# Patient Record
Sex: Female | Born: 1980 | Race: Black or African American | Hispanic: No | Marital: Single | Smoking: Never smoker
Health system: Southern US, Community
[De-identification: ages and names within clinical notes are randomized; demographics above are authoritative.]

## PROBLEM LIST (undated history)

## (undated) DIAGNOSIS — G629 Polyneuropathy, unspecified: Secondary | ICD-10-CM

## (undated) DIAGNOSIS — R000 Tachycardia, unspecified: Secondary | ICD-10-CM

## (undated) DIAGNOSIS — Q2730 Arteriovenous malformation, site unspecified: Secondary | ICD-10-CM

## (undated) DIAGNOSIS — E119 Type 2 diabetes mellitus without complications: Secondary | ICD-10-CM

## (undated) DIAGNOSIS — J189 Pneumonia, unspecified organism: Secondary | ICD-10-CM

## (undated) DIAGNOSIS — Z87442 Personal history of urinary calculi: Secondary | ICD-10-CM

## (undated) DIAGNOSIS — I499 Cardiac arrhythmia, unspecified: Secondary | ICD-10-CM

## (undated) DIAGNOSIS — D649 Anemia, unspecified: Secondary | ICD-10-CM

## (undated) HISTORY — PX: TUBAL LIGATION: SHX77

## (undated) HISTORY — PX: BREAST SURGERY: SHX581

## (undated) HISTORY — PX: ESOPHAGOGASTRODUODENOSCOPY: SHX1529

## (undated) HISTORY — PX: ABDOMINAL SURGERY: SHX537

## (undated) NOTE — *Deleted (*Deleted)
  81 y.o. female presents to skilled physical therapy due to RLE weakness. Patient's symptoms started June/July 2020 and started intermittently but are not constant. Symptoms include right leg weakness and paresthesias in both legs. She describes her sensory symptoms as burning, tingling, pin and needles, weakness, numbness, aching, stabbing, and pain. She feels that the numbness begins below her right knee and runs along the medial portion of her calf and ends at her toes. She reports that the numbness has been worsening over the last few weeks. She went to the ED on 11/05/2019 due to these symptoms and she was prescribed a steroid taper. Since that visit on May 7, she feels that she has been having to "drag" her right leg while walking, and also that she is afraid to walk due to having "no" sensation to her right foot. In May 2021 she had periods of urinary incontinence. She also reports that she did not feel toilet paper when she is wiping after urinating, prompting her to have to get into the shower on more than one occasion to ensure that she is clean. Denies any bowel dysfunction. Today in office patient reports that urinary incontinence and decreased urinary sensation. Patient has tried gabapentin for her symptoms. Patient denies being diagnosed wit hypothyroidism, renal failure, any known nutritional deficiencies, or any autoimmunes diseases. MRI conducted on 05/14/20 is positive for mild disc herniations at C3-4, C4-5, and C5-6 resulting in mild  spinal stenosis.

---

## 2004-04-11 ENCOUNTER — Emergency Department: Payer: Self-pay | Admitting: General Practice

## 2004-04-11 ENCOUNTER — Other Ambulatory Visit: Payer: Self-pay

## 2004-06-29 ENCOUNTER — Emergency Department: Payer: Self-pay | Admitting: Emergency Medicine

## 2004-10-23 ENCOUNTER — Emergency Department: Payer: Self-pay | Admitting: Emergency Medicine

## 2004-10-24 ENCOUNTER — Emergency Department: Payer: Self-pay | Admitting: Emergency Medicine

## 2005-01-12 ENCOUNTER — Emergency Department: Payer: Self-pay | Admitting: Unknown Physician Specialty

## 2005-02-06 ENCOUNTER — Emergency Department: Payer: Self-pay | Admitting: Emergency Medicine

## 2005-05-18 ENCOUNTER — Emergency Department: Payer: Self-pay | Admitting: Emergency Medicine

## 2005-08-03 ENCOUNTER — Emergency Department: Payer: Self-pay | Admitting: Unknown Physician Specialty

## 2007-04-03 ENCOUNTER — Emergency Department: Payer: Self-pay | Admitting: Emergency Medicine

## 2007-07-10 ENCOUNTER — Emergency Department: Payer: Self-pay | Admitting: Internal Medicine

## 2007-11-28 ENCOUNTER — Emergency Department: Payer: Self-pay | Admitting: Unknown Physician Specialty

## 2007-12-22 ENCOUNTER — Emergency Department: Payer: Self-pay | Admitting: Internal Medicine

## 2008-03-21 ENCOUNTER — Emergency Department: Payer: Self-pay | Admitting: Emergency Medicine

## 2008-05-05 ENCOUNTER — Emergency Department: Payer: Self-pay | Admitting: Internal Medicine

## 2008-08-01 ENCOUNTER — Encounter (INDEPENDENT_AMBULATORY_CARE_PROVIDER_SITE_OTHER): Payer: Self-pay | Admitting: Specialist

## 2008-08-01 ENCOUNTER — Ambulatory Visit (HOSPITAL_BASED_OUTPATIENT_CLINIC_OR_DEPARTMENT_OTHER): Admission: RE | Admit: 2008-08-01 | Discharge: 2008-08-02 | Payer: Self-pay | Admitting: Specialist

## 2009-02-04 ENCOUNTER — Emergency Department: Payer: Self-pay | Admitting: Emergency Medicine

## 2009-02-20 ENCOUNTER — Emergency Department: Payer: Self-pay | Admitting: Emergency Medicine

## 2009-03-22 ENCOUNTER — Emergency Department: Payer: Self-pay | Admitting: Emergency Medicine

## 2009-06-29 ENCOUNTER — Emergency Department: Payer: Self-pay | Admitting: Emergency Medicine

## 2009-07-31 ENCOUNTER — Emergency Department: Payer: Self-pay | Admitting: Emergency Medicine

## 2009-09-17 ENCOUNTER — Emergency Department: Payer: Self-pay | Admitting: Emergency Medicine

## 2009-10-23 ENCOUNTER — Emergency Department: Payer: Self-pay | Admitting: Internal Medicine

## 2009-12-22 ENCOUNTER — Emergency Department: Payer: Self-pay | Admitting: Emergency Medicine

## 2010-06-07 ENCOUNTER — Emergency Department: Payer: Self-pay

## 2010-07-30 ENCOUNTER — Emergency Department: Payer: Self-pay | Admitting: Emergency Medicine

## 2010-10-16 LAB — POCT HEMOGLOBIN-HEMACUE: Hemoglobin: 10.2 g/dL — ABNORMAL LOW (ref 12.0–15.0)

## 2010-11-13 NOTE — Op Note (Signed)
Mary Velasquez, Mary Velasquez                ACCOUNT NO.:  [id number]   MEDICAL RECORD NO.:  [id number]          PATIENT TYPE:  AMB   LOCATION:  [facility]                          FACILITY:  [facility]   PHYSICIAN:  Earvin Hansen L. Truesdale, M.D.DATE OF BIRTH:  05-May-1981   DATE OF PROCEDURE:  DATE OF DISCHARGE:                               OPERATIVE REPORT   A 30 year old lady with severe macromastia, back and shoulder pain,  secondary to large pendulous breasts with intertriginous changes,  resistant to conservative treatment, history of accessory breast tissue  bilaterally.   PROCEDURES:  Bilateral breast reductions using the inferior pedicle  reduction mammoplasty.   ANESTHESIA:  General.   DESCRIPTION OF PROCEDURE:  Preoperatively, the patient was set up and  drawn for the inferior pedicle reduction mammoplasty, re-marked the  nipple-areola complexes to 22 cm from 38 both sides.  She underwent  general anesthesia, intubated orally.  Prep was done to the chest,  breast areas in a routine fashion using Hibiclens soap and solution,  walled off with sterile towels and drapes so as to make a sterile field.  A 0.25% Xylocaine with epinephrine was injected, 150 mL per side  1:400,000 concentration.  The wounds were scored with #15 blade.  The  skin over the inferior pedicle was de-epithelialized with #20 blade.  Medial and lateral fatty dermal pedicles were excised underlying  pectoralis major fascia.  Laterally more breast tissue was removed and  accessory in nature.  After proper hemostasis, the new keyhole area was  debulked, flaps were transposed and stayed with 3-0 Prolene.  Subcutaneous closure was done with 3-0 Monocryl x2 layers, then run the  subcuticular stitch with 3-0 Monocryl and 5-0 Monocryl throughout the  inverted T.  The wounds were drained with #10 Foley fluted Blake drain,  which was placed in the side of the depths of the lateral portion  incision and secured with 3-0 Prolene.  The  wounds were cleansed.  The  nipple-areola complexes were examined, showing good suppleness and blood  supply.  The dressing were then placed Xeroform, 4 x 4s, ABDs, and  hyperfix tape.  She withstood the procedures very well.   ESTIMATED BLOOD LOSS:  200 mL.   COMPLICATIONS:  None.   We removed over 1400 g on the right side and over 1100 g on the left.  She was then taken to recovery in good condition.      Yaakov Guthrie. Shon Hough, M.D.  Electronically Signed     GLT/MEDQ  D:  08/01/2008  T:  08/02/2008  Job:  [id number]

## 2011-02-13 ENCOUNTER — Emergency Department: Payer: Self-pay | Admitting: *Deleted

## 2011-04-09 ENCOUNTER — Emergency Department: Payer: Self-pay | Admitting: Emergency Medicine

## 2011-08-11 ENCOUNTER — Emergency Department: Payer: Self-pay | Admitting: Internal Medicine

## 2013-11-11 ENCOUNTER — Emergency Department: Payer: Self-pay | Admitting: Emergency Medicine

## 2013-11-11 LAB — CBC WITH DIFFERENTIAL/PLATELET
Basophil #: 0 10*3/uL (ref 0.0–0.1)
Basophil %: 0.8 %
EOS ABS: 0.1 10*3/uL (ref 0.0–0.7)
Eosinophil %: 1.5 %
HCT: 37.3 % (ref 35.0–47.0)
HGB: 12.3 g/dL (ref 12.0–16.0)
LYMPHS PCT: 26.6 %
Lymphocyte #: 1.6 10*3/uL (ref 1.0–3.6)
MCH: 25.7 pg — AB (ref 26.0–34.0)
MCHC: 33 g/dL (ref 32.0–36.0)
MCV: 78 fL — AB (ref 80–100)
MONO ABS: 0.6 x10 3/mm (ref 0.2–0.9)
Monocyte %: 9.1 %
NEUTROS ABS: 3.8 10*3/uL (ref 1.4–6.5)
NEUTROS PCT: 62 %
PLATELETS: 211 10*3/uL (ref 150–440)
RBC: 4.8 10*6/uL (ref 3.80–5.20)
RDW: 16 % — ABNORMAL HIGH (ref 11.5–14.5)
WBC: 6.1 10*3/uL (ref 3.6–11.0)

## 2013-11-11 LAB — URINALYSIS, COMPLETE
BLOOD: NEGATIVE
Bacteria: NONE SEEN
Bilirubin,UR: NEGATIVE
Glucose,UR: >=500 mg/dL (ref 0–75)
Ketone: NEGATIVE
Leukocyte Esterase: NEGATIVE
Nitrite: NEGATIVE
PROTEIN: NEGATIVE
Ph: 5 (ref 4.5–8.0)
RBC, UR: NONE SEEN /HPF (ref 0–5)
Specific Gravity: 1.032 (ref 1.003–1.030)
WBC UR: 1 /HPF (ref 0–5)

## 2013-11-11 LAB — COMPREHENSIVE METABOLIC PANEL
ALK PHOS: 101 U/L
Albumin: 3.1 g/dL — ABNORMAL LOW (ref 3.4–5.0)
Anion Gap: 6 — ABNORMAL LOW (ref 7–16)
BILIRUBIN TOTAL: 0.2 mg/dL (ref 0.2–1.0)
BUN: 9 mg/dL (ref 7–18)
CALCIUM: 9 mg/dL (ref 8.5–10.1)
Chloride: 104 mmol/L (ref 98–107)
Co2: 27 mmol/L (ref 21–32)
Creatinine: 0.62 mg/dL (ref 0.60–1.30)
EGFR (African American): >60
EGFR (Non-African Amer.): >60
GLUCOSE: 284 mg/dL — AB (ref 65–99)
Osmolality: 283 (ref 275–301)
Potassium: 3.9 mmol/L (ref 3.5–5.1)
SGOT(AST): 8 U/L — ABNORMAL LOW (ref 15–37)
SGPT (ALT): 15 U/L (ref 12–78)
SODIUM: 137 mmol/L (ref 136–145)
TOTAL PROTEIN: 7.9 g/dL (ref 6.4–8.2)

## 2013-11-11 LAB — LIPASE, BLOOD: Lipase: 167 U/L (ref 73–393)

## 2014-03-22 ENCOUNTER — Emergency Department: Payer: Self-pay | Admitting: Emergency Medicine

## 2014-03-22 LAB — URINALYSIS, COMPLETE
Bacteria: NONE SEEN
Bilirubin,UR: NEGATIVE
Blood: NEGATIVE
LEUKOCYTE ESTERASE: NEGATIVE
Nitrite: NEGATIVE
Ph: 5 (ref 4.5–8.0)
Protein: 25
RBC,UR: 2 /HPF (ref 0–5)
Specific Gravity: 1.025 (ref 1.003–1.030)

## 2014-03-22 LAB — COMPREHENSIVE METABOLIC PANEL
ALK PHOS: 83 U/L
ALT: 13 U/L — AB
Albumin: 3 g/dL — ABNORMAL LOW (ref 3.4–5.0)
Anion Gap: 8 (ref 7–16)
BUN: 8 mg/dL (ref 7–18)
Bilirubin,Total: 0.3 mg/dL (ref 0.2–1.0)
CHLORIDE: 105 mmol/L (ref 98–107)
CO2: 24 mmol/L (ref 21–32)
CREATININE: 0.64 mg/dL (ref 0.60–1.30)
Calcium, Total: 8.7 mg/dL (ref 8.5–10.1)
EGFR (African American): >60
EGFR (Non-African Amer.): >60
Glucose: 250 mg/dL — ABNORMAL HIGH (ref 65–99)
OSMOLALITY: 281 (ref 275–301)
Potassium: 3.5 mmol/L (ref 3.5–5.1)
SGOT(AST): 10 U/L — ABNORMAL LOW (ref 15–37)
SODIUM: 137 mmol/L (ref 136–145)
TOTAL PROTEIN: 7.1 g/dL (ref 6.4–8.2)

## 2014-03-22 LAB — CBC WITH DIFFERENTIAL/PLATELET
BASOS ABS: 0 10*3/uL (ref 0.0–0.1)
Basophil %: 0.6 %
EOS ABS: 0.1 10*3/uL (ref 0.0–0.7)
Eosinophil %: 1.9 %
HCT: 35.9 % (ref 35.0–47.0)
HGB: 11.2 g/dL — ABNORMAL LOW (ref 12.0–16.0)
LYMPHS ABS: 2.3 10*3/uL (ref 1.0–3.6)
Lymphocyte %: 31.9 %
MCH: 24.3 pg — AB (ref 26.0–34.0)
MCHC: 31.2 g/dL — ABNORMAL LOW (ref 32.0–36.0)
MCV: 78 fL — ABNORMAL LOW (ref 80–100)
MONOS PCT: 10 %
Monocyte #: 0.7 x10 3/mm (ref 0.2–0.9)
NEUTROS ABS: 3.9 10*3/uL (ref 1.4–6.5)
Neutrophil %: 55.6 %
PLATELETS: 222 10*3/uL (ref 150–440)
RBC: 4.6 10*6/uL (ref 3.80–5.20)
RDW: 14.9 % — AB (ref 11.5–14.5)
WBC: 7.1 10*3/uL (ref 3.6–11.0)

## 2014-03-22 LAB — TROPONIN I

## 2014-03-22 LAB — GC/CHLAMYDIA PROBE AMP

## 2014-03-22 LAB — WET PREP, GENITAL

## 2014-03-22 LAB — LIPASE, BLOOD: LIPASE: 113 U/L (ref 73–393)

## 2015-06-22 ENCOUNTER — Emergency Department
Admission: EM | Admit: 2015-06-22 | Discharge: 2015-06-22 | Disposition: A | Discharge status: Home / Self Care | Payer: Self-pay | Attending: Emergency Medicine | Admitting: Emergency Medicine

## 2015-06-22 ENCOUNTER — Encounter: Payer: Self-pay | Admitting: Emergency Medicine

## 2015-06-22 DIAGNOSIS — M5432 Sciatica, left side: Secondary | ICD-10-CM

## 2015-06-22 DIAGNOSIS — E119 Type 2 diabetes mellitus without complications: Secondary | ICD-10-CM | POA: Insufficient documentation

## 2015-06-22 DIAGNOSIS — M5442 Lumbago with sciatica, left side: Secondary | ICD-10-CM | POA: Insufficient documentation

## 2015-06-22 HISTORY — DX: Type 2 diabetes mellitus without complications: E11.9

## 2015-06-22 MED ORDER — TRAMADOL HCL 50 MG PO TABS: 50.0000 mg | ORAL_TABLET | Freq: Four times a day (QID) | ORAL | Status: DC | PRN

## 2015-06-22 MED ORDER — METHYLPREDNISOLONE 4 MG PO TBPK: ORAL_TABLET | ORAL | Status: DC

## 2015-06-22 MED ORDER — METHOCARBAMOL 750 MG PO TABS: 1500.0000 mg | ORAL_TABLET | Freq: Four times a day (QID) | ORAL | Status: DC

## 2015-06-22 NOTE — ED Notes (Signed)
Pt with lef lower back and leg pain for two mths, thinks it may be her sciatica.

## 2015-06-22 NOTE — ED Provider Notes (Signed)
[facility] Emergency Department Provider Note  ____________________________________________  Time seen: Approximately 6:01 PM  I have reviewed the triage vital signs and the nursing notes.   HISTORY  Chief Complaint Leg Pain    HPI Mary Velasquez is a 34 y.o. female patient complain intermitting radicular low back pain to the left lower extremity for 2 months but worsened in the last 6 days.. Patient states denies any bladder or bowel dysfunction. 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 Patient stated there is no definitive provocative incident for this complaint. No palliative measures taken for this complaint.She does rate her pain as a 5/10. Patient described the pain as sharp progressing to intermitting numbness.   Past Medical History  Diagnosis Date  . Diabetes mellitus without complication (HCC)     There are no active problems to display for this patient.   Past Surgical History  Procedure Laterality Date  . Abdominal surgery    . Breast surgery      Current Outpatient Rx  Name  Route  Sig  Dispense  Refill  . methocarbamol (ROBAXIN-750) 750 MG tablet   Oral   Take 2 tablets (1,500 mg total) by mouth 4 (four) times daily.   40 tablet   0   . methylPREDNISolone (MEDROL DOSEPAK) 4 MG TBPK tablet      Take Tapered dose as directed   21 tablet   0   . traMADol (ULTRAM) 50 MG tablet   Oral   Take 1 tablet (50 mg total) by mouth every 6 (six) hours as needed for moderate pain.   12 tablet   0     Allergies Oxycodone  No family history on file.  Social History Social History  Substance Use Topics  . Smoking status: Never Smoker   . Smokeless tobacco: None  . Alcohol Use: No    Review of Systems Constitutional: No fever/chills Eyes: No visual changes. ENT: No sore throat. Cardiovascular: Denies chest pain. Respiratory: Denies shortness of breath. Gastrointestinal: No  abdominal pain.  No nausea, no vomiting.  No diarrhea.  No constipation. Genitourinary: Negative for dysuria. Musculoskeletal: Back pain  Skin: Negative for rash. Neurological: Negative for headaches, focal weakness or numbness. Endocrine:Diabetes but no medication secondary to weight loss that controlled his condition. Hematological/Lymphatic: Allergic/Immunilogical: Oxycodone  10-point ROS otherwise negative.  ____________________________________________   PHYSICAL EXAM:  VITAL SIGNS: ED Triage Vitals  Enc Vitals Group     BP 06/22/15 1750 129/82 mmHg     Pulse Rate 06/22/15 1750 100     Resp 06/22/15 1750 18     Temp 06/22/15 1750 98.2 F (36.8 C)     Temp Source 06/22/15 1750 Oral     SpO2 06/22/15 1750 98 %     Weight 06/22/15 1750 198 lb (89.812 kg)     Height 06/22/15 1750 5' (1.524 m)     Head Cir --      Peak Flow --      Pain Score 06/22/15 1753 5     Pain Loc --      Pain Edu? --      Excl. in GC? --     Constitutional: Alert and oriented. Well appearing and in no acute distress. Eyes: Conjunctivae are normal. PERRL. EOMI. Head: Atraumatic. Nose: No congestion/rhinnorhea. Mouth/Throat: Mucous membranes are moist.  Oropharynx non-erythematous. Neck: No stridor.  No cervical spine tenderness to palpation. Hematological/Lymphatic/Immunilogical: No cervical lymphadenopathy. Cardiovascular: Normal rate,  regular rhythm. Grossly normal heart sounds.  Good peripheral circulation. Respiratory: Normal respiratory effort.  No retractions. Lungs CTAB. Gastrointestinal: Soft and nontender. No distention. No abdominal bruits. No CVA tenderness. Musculoskeletal: No lower extremity tenderness nor edema.  No joint effusions. Neurologic:  Normal speech and language. No gross focal neurologic deficits are appreciated. No gait instability. Skin:  Skin is warm, dry and intact. No rash noted. Psychiatric: Mood and affect are normal. Speech and behavior are  normal.  ____________________________________________   LABS (all labs ordered are listed, but only abnormal results are displayed)  Labs Reviewed - No data to display ____________________________________________  EKG   ____________________________________________  RADIOLOGY   ____________________________________________   PROCEDURES  Procedure(s) performed: None  Critical Care performed: No  ____________________________________________   INITIAL IMPRESSION / ASSESSMENT AND PLAN / ED COURSE  Pertinent labs & imaging results that were available during my care of the patient were reviewed by me and considered in my medical decision making (see chart for details).  Radicular back pain. Patient discussion on home care. Patient get a prescription for prednisone, Robaxin, and tramadol. Patient advised follow-up with her family doctor one week ago and noticed no improvement. ____________________________________________   FINAL CLINICAL IMPRESSION(S) / ED DIAGNOSES  Final diagnoses:  Sciatica associated with disorder of lumbar spine, left      Joni Reining, PA-C 06/22/15 1821  Arnaldo Natal, MD 06/23/15 [phone]

## 2015-08-10 ENCOUNTER — Emergency Department
Admission: EM | Admit: 2015-08-10 | Discharge: 2015-08-10 | Disposition: A | Discharge status: Home / Self Care | Payer: Self-pay | Attending: Emergency Medicine | Admitting: Emergency Medicine

## 2015-08-10 ENCOUNTER — Encounter: Payer: Self-pay | Admitting: Emergency Medicine

## 2015-08-10 DIAGNOSIS — E1165 Type 2 diabetes mellitus with hyperglycemia: Secondary | ICD-10-CM | POA: Insufficient documentation

## 2015-08-10 DIAGNOSIS — A0811 Acute gastroenteropathy due to Norwalk agent: Secondary | ICD-10-CM | POA: Insufficient documentation

## 2015-08-10 DIAGNOSIS — Z3202 Encounter for pregnancy test, result negative: Secondary | ICD-10-CM | POA: Insufficient documentation

## 2015-08-10 LAB — LIPASE, BLOOD: LIPASE: 38 U/L (ref 11–51)

## 2015-08-10 LAB — COMPREHENSIVE METABOLIC PANEL
ALBUMIN: 3.7 g/dL (ref 3.5–5.0)
ALT: 13 U/L — ABNORMAL LOW (ref 14–54)
AST: 14 U/L — AB (ref 15–41)
Alkaline Phosphatase: 92 U/L (ref 38–126)
Anion gap: 7 (ref 5–15)
BUN: 11 mg/dL (ref 6–20)
CHLORIDE: 101 mmol/L (ref 101–111)
CO2: 27 mmol/L (ref 22–32)
Calcium: 9.1 mg/dL (ref 8.9–10.3)
Creatinine, Ser: 0.51 mg/dL (ref 0.44–1.00)
GFR calc Af Amer: >60 mL/min (ref >60)
GFR calc non Af Amer: >60 mL/min (ref >60)
GLUCOSE: 307 mg/dL — AB (ref 65–99)
POTASSIUM: 3.9 mmol/L (ref 3.5–5.1)
Sodium: 135 mmol/L (ref 135–145)
Total Bilirubin: 0.5 mg/dL (ref 0.3–1.2)
Total Protein: 7.8 g/dL (ref 6.5–8.1)

## 2015-08-10 LAB — URINALYSIS COMPLETE WITH MICROSCOPIC (ARMC ONLY)
BACTERIA UA: NONE SEEN
Bilirubin Urine: NEGATIVE
Glucose, UA: >500 mg/dL — AB
HGB URINE DIPSTICK: NEGATIVE
Ketones, ur: NEGATIVE mg/dL
NITRITE: NEGATIVE
PH: 6 (ref 5.0–8.0)
PROTEIN: NEGATIVE mg/dL
SPECIFIC GRAVITY, URINE: 1.027 (ref 1.005–1.030)

## 2015-08-10 LAB — CBC
HEMATOCRIT: 36.9 % (ref 35.0–47.0)
Hemoglobin: 11.9 g/dL — ABNORMAL LOW (ref 12.0–16.0)
MCH: 25.3 pg — AB (ref 26.0–34.0)
MCHC: 32.3 g/dL (ref 32.0–36.0)
MCV: 78.4 fL — AB (ref 80.0–100.0)
Platelets: 231 10*3/uL (ref 150–440)
RBC: 4.7 MIL/uL (ref 3.80–5.20)
RDW: 15.1 % — AB (ref 11.5–14.5)
WBC: 6.2 10*3/uL (ref 3.6–11.0)

## 2015-08-10 LAB — PREGNANCY, URINE: PREG TEST UR: NEGATIVE

## 2015-08-10 MED ORDER — METFORMIN HCL 500 MG PO TABS
500.0000 mg | ORAL_TABLET | Freq: Once | ORAL | Status: AC
  Administered 2015-08-10: 500 mg via ORAL

## 2015-08-10 MED ORDER — ONDANSETRON 4 MG PO TBDP
ORAL_TABLET | ORAL | Status: AC
  Administered 2015-08-10: 4 mg via ORAL
  Filled 2015-08-10: qty 1

## 2015-08-10 MED ORDER — METFORMIN HCL 500 MG PO TABS
ORAL_TABLET | ORAL | Status: AC
Start: 2015-08-10 — End: 2015-08-10
  Administered 2015-08-10: 500 mg via ORAL
  Filled 2015-08-10: qty 1

## 2015-08-10 MED ORDER — ONDANSETRON HCL 4 MG PO TABS: 4.0000 mg | ORAL_TABLET | Freq: Every day | ORAL | Status: DC | PRN

## 2015-08-10 MED ORDER — METFORMIN HCL 500 MG PO TABS: 500.0000 mg | ORAL_TABLET | Freq: Two times a day (BID) | ORAL | Status: DC

## 2015-08-10 MED ORDER — ONDANSETRON 4 MG PO TBDP
4.0000 mg | ORAL_TABLET | Freq: Once | ORAL | Status: AC
  Administered 2015-08-10: 4 mg via ORAL

## 2015-08-10 NOTE — ED Provider Notes (Signed)
[facility] Emergency Department Provider Note     Time seen: ----------------------------------------- 2:02 PM on 08/10/2015 -----------------------------------------    I have reviewed the triage vital signs and the nursing notes.   HISTORY  Chief Complaint Abdominal Pain    HPI Mary Velasquez is a 35 y.o. female who presents to ER with bilateral lower abdominal pain, nausea and vomiting since last night. Patient reports a fever 101.3 last night. She is also had diarrhea, she reports last taking Tylenol at 7:00 this morning. Patient does have diabetes but had stopped taking her medication after she lost a lot of weight, she denies any other complaints.   Past Medical History  Diagnosis Date  . Diabetes mellitus without complication (HCC)     There are no active problems to display for this patient.   Past Surgical History  Procedure Laterality Date  . Abdominal surgery    . Breast surgery    . Cesarean section    . Tubal ligation      Allergies Oxycodone  Social History Social History  Substance Use Topics  . Smoking status: Never Smoker   . Smokeless tobacco: None  . Alcohol Use: No    Review of Systems Constitutional: Positive for fever Eyes: Negative for visual changes. ENT: Negative for sore throat. Cardiovascular: Negative for chest pain. Respiratory: Negative for shortness of breath. Gastrointestinal: Positive for abdominal pain earlier, vomiting and diarrhea Genitourinary: Negative for dysuria. Musculoskeletal: Negative for back pain. Skin: Negative for rash. Neurological: Negative for headaches, focal weakness or numbness.  10-point ROS otherwise negative.  ____________________________________________   PHYSICAL EXAM:  VITAL SIGNS: ED Triage Vitals  Enc Vitals Group     BP 08/10/15 1123 146/113 mmHg     Pulse Rate 08/10/15 1123 86     Resp 08/10/15 1123 20     Temp 08/10/15 1123 97.8 F (36.6 C)     Temp  Source 08/10/15 1123 Oral     SpO2 08/10/15 1123 99 %     Weight 08/10/15 1123 198 lb (89.812 kg)     Height 08/10/15 1123 5' (1.524 m)     Head Cir --      Peak Flow --      Pain Score 08/10/15 1144 6     Pain Loc --      Pain Edu? --      Excl. in GC? --     Constitutional: Alert and oriented. Well appearing and in no distress. Eyes: Conjunctivae are normal. PERRL. Normal extraocular movements. ENT   Head: Normocephalic and atraumatic.   Nose: No congestion/rhinnorhea.   Mouth/Throat: Mucous membranes are moist.   Neck: No stridor. Cardiovascular: Normal rate, regular rhythm. Normal and symmetric distal pulses are present in all extremities. No murmurs, rubs, or gallops. Respiratory: Normal respiratory effort without tachypnea nor retractions. Breath sounds are clear and equal bilaterally. No wheezes/rales/rhonchi. Gastrointestinal: Soft and nontender. No distention. No abdominal bruits.  Musculoskeletal: Nontender with normal range of motion in all extremities. No joint effusions.  No lower extremity tenderness nor edema. Neurologic:  Normal speech and language. No gross focal neurologic deficits are appreciated. Speech is normal. No gait instability. Skin:  Skin is warm, dry and intact. No rash noted. Psychiatric: Mood and affect are normal. Speech and behavior are normal. Patient exhibits appropriate insight and judgment. ____________________________________________  ED COURSE:  Pertinent labs & imaging results that were available during my care of the patient were reviewed by me and considered in my  medical decision making (see chart for details). Patients in no acute distress, likely Norovirus. She has a benign exam. No abdominal tenderness. ____________________________________________    LABS (pertinent positives/negatives)  Labs Reviewed  COMPREHENSIVE METABOLIC PANEL - Abnormal; Notable for the following:    Glucose, Bld 307 (*)    AST 14 (*)    ALT 13  (*)    All other components within normal limits  CBC - Abnormal; Notable for the following:    Hemoglobin 11.9 (*)    MCV 78.4 (*)    MCH 25.3 (*)    RDW 15.1 (*)    All other components within normal limits  URINALYSIS COMPLETEWITH MICROSCOPIC ([facility] ONLY) - Abnormal; Notable for the following:    Color, Urine STRAW (*)    APPearance CLEAR (*)    Glucose, UA >500 (*)    Leukocytes, UA TRACE (*)    Squamous Epithelial / LPF 0-5 (*)    All other components within normal limits  LIPASE, BLOOD  PREGNANCY, URINE  ____________________________________________  FINAL ASSESSMENT AND PLAN  Norovirus, type 2 diabetes  Plan: Patient with labs and imaging as dictated above. Patient is exam is reassuring, labs are unremarkable except for hyperglycemia. I will restart her metformin and she'll be given Zofran as needed for nausea and vomiting.   Emily Filbert, MD   Emily Filbert, MD 08/10/15 [phone]

## 2015-08-10 NOTE — Discharge Instructions (Signed)
Hyperglycemia °Hyperglycemia occurs when the glucose (sugar) in your blood is too high. Hyperglycemia can happen for many reasons, but it most often happens to people who do not know they have diabetes or are not managing their diabetes properly.  °CAUSES  °Whether you have diabetes or not, there are other causes of hyperglycemia. Hyperglycemia can occur when you have diabetes, but it can also occur in other situations that you might not be as aware of, such as: °Diabetes °· If you have diabetes and are having problems controlling your blood glucose, hyperglycemia could occur because of some of the following reasons: °· Not following your meal plan. °· Not taking your diabetes medications or not taking it properly. °· Exercising less or doing less activity than you normally do. °· Being sick. °Pre-diabetes °· This cannot be ignored. Before people develop Type 2 diabetes, they almost always have "pre-diabetes." This is when your blood glucose levels are higher than normal, but not yet high enough to be diagnosed as diabetes. Research has shown that some long-term damage to the body, especially the heart and circulatory system, may already be occurring during pre-diabetes. If you take action to manage your blood glucose when you have pre-diabetes, you may delay or prevent Type 2 diabetes from developing. °Stress °· If you have diabetes, you may be "diet" controlled or on oral medications or insulin to control your diabetes. However, you may find that your blood glucose is higher than usual in the hospital whether you have diabetes or not. This is often referred to as "stress hyperglycemia." Stress can elevate your blood glucose. This happens because of hormones put out by the body during times of stress. If stress has been the cause of your high blood glucose, it can be followed regularly by your caregiver. That way he/she can make sure your hyperglycemia does not continue to get worse or progress to  diabetes. °Steroids °· Steroids are medications that act on the infection fighting system (immune system) to block inflammation or infection. One side effect can be a rise in blood glucose. Most people can produce enough extra insulin to allow for this rise, but for those who cannot, steroids make blood glucose levels go even higher. It is not unusual for steroid treatments to "uncover" diabetes that is developing. It is not always possible to determine if the hyperglycemia will go away after the steroids are stopped. A special blood test called an A1c is sometimes done to determine if your blood glucose was elevated before the steroids were started. °SYMPTOMS °· Thirsty. °· Frequent urination. °· Dry mouth. °· Blurred vision. °· Tired or fatigue. °· Weakness. °· Sleepy. °· Tingling in feet or leg. °DIAGNOSIS  °Diagnosis is made by monitoring blood glucose in one or all of the following ways: °· A1c test. This is a chemical found in your blood. °· Fingerstick blood glucose monitoring. °· Laboratory results. °TREATMENT  °First, knowing the cause of the hyperglycemia is important before the hyperglycemia can be treated. Treatment may include, but is not be limited to: °· Education. °· Change or adjustment in medications. °· Change or adjustment in meal plan. °· Treatment for an illness, infection, etc. °· More frequent blood glucose monitoring. °· Change in exercise plan. °· Decreasing or stopping steroids. °· Lifestyle changes. °HOME CARE INSTRUCTIONS  °· Test your blood glucose as directed. °· Exercise regularly. Your caregiver will give you instructions about exercise. Pre-diabetes or diabetes which comes on with stress is helped by exercising. °· Eat wholesome,   balanced meals. Eat often and at regular, fixed times. Your caregiver or nutritionist will give you a meal plan to guide your sugar intake.  Being at an ideal weight is important. If needed, losing as little as 10 to 15 pounds may help improve blood  glucose levels. SEEK MEDICAL CARE IF:   You have questions about medicine, activity, or diet.  You continue to have symptoms (problems such as increased thirst, urination, or weight gain). SEEK IMMEDIATE MEDICAL CARE IF:   You are vomiting or have diarrhea.  Your breath smells fruity.  You are breathing faster or slower.  You are very sleepy or incoherent.  You have numbness, tingling, or pain in your feet or hands.  You have chest pain.  Your symptoms get worse even though you have been following your caregiver's orders.  If you have any other questions or concerns.   This information is not intended to replace advice given to you by your health care provider. Make sure you discuss any questions you have with your health care provider.   Document Released: 12/11/2000 Document Revised: 09/09/2011 Document Reviewed: 02/21/2015 Elsevier Interactive Patient Education 2016 [employer].  Norovirus Infection A norovirus infection is caused by exposure to a virus in a group of similar viruses (noroviruses). This type of infection causes inflammation in your stomach and intestines (gastroenteritis). Norovirus is the most common cause of gastroenteritis. It also causes food poisoning. Anyone can get a norovirus infection. It spreads very easily (contagious). You can get it from contaminated food, water, surfaces, or other people. Norovirus is found in the stool or vomit of infected people. You can spread the infection as soon as you feel sick until 2 weeks after you recover.  Symptoms usually begin within 2 days after you become infected. Most norovirus symptoms affect the digestive system. CAUSES Norovirus infection is caused by contact with norovirus. You can catch norovirus if you:  Eat or drink something contaminated with norovirus.  Touch surfaces or objects contaminated with norovirus and then put your hand in your mouth.  Have direct contact with an infected person who has  symptoms.  Share food, drink, or utensils with someone with who is sick with norovirus. SIGNS AND SYMPTOMS Symptoms of norovirus may include:  Nausea.  Vomiting.  Diarrhea.  Stomach cramps.  Fever.  Chills.  Headache.  Muscle aches.  Tiredness. DIAGNOSIS Your health care provider may suspect norovirus based on your symptoms and physical exam. Your health care provider may also test a sample of your stool or vomit for the virus.  TREATMENT There is no specific treatment for norovirus. Most people get better without treatment in about 2 days. HOME CARE INSTRUCTIONS  Replace lost fluids by drinking plenty of water or rehydration fluids containing important minerals called electrolytes. This prevents dehydration. Drink enough fluid to keep your urine clear or pale yellow.  Do not prepare food for others while you are infected. Wait at least 3 days after recovering from the illness to do that. PREVENTION   Wash your hands often, especially after using the toilet or changing a diaper.  Wash fruits and vegetables thoroughly before preparing or serving them.  Throw out any food that a sick person may have touched.  Disinfect contaminated surfaces immediately after someone in the household has been sick. Use a bleach-based household cleaner.  Immediately remove and wash soiled clothes or sheets. SEEK MEDICAL CARE IF:  Your vomiting, diarrhea, and stomach pain is getting worse.  Your symptoms of norovirus  do not go away after 2-3 days. SEEK IMMEDIATE MEDICAL CARE IF:  You develop symptoms of dehydration that do not improve with fluid replacement. This may include:  Excessive sleepiness.  Lack of tears.  Dry mouth.  Dizziness when standing.  Weak pulse.   This information is not intended to replace advice given to you by your health care provider. Make sure you discuss any questions you have with your health care provider.   Document Released: 09/07/2002 Document  Revised: 07/08/2014 Document Reviewed: 11/25/2013 Elsevier Interactive Patient Education [employer]

## 2015-08-10 NOTE — ED Notes (Signed)
Pt reports bilateral lower abdominal pain, nausea, vomiting since last night. Reports fever 101.3 last night. Pt reports taking tylenol at 0700.

## 2017-02-14 DIAGNOSIS — Z79899 Other long term (current) drug therapy: Secondary | ICD-10-CM | POA: Insufficient documentation

## 2017-02-14 DIAGNOSIS — E1165 Type 2 diabetes mellitus with hyperglycemia: Secondary | ICD-10-CM | POA: Insufficient documentation

## 2017-02-14 DIAGNOSIS — Z7984 Long term (current) use of oral hypoglycemic drugs: Secondary | ICD-10-CM | POA: Insufficient documentation

## 2017-02-14 DIAGNOSIS — R609 Edema, unspecified: Secondary | ICD-10-CM | POA: Insufficient documentation

## 2017-02-14 NOTE — ED Triage Notes (Signed)
Pt in with co bilat lower leg edema since this, hx of the same a year ago and was put on lasix. No on lasix at this time, no other compliants, states edema improved since this am.

## 2017-02-15 ENCOUNTER — Emergency Department
Admission: EM | Admit: 2017-02-15 | Discharge: 2017-02-15 | Disposition: A | Discharge status: Home / Self Care | Payer: Self-pay | Attending: Emergency Medicine | Admitting: Emergency Medicine

## 2017-02-15 DIAGNOSIS — R609 Edema, unspecified: Secondary | ICD-10-CM

## 2017-02-15 DIAGNOSIS — R739 Hyperglycemia, unspecified: Secondary | ICD-10-CM

## 2017-02-15 LAB — COMPREHENSIVE METABOLIC PANEL
ALT: 11 U/L — ABNORMAL LOW (ref 14–54)
AST: 21 U/L (ref 15–41)
Albumin: 3.3 g/dL — ABNORMAL LOW (ref 3.5–5.0)
Alkaline Phosphatase: 80 U/L (ref 38–126)
Anion gap: 10 (ref 5–15)
BILIRUBIN TOTAL: 0.4 mg/dL (ref 0.3–1.2)
BUN: 17 mg/dL (ref 6–20)
CO2: 25 mmol/L (ref 22–32)
Calcium: 8.8 mg/dL — ABNORMAL LOW (ref 8.9–10.3)
Chloride: 103 mmol/L (ref 101–111)
Creatinine, Ser: 0.78 mg/dL (ref 0.44–1.00)
Glucose, Bld: 420 mg/dL — ABNORMAL HIGH (ref 65–99)
POTASSIUM: 3.7 mmol/L (ref 3.5–5.1)
Sodium: 138 mmol/L (ref 135–145)
TOTAL PROTEIN: 7.2 g/dL (ref 6.5–8.1)

## 2017-02-15 LAB — CBC
HCT: 35.2 % (ref 35.0–47.0)
Hemoglobin: 11.5 g/dL — ABNORMAL LOW (ref 12.0–16.0)
MCH: 26.2 pg (ref 26.0–34.0)
MCHC: 32.8 g/dL (ref 32.0–36.0)
MCV: 79.9 fL — ABNORMAL LOW (ref 80.0–100.0)
Platelets: 235 K/uL (ref 150–440)
RBC: 4.41 MIL/uL (ref 3.80–5.20)
RDW: 15 % — ABNORMAL HIGH (ref 11.5–14.5)
WBC: 7.4 K/uL (ref 3.6–11.0)

## 2017-02-15 MED ORDER — METFORMIN HCL 500 MG PO TABS: 500.0000 mg | ORAL_TABLET | Freq: Two times a day (BID) | ORAL | 2 refills | Status: DC

## 2017-02-15 MED ORDER — FUROSEMIDE 20 MG PO TABS: 20.0000 mg | ORAL_TABLET | Freq: Every day | ORAL | 0 refills | Status: DC

## 2017-02-15 NOTE — ED Provider Notes (Signed)
[facility] Emergency Department Provider Note  ____________________________________________   I have reviewed the triage vital signs and the nursing notes.   HISTORY  Chief Complaint Leg Swelling   History limited by: Not Limited   HPI Mary Velasquez is a 36 y.o. female who presents to the emergency department today because of concern for peripheral edema. The patient states that she has had issues with lower extremity swelling in the past. Recently it has gotten worse. It is accompanied by discomfort. By the time of my examination it has improved. The patient had a similar problem last year and was treated with lasix. Additionally the patient has a history of diabetes but states she is currently not taking any medication for it.    Past Medical History:  Diagnosis Date  . Diabetes mellitus without complication (HCC)     There are no active problems to display for this patient.   Past Surgical History:  Procedure Laterality Date  . ABDOMINAL SURGERY    . BREAST SURGERY    . CESAREAN SECTION    . TUBAL LIGATION      Prior to Admission medications   Medication Sig Start Date End Date Taking? Authorizing Provider  metFORMIN (GLUCOPHAGE) 500 MG tablet Take 1 tablet (500 mg total) by mouth 2 (two) times daily with a meal. 08/10/15   Emily Filbert, MD  methocarbamol (ROBAXIN-750) 750 MG tablet Take 2 tablets (1,500 mg total) by mouth 4 (four) times daily. 06/22/15   Joni Reining, PA-C  methylPREDNISolone (MEDROL DOSEPAK) 4 MG TBPK tablet Take Tapered dose as directed 06/22/15   Joni Reining, PA-C  ondansetron (ZOFRAN) 4 MG tablet Take 1 tablet (4 mg total) by mouth daily as needed for nausea or vomiting. 08/10/15   Emily Filbert, MD  traMADol (ULTRAM) 50 MG tablet Take 1 tablet (50 mg total) by mouth every 6 (six) hours as needed for moderate pain. 06/22/15   Joni Reining, PA-C    Allergies Oxycodone  No family history on  file.  Social History Social History  Substance Use Topics  . Smoking status: Never Smoker  . Smokeless tobacco: Not on file  . Alcohol use No    Review of Systems Constitutional: No fever/chills Eyes: No visual changes. ENT: No sore throat. Cardiovascular: Denies chest pain. Respiratory: Denies shortness of breath. Gastrointestinal: No abdominal pain.  No nausea, no vomiting.  No diarrhea.   Genitourinary: Negative for dysuria. Musculoskeletal: Positive for lower extremity swelling.  Skin: Negative for rash. Neurological: Negative for headaches, focal weakness or numbness.  ____________________________________________   PHYSICAL EXAM:  VITAL SIGNS: ED Triage Vitals  Enc Vitals Group     BP 02/14/17 2355 (!) 137/100     Pulse Rate 02/14/17 2355 (!) 103     Resp 02/14/17 2355 (!) 22     Temp 02/14/17 2355 98.5 F (36.9 C)     Temp Source 02/14/17 2355 Oral     SpO2 02/14/17 2355 100 %     Weight 02/14/17 2356 204 lb (92.5 kg)     Height --      Head Circumference --      Peak Flow --      Pain Score 02/14/17 2355 7   Constitutional: Alert and oriented. Well appearing and in no distress. Eyes: Conjunctivae are normal.  ENT   Head: Normocephalic and atraumatic.   Nose: No congestion/rhinnorhea.   Mouth/Throat: Mucous membranes are moist.   Neck: No stridor. Hematological/Lymphatic/Immunilogical:  No cervical lymphadenopathy. Cardiovascular: Normal rate, regular rhythm.  No murmurs, rubs, or gallops.  Respiratory: Normal respiratory effort without tachypnea nor retractions. Breath sounds are clear and equal bilaterally. No wheezes/rales/rhonchi. Gastrointestinal: Soft and non tender. No rebound. No guarding.  Genitourinary: Deferred Musculoskeletal: Normal range of motion in all extremities. Trace lower extremity edema.  Neurologic:  Normal speech and language. No gross focal neurologic deficits are appreciated.  Skin:  Skin is warm, dry and intact. No  rash noted. Psychiatric: Mood and affect are normal. Speech and behavior are normal. Patient exhibits appropriate insight and judgment.  ____________________________________________    LABS (pertinent positives/negatives)  Labs Reviewed  CBC - Abnormal; Notable for the following:       Result Value   Hemoglobin 11.5 (*)    MCV 79.9 (*)    RDW 15.0 (*)    All other components within normal limits  COMPREHENSIVE METABOLIC PANEL - Abnormal; Notable for the following:    Glucose, Bld 420 (*)    Calcium 8.8 (*)    Albumin 3.3 (*)    ALT 11 (*)    All other components within normal limits     ____________________________________________   EKG  None  ____________________________________________    RADIOLOGY  None  ____________________________________________   PROCEDURES  Procedures  ____________________________________________   INITIAL IMPRESSION / ASSESSMENT AND PLAN / ED COURSE  Pertinent labs & imaging results that were available during my care of the patient were reviewed by me and considered in my medical decision making (see chart for details).  Patient presented the emergency department today because of lower extremityswelling. Patient has trace edema.patient has had this in the past. Will plan on giving patient short course of Lasix. Discussed compression stockings. I did have a discussion with the patient about her blood sugar. Patient currently not on medications. Will plan on giving patient prescription for metformin.  ____________________________________________   FINAL CLINICAL IMPRESSION(S) / ED DIAGNOSES  Final diagnoses:  Peripheral edema  Hyperglycemia     Note: This dictation was prepared with Dragon dictation. Any transcriptional errors that result from this process are unintentional     Phineas Semen, MD 02/15/17 [phone]

## 2017-02-15 NOTE — Discharge Instructions (Signed)
Please seek medical attention for any high fevers, chest pain, shortness of breath, change in behavior, persistent vomiting, bloody stool or any other new or concerning symptoms.  

## 2017-12-19 ENCOUNTER — Emergency Department
Admission: EM | Admit: 2017-12-19 | Discharge: 2017-12-19 | Disposition: A | Discharge status: Home / Self Care | Payer: Self-pay | Attending: Emergency Medicine | Admitting: Emergency Medicine

## 2017-12-19 ENCOUNTER — Encounter: Payer: Self-pay | Admitting: Emergency Medicine

## 2017-12-19 ENCOUNTER — Other Ambulatory Visit: Payer: Self-pay

## 2017-12-19 DIAGNOSIS — R110 Nausea: Secondary | ICD-10-CM | POA: Insufficient documentation

## 2017-12-19 DIAGNOSIS — Z79899 Other long term (current) drug therapy: Secondary | ICD-10-CM | POA: Insufficient documentation

## 2017-12-19 DIAGNOSIS — N390 Urinary tract infection, site not specified: Secondary | ICD-10-CM | POA: Insufficient documentation

## 2017-12-19 DIAGNOSIS — R5381 Other malaise: Secondary | ICD-10-CM | POA: Insufficient documentation

## 2017-12-19 DIAGNOSIS — E119 Type 2 diabetes mellitus without complications: Secondary | ICD-10-CM | POA: Insufficient documentation

## 2017-12-19 DIAGNOSIS — R1030 Lower abdominal pain, unspecified: Secondary | ICD-10-CM

## 2017-12-19 DIAGNOSIS — Z7984 Long term (current) use of oral hypoglycemic drugs: Secondary | ICD-10-CM | POA: Insufficient documentation

## 2017-12-19 LAB — URINALYSIS, COMPLETE (UACMP) WITH MICROSCOPIC
Bacteria, UA: NONE SEEN
Bilirubin Urine: NEGATIVE
Glucose, UA: >=500 mg/dL — AB
Hgb urine dipstick: NEGATIVE
Ketones, ur: 5 mg/dL — AB
Leukocytes, UA: NEGATIVE
Nitrite: NEGATIVE
PROTEIN: NEGATIVE mg/dL
SPECIFIC GRAVITY, URINE: 1.039 — AB (ref 1.005–1.030)
pH: 6 (ref 5.0–8.0)

## 2017-12-19 LAB — COMPREHENSIVE METABOLIC PANEL
ALBUMIN: 3 g/dL — AB (ref 3.5–5.0)
ALT: 10 U/L — ABNORMAL LOW (ref 14–54)
AST: 16 U/L (ref 15–41)
Alkaline Phosphatase: 85 U/L (ref 38–126)
Anion gap: 8 (ref 5–15)
BILIRUBIN TOTAL: 0.4 mg/dL (ref 0.3–1.2)
BUN: 12 mg/dL (ref 6–20)
CHLORIDE: 103 mmol/L (ref 101–111)
CO2: 24 mmol/L (ref 22–32)
CREATININE: 0.46 mg/dL (ref 0.44–1.00)
Calcium: 8.6 mg/dL — ABNORMAL LOW (ref 8.9–10.3)
GFR calc Af Amer: >60 mL/min (ref >60)
GLUCOSE: 282 mg/dL — AB (ref 65–99)
POTASSIUM: 3.6 mmol/L (ref 3.5–5.1)
Sodium: 135 mmol/L (ref 135–145)
Total Protein: 6.7 g/dL (ref 6.5–8.1)

## 2017-12-19 LAB — CBC WITH DIFFERENTIAL/PLATELET
BASOS ABS: 0 10*3/uL (ref 0–0.1)
Basophils Relative: 1 %
Eosinophils Absolute: 0.1 10*3/uL (ref 0–0.7)
Eosinophils Relative: 2 %
HEMATOCRIT: 33.7 % — AB (ref 35.0–47.0)
HEMOGLOBIN: 11.1 g/dL — AB (ref 12.0–16.0)
LYMPHS ABS: 2.2 10*3/uL (ref 1.0–3.6)
LYMPHS PCT: 32 %
MCH: 24.7 pg — ABNORMAL LOW (ref 26.0–34.0)
MCHC: 32.8 g/dL (ref 32.0–36.0)
MCV: 75.2 fL — AB (ref 80.0–100.0)
Monocytes Absolute: 0.6 10*3/uL (ref 0.2–0.9)
Monocytes Relative: 9 %
NEUTROS ABS: 4 10*3/uL (ref 1.4–6.5)
NEUTROS PCT: 56 %
PLATELETS: 273 10*3/uL (ref 150–440)
RBC: 4.49 MIL/uL (ref 3.80–5.20)
RDW: 15.1 % — ABNORMAL HIGH (ref 11.5–14.5)
WBC: 6.9 10*3/uL (ref 3.6–11.0)

## 2017-12-19 LAB — PREGNANCY, URINE: Preg Test, Ur: NEGATIVE

## 2017-12-19 LAB — LIPASE, BLOOD: Lipase: 38 U/L (ref 11–51)

## 2017-12-19 MED ORDER — CEPHALEXIN 500 MG PO CAPS: 500.0000 mg | ORAL_CAPSULE | Freq: Two times a day (BID) | ORAL | 0 refills | Status: DC

## 2017-12-19 MED ORDER — ONDANSETRON HCL 4 MG PO TABS: ORAL_TABLET | ORAL | 0 refills | Status: DC

## 2017-12-19 NOTE — Discharge Instructions (Signed)
You have been seen in the Emergency Department (ED) for abdominal pain.  Your evaluation did not identify a clear cause of your symptoms but was generally reassuring.  We are treating you for a possible urinary tract infection based on your urinalysis results.  Please follow up as instructed above regarding today?s emergent visit and the symptoms that are bothering you.  Return to the ED if your abdominal pain worsens or fails to improve, you develop bloody vomiting, bloody diarrhea, you are unable to tolerate fluids due to vomiting, fever greater than 101, or other symptoms that concern you.

## 2017-12-19 NOTE — ED Provider Notes (Signed)
[facility] Emergency Department Provider Note  ____________________________________________   First MD Initiated Contact with Patient 12/19/17 [phone]     (approximate)  I have reviewed the triage vital signs and the nursing notes.   HISTORY  Chief Complaint Abdominal Pain    HPI Mary Velasquez is a 37 y.o. female with medical history as listed below who presents for evaluation of several days of general malaise and body aches, some intermittent tingling pain in her legs, and about 1 day of lower abdominal discomfort that she describes as aching.  The symptoms are moderate.  She is also had some nausea.  She denies vomiting and diarrhea.  She denies fever but she has had some suggested chills.  She has had increased fatigue and wanting to sleep more than usual today.  She denies sore throat, neck pain, neck stiffness, chest pain, shortness of breath, and upper abdominal pain.  No similar symptoms in the past.  No dysuria or hematuria.  She is not currently sexually active and says it is been years since she had unprotected sexual intercourse.  No increased vaginal discharge recently.  Past Medical History:  Diagnosis Date  . Diabetes mellitus without complication (HCC)     There are no active problems to display for this patient.   Past Surgical History:  Procedure Laterality Date  . ABDOMINAL SURGERY    . BREAST SURGERY    . CESAREAN SECTION    . TUBAL LIGATION      Prior to Admission medications   Medication Sig Start Date End Date Taking? Authorizing Provider  cephALEXin (KEFLEX) 500 MG capsule Take 1 capsule (500 mg total) by mouth 2 (two) times daily. 12/19/17   Loleta Rose, MD  furosemide (LASIX) 20 MG tablet Take 1 tablet (20 mg total) by mouth daily. 02/15/17 02/15/18  Phineas Semen, MD  metFORMIN (GLUCOPHAGE) 500 MG tablet Take 1 tablet (500 mg total) by mouth 2 (two) times daily with a meal. 08/10/15   Emily Filbert, MD  metFORMIN  (GLUCOPHAGE) 500 MG tablet Take 1 tablet (500 mg total) by mouth 2 (two) times daily with a meal. 02/15/17 02/15/18  Phineas Semen, MD  methocarbamol (ROBAXIN-750) 750 MG tablet Take 2 tablets (1,500 mg total) by mouth 4 (four) times daily. 06/22/15   Joni Reining, PA-C  methylPREDNISolone (MEDROL DOSEPAK) 4 MG TBPK tablet Take Tapered dose as directed 06/22/15   Joni Reining, PA-C  ondansetron [facility]) 4 MG tablet Take 1-2 tabs by mouth every 8 hours as needed for nausea/vomiting 12/19/17   Loleta Rose, MD  traMADol (ULTRAM) 50 MG tablet Take 1 tablet (50 mg total) by mouth every 6 (six) hours as needed for moderate pain. 06/22/15   Joni Reining, PA-C    Allergies Oxycodone and Oxycontin [oxycodone hcl]  No family history on file.  Social History Social History   Tobacco Use  . Smoking status: Never Smoker  . Smokeless tobacco: Never Used  Substance Use Topics  . Alcohol use: No  . Drug use: Not on file    Review of Systems Constitutional: No fever, some chills.  Malaise and fatigue. Eyes: No visual changes. ENT: No sore throat. Cardiovascular: Denies chest pain. Respiratory: Denies shortness of breath. Gastrointestinal: Lower abdominal pain for about a day with nausea, no vomiting or diarrhea, as described in HPI Genitourinary: Negative for dysuria.  No hematuria. Musculoskeletal: Negative for neck pain.  Negative for back pain.  Intermittent tingling pain in legs for  several days. Integumentary: Negative for rash. Neurological: Negative for headaches, focal weakness or numbness.   ____________________________________________   PHYSICAL EXAM:  VITAL SIGNS: ED Triage Vitals  Enc Vitals Group     BP 12/19/17 0156 126/84     Pulse Rate 12/19/17 0156 88     Resp 12/19/17 0156 16     Temp 12/19/17 0156 98.1 F (36.7 C)     Temp Source 12/19/17 0156 Oral     SpO2 12/19/17 0156 98 %     Weight 12/19/17 0154 86.2 kg (190 lb)     Height 12/19/17 0154 1.524 m  (5')     Head Circumference --      Peak Flow --      Pain Score 12/19/17 0154 8     Pain Loc --      Pain Edu? --      Excl. in GC? --     Constitutional: Alert and oriented. Well appearing and in no acute distress. Eyes: Conjunctivae are normal.  Head: Atraumatic. Nose: No congestion/rhinnorhea. Mouth/Throat: Mucous membranes are moist. Neck: No stridor.  No meningeal signs.   Cardiovascular: Normal rate, regular rhythm. Good peripheral circulation. Grossly normal heart sounds. Respiratory: Normal respiratory effort.  No retractions. Lungs CTAB. Gastrointestinal: Soft and nondistended.  Mild diffuse tenderness throughout the abdomen with no rebound and no guarding Genitourinary: Deferred at patient preference Musculoskeletal: No lower extremity tenderness nor edema. No gross deformities of extremities. Neurologic:  Normal speech and language. No gross focal neurologic deficits are appreciated.  Skin:  Skin is warm, dry and intact. No rash noted. Psychiatric: Mood and affect are normal. Speech and behavior are normal.  ____________________________________________   LABS (all labs ordered are listed, but only abnormal results are displayed)  Labs Reviewed  COMPREHENSIVE METABOLIC PANEL - Abnormal; Notable for the following components:      Result Value   Glucose, Bld 282 (*)    Calcium 8.6 (*)    Albumin 3.0 (*)    ALT 10 (*)    All other components within normal limits  URINALYSIS, COMPLETE (UACMP) WITH MICROSCOPIC - Abnormal; Notable for the following components:   Color, Urine YELLOW (*)    APPearance CLOUDY (*)    Specific Gravity, Urine 1.039 (*)    Glucose, UA >=500 (*)    Ketones, ur 5 (*)    All other components within normal limits  CBC WITH DIFFERENTIAL/PLATELET - Abnormal; Notable for the following components:   Hemoglobin 11.1 (*)    HCT 33.7 (*)    MCV 75.2 (*)    MCH 24.7 (*)    RDW 15.1 (*)    All other components within normal limits  URINE CULTURE    LIPASE, BLOOD  PREGNANCY, URINE   ____________________________________________  EKG  None - EKG not ordered by ED physician ____________________________________________  RADIOLOGY   ED MD interpretation: No indication for imaging  Official radiology report(s): No results found.  ____________________________________________   PROCEDURES  Critical Care performed: No   Procedure(s) performed:   Procedures   ____________________________________________   INITIAL IMPRESSION / ASSESSMENT AND PLAN / ED COURSE  As part of my medical decision making, I reviewed the following data within the electronic MEDICAL RECORD NUMBER Nursing notes reviewed and incorporated, Labs reviewed  and Notes from prior ED visits    Differential diagnosis includes, but is not limited to, UTI, diverticulitis, obstruction/ileus, PID/STD, BV, viral syndrome.  The patient is very well-appearing in no acute distress with only mild  diffuse tenderness throughout her abdomen.  She had to tell me it was tender, though, because she did not actually react to my palpation.  Vital signs are stable and normal and afebrile.  All of her lab work is reassuring and within normal limits including no leukocytosis.  Her urinalysis suggest the possibility of an infection although it is not a clear-cut UTI.  However it is my best explanation at this time.  I explained why we would do a pelvic exam but she does not want to do it unless is absolutely necessary and I was honest that I do not think it is absolutely necessary given her sexual history.  I offered a CT scan but explained I did not recommend it because I think that there is an extremely low chance that she has an acute intra-abdominal infection or a cause of her pain that will be identified on the CT scan, and she is a young woman who does not need excessive radiation.  She states that she trust my judgment at this time.  She agrees to empiric antibiotics for urinary  tract infection, over-the-counter ibuprofen and Tylenol as needed, and she will return to the emergency department if she develops new or worsening symptoms.   Clinical Course as of Dec 20 438  Fri Dec 19, 2017  0425 Provided GoodRx card.   [CF]    Clinical Course User Index [CF] Loleta RoseForbach, Stachia Slutsky, MD    ____________________________________________  FINAL CLINICAL IMPRESSION(S) / ED DIAGNOSES  Final diagnoses:  Malaise  Lower abdominal pain  Nausea  Urinary tract infection without hematuria, site unspecified     MEDICATIONS GIVEN DURING THIS VISIT:  Medications - No data to display   ED Discharge Orders        Ordered    cephALEXin (KEFLEX) 500 MG capsule  2 times daily,   Status:  Discontinued     12/19/17 0440    cephALEXin (KEFLEX) 500 MG capsule  2 times daily     12/19/17 0440    ondansetron (ZOFRAN) 4 MG tablet     12/19/17 0440       Note:  This document was prepared using Dragon voice recognition software and may include unintentional dictation errors.    Loleta RoseForbach, Taurus Alamo, MD 12/19/17 [phone]

## 2017-12-19 NOTE — ED Triage Notes (Signed)
Patient ambulatory to triage with steady gait, without difficulty or distress noted; pt reports lower abd pain radiating into back accomp by nausea x 2 days; denies hx of same

## 2017-12-19 NOTE — ED Notes (Signed)
Patient discharged to home per MD order. Patient in stable condition, and deemed medically cleared by ED provider for discharge. Discharge instructions reviewed with patient/family using "Teach Back"; verbalized understanding of medication education and administration, and information about follow-up care. Denies further concerns. ° °

## 2017-12-19 NOTE — ED Notes (Signed)
Pt in with co lower abd pain that started tonight at 0100 tonight. Also co body aches and nausea. No diarrhea, dysuria, or fever.

## 2017-12-20 LAB — URINE CULTURE: SPECIAL REQUESTS: NORMAL

## 2018-07-01 ENCOUNTER — Emergency Department: Payer: Self-pay

## 2018-07-01 ENCOUNTER — Other Ambulatory Visit: Payer: Self-pay

## 2018-07-01 ENCOUNTER — Emergency Department
Admission: EM | Admit: 2018-07-01 | Discharge: 2018-07-01 | Disposition: A | Discharge status: Home / Self Care | Payer: Self-pay | Attending: Emergency Medicine | Admitting: Emergency Medicine

## 2018-07-01 ENCOUNTER — Encounter: Payer: Self-pay | Admitting: Emergency Medicine

## 2018-07-01 DIAGNOSIS — R1013 Epigastric pain: Secondary | ICD-10-CM | POA: Insufficient documentation

## 2018-07-01 DIAGNOSIS — R1010 Upper abdominal pain, unspecified: Secondary | ICD-10-CM

## 2018-07-01 DIAGNOSIS — E119 Type 2 diabetes mellitus without complications: Secondary | ICD-10-CM | POA: Insufficient documentation

## 2018-07-01 DIAGNOSIS — Z7984 Long term (current) use of oral hypoglycemic drugs: Secondary | ICD-10-CM | POA: Insufficient documentation

## 2018-07-01 LAB — CBC
HEMATOCRIT: 39.6 % (ref 36.0–46.0)
HEMOGLOBIN: 12.3 g/dL (ref 12.0–15.0)
MCH: 24.9 pg — ABNORMAL LOW (ref 26.0–34.0)
MCHC: 31.1 g/dL (ref 30.0–36.0)
MCV: 80.2 fL (ref 80.0–100.0)
NRBC: 0 % (ref 0.0–0.2)
Platelets: 262 10*3/uL (ref 150–400)
RBC: 4.94 MIL/uL (ref 3.87–5.11)
RDW: 14.1 % (ref 11.5–15.5)
WBC: 7.4 10*3/uL (ref 4.0–10.5)

## 2018-07-01 LAB — COMPREHENSIVE METABOLIC PANEL
ALT: 13 U/L (ref 0–44)
AST: 16 U/L (ref 15–41)
Albumin: 3.6 g/dL (ref 3.5–5.0)
Alkaline Phosphatase: 99 U/L (ref 38–126)
Anion gap: 9 (ref 5–15)
BILIRUBIN TOTAL: 0.4 mg/dL (ref 0.3–1.2)
BUN: 16 mg/dL (ref 6–20)
CHLORIDE: 105 mmol/L (ref 98–111)
CO2: 21 mmol/L — ABNORMAL LOW (ref 22–32)
Calcium: 8.8 mg/dL — ABNORMAL LOW (ref 8.9–10.3)
Creatinine, Ser: 0.52 mg/dL (ref 0.44–1.00)
Glucose, Bld: 315 mg/dL — ABNORMAL HIGH (ref 70–99)
POTASSIUM: 4.1 mmol/L (ref 3.5–5.1)
Sodium: 135 mmol/L (ref 135–145)
TOTAL PROTEIN: 7.4 g/dL (ref 6.5–8.1)

## 2018-07-01 LAB — LIPASE, BLOOD: LIPASE: 39 U/L (ref 11–51)

## 2018-07-01 MED ORDER — IOPAMIDOL (ISOVUE-300) INJECTION 61%
30.0000 mL | Freq: Once | INTRAVENOUS | Status: AC | PRN
  Administered 2018-07-01: 30 mL via ORAL

## 2018-07-01 MED ORDER — MORPHINE SULFATE (PF) 4 MG/ML IV SOLN
4.0000 mg | Freq: Once | INTRAVENOUS | Status: AC
  Administered 2018-07-01: 4 mg via INTRAVENOUS
  Filled 2018-07-01: qty 1

## 2018-07-01 MED ORDER — ONDANSETRON HCL 4 MG/2ML IJ SOLN
4.0000 mg | Freq: Once | INTRAMUSCULAR | Status: AC
  Administered 2018-07-01: 4 mg via INTRAVENOUS
  Filled 2018-07-01: qty 2

## 2018-07-01 MED ORDER — SUCRALFATE 1 G PO TABS: 1.0000 g | ORAL_TABLET | Freq: Four times a day (QID) | ORAL | 1 refills | Status: DC

## 2018-07-01 MED ORDER — IOPAMIDOL (ISOVUE-300) INJECTION 61%
100.0000 mL | Freq: Once | INTRAVENOUS | Status: AC | PRN
  Administered 2018-07-01: 100 mL via INTRAVENOUS

## 2018-07-01 MED ORDER — IOHEXOL 300 MG/ML  SOLN: 100.0000 mL | Freq: Once | INTRAMUSCULAR | Status: DC | PRN

## 2018-07-01 MED ORDER — PANTOPRAZOLE SODIUM 20 MG PO TBEC: 20.0000 mg | DELAYED_RELEASE_TABLET | Freq: Every day | ORAL | 1 refills | Status: DC

## 2018-07-01 NOTE — ED Provider Notes (Signed)
[facility] Emergency Department Provider Note   ____________________________________________    I have reviewed the triage vital signs and the nursing notes.   HISTORY  Chief Complaint Abdominal Pain     HPI Mary Velasquez is a 38 y.o. female who presents with complaints of right upper quadrant abdominal pain.  Patient has a history of diabetes.  Patient reports over the last month she has had intermittent episodes of right upper quadrant pain which occasionally radiates to her shoulder blade.  Over the last 2 days pain is been constant and moderate to severe.  She denies fevers or chills.  Has taken ibuprofen with little improvement.  No history of abdominal surgery besides C-section  Past Medical History:  Diagnosis Date  . Diabetes mellitus without complication (HCC)     There are no active problems to display for this patient.   Past Surgical History:  Procedure Laterality Date  . ABDOMINAL SURGERY    . BREAST SURGERY    . CESAREAN SECTION    . TUBAL LIGATION      Prior to Admission medications   Medication Sig Start Date End Date Taking? Authorizing Provider  cephALEXin (KEFLEX) 500 MG capsule Take 1 capsule (500 mg total) by mouth 2 (two) times daily. 12/19/17   Loleta RoseForbach, Cory, MD  furosemide (LASIX) 20 MG tablet Take 1 tablet (20 mg total) by mouth daily. 02/15/17 02/15/18  Phineas SemenGoodman, Graydon, MD  metFORMIN (GLUCOPHAGE) 500 MG tablet Take 1 tablet (500 mg total) by mouth 2 (two) times daily with a meal. 08/10/15   Emily FilbertWilliams, Jonathan E, MD  metFORMIN (GLUCOPHAGE) 500 MG tablet Take 1 tablet (500 mg total) by mouth 2 (two) times daily with a meal. 02/15/17 02/15/18  Phineas SemenGoodman, Graydon, MD  methocarbamol (ROBAXIN-750) 750 MG tablet Take 2 tablets (1,500 mg total) by mouth 4 (four) times daily. 06/22/15   Joni ReiningSmith, Ronald K, PA-C  methylPREDNISolone (MEDROL DOSEPAK) 4 MG TBPK tablet Take Tapered dose as directed 06/22/15   Joni ReiningSmith, Ronald K, PA-C    ondansetron [facility](ZOFRAN) 4 MG tablet Take 1-2 tabs by mouth every 8 hours as needed for nausea/vomiting 12/19/17   Loleta RoseForbach, Cory, MD  traMADol (ULTRAM) 50 MG tablet Take 1 tablet (50 mg total) by mouth every 6 (six) hours as needed for moderate pain. 06/22/15   Joni ReiningSmith, Ronald K, PA-C     Allergies Oxycodone and Oxycontin [oxycodone hcl]  History reviewed. No pertinent family history.  Social History Social History   Tobacco Use  . Smoking status: Never Smoker  . Smokeless tobacco: Never Used  Substance Use Topics  . Alcohol use: No  . Drug use: Not on file    Review of Systems  Constitutional: No fever/chills Eyes: No visual changes.  ENT: No sore throat. Cardiovascular: Denies chest pain. Respiratory: Denies shortness of breath. Gastrointestinal: As above Genitourinary: Negative for dysuria. Musculoskeletal: Negative for back pain. Skin: Negative for rash. Neurological: Negative for headaches    ____________________________________________   PHYSICAL EXAM:  VITAL SIGNS: ED Triage Vitals  Enc Vitals Group     BP 07/01/18 0928 116/89     Pulse Rate 07/01/18 0928 (!) 105     Resp 07/01/18 0928 16     Temp 07/01/18 0928 98.5 F (36.9 C)     Temp Source 07/01/18 0928 Oral     SpO2 07/01/18 0928 97 %     Weight 07/01/18 0928 86.2 kg (190 lb)     Height 07/01/18 0928 1.524 m (5')  Head Circumference --      Peak Flow --      Pain Score 07/01/18 0927 10     Pain Loc --      Pain Edu? --      Excl. in GC? --     Constitutional: Alert and oriented.  Eyes: Conjunctivae are normal.   Nose: No congestion/rhinnorhea. Mouth/Throat: Mucous membranes are moist.    Cardiovascular: Normal rate, regular rhythm. Grossly normal heart sounds.  Good peripheral circulation. Respiratory: Normal respiratory effort.  No retractions. Lungs CTAB. Gastrointestinal: Tenderness palpation right upper quadrant positive Murphy's. No distention.  No CVA tenderness.  Musculoskeletal:    Warm and well perfused Neurologic:  Normal speech and language. No gross focal neurologic deficits are appreciated.  Skin:  Skin is warm, dry and intact. No rash noted. Psychiatric: Mood and affect are normal. Speech and behavior are normal.  ____________________________________________   LABS (all labs ordered are listed, but only abnormal results are displayed)  Labs Reviewed  LIPASE, BLOOD  COMPREHENSIVE METABOLIC PANEL  CBC  URINALYSIS, COMPLETE (UACMP) WITH MICROSCOPIC  POC URINE PREG, ED   ____________________________________________  EKG  ED ECG REPORT I, Jene Everyobert Henri Guedes, the attending physician, personally viewed and interpreted this ECG.  Date: 07/01/2018  Rhythm: normal sinus rhythm QRS Axis: normal Intervals: normal ST/T Wave abnormalities: normal Narrative Interpretation: no evidence of acute ischemia  ____________________________________________  RADIOLOGY  Ultrasound right upper quadrant pending ____________________________________________   PROCEDURES  Procedure(s) performed: No  Procedures   Critical Care performed: No ____________________________________________   INITIAL IMPRESSION / ASSESSMENT AND PLAN / ED COURSE  Pertinent labs & imaging results that were available during my care of the patient were reviewed by me and considered in my medical decision making (see chart for details).  Patient presents with right upper quadrant pain radiating to her shoulder blade, strongly suspicious for cholelithiasis, cholecystitis.  Differential also includes pancreatitis, gastritis.  We will treat with IV morphine and IV Zofran and sent for ultrasound  Ultrasound negative for cholelithiasis, patient continues to have significant pain, will send for CT  CT scan unremarkable, patient feeling well.  Recommend follow-up with GI, will start on PPI, Protonix    ____________________________________________   FINAL CLINICAL IMPRESSION(S) / ED  DIAGNOSES  Final diagnoses:  Upper abdominal pain        Note:  This document was prepared using Dragon voice recognition software and may include unintentional dictation errors.    Jene EveryKinner, Cambreigh Dearing, MD 07/01/18 [phone]

## 2018-07-01 NOTE — ED Notes (Signed)
Lorrie,RN made aware pt is in rm and that EKG is preformed.

## 2018-07-01 NOTE — ED Triage Notes (Addendum)
Pt c/o initially intermittent RUQ pain that is now constant.  Pain is worse after eating. Still has gallbladder.  Fever 101 last night but gone this morning. Has had vomiting. No drinking last night per pt. No hx pancreatitis.

## 2018-07-08 ENCOUNTER — Encounter: Payer: Self-pay | Admitting: Emergency Medicine

## 2018-07-08 ENCOUNTER — Observation Stay
Admission: EM | Admit: 2018-07-08 | Discharge: 2018-07-10 | Disposition: A | Discharge status: Home / Self Care | Payer: Self-pay | Attending: Surgery | Admitting: Surgery

## 2018-07-08 ENCOUNTER — Other Ambulatory Visit: Payer: Self-pay

## 2018-07-08 DIAGNOSIS — Z79899 Other long term (current) drug therapy: Secondary | ICD-10-CM | POA: Insufficient documentation

## 2018-07-08 DIAGNOSIS — K529 Noninfective gastroenteritis and colitis, unspecified: Principal | ICD-10-CM | POA: Insufficient documentation

## 2018-07-08 DIAGNOSIS — R1011 Right upper quadrant pain: Secondary | ICD-10-CM

## 2018-07-08 DIAGNOSIS — Z794 Long term (current) use of insulin: Secondary | ICD-10-CM | POA: Insufficient documentation

## 2018-07-08 DIAGNOSIS — E1165 Type 2 diabetes mellitus with hyperglycemia: Secondary | ICD-10-CM | POA: Insufficient documentation

## 2018-07-08 DIAGNOSIS — D509 Iron deficiency anemia, unspecified: Secondary | ICD-10-CM | POA: Insufficient documentation

## 2018-07-08 DIAGNOSIS — Z9119 Patient's noncompliance with other medical treatment and regimen: Secondary | ICD-10-CM | POA: Insufficient documentation

## 2018-07-08 DIAGNOSIS — Z885 Allergy status to narcotic agent status: Secondary | ICD-10-CM | POA: Insufficient documentation

## 2018-07-08 DIAGNOSIS — K2950 Unspecified chronic gastritis without bleeding: Secondary | ICD-10-CM | POA: Insufficient documentation

## 2018-07-08 LAB — COMPREHENSIVE METABOLIC PANEL
ALBUMIN: 3.4 g/dL — AB (ref 3.5–5.0)
ALK PHOS: 93 U/L (ref 38–126)
ALT: 12 U/L (ref 0–44)
ANION GAP: 10 (ref 5–15)
AST: 19 U/L (ref 15–41)
BUN: 8 mg/dL (ref 6–20)
CALCIUM: 8.9 mg/dL (ref 8.9–10.3)
CO2: 23 mmol/L (ref 22–32)
Chloride: 102 mmol/L (ref 98–111)
Creatinine, Ser: 0.8 mg/dL (ref 0.44–1.00)
GFR calc Af Amer: >60 mL/min (ref >60)
GFR calc non Af Amer: >60 mL/min (ref >60)
GLUCOSE: 419 mg/dL — AB (ref 70–99)
Potassium: 3.8 mmol/L (ref 3.5–5.1)
SODIUM: 135 mmol/L (ref 135–145)
Total Bilirubin: 0.4 mg/dL (ref 0.3–1.2)
Total Protein: 7.2 g/dL (ref 6.5–8.1)

## 2018-07-08 LAB — CBC
HCT: 37.9 % (ref 36.0–46.0)
HEMOGLOBIN: 12.1 g/dL (ref 12.0–15.0)
MCH: 25.2 pg — ABNORMAL LOW (ref 26.0–34.0)
MCHC: 31.9 g/dL (ref 30.0–36.0)
MCV: 79 fL — ABNORMAL LOW (ref 80.0–100.0)
PLATELETS: 293 10*3/uL (ref 150–400)
RBC: 4.8 MIL/uL (ref 3.87–5.11)
RDW: 14.3 % (ref 11.5–15.5)
WBC: 7.8 10*3/uL (ref 4.0–10.5)
nRBC: 0 % (ref 0.0–0.2)

## 2018-07-08 LAB — URINALYSIS, COMPLETE (UACMP) WITH MICROSCOPIC
Bacteria, UA: NONE SEEN
Bilirubin Urine: NEGATIVE
Glucose, UA: >=500 mg/dL — AB
Ketones, ur: 5 mg/dL — AB
Leukocytes, UA: NEGATIVE
Nitrite: NEGATIVE
PH: 5 (ref 5.0–8.0)
Protein, ur: NEGATIVE mg/dL
Specific Gravity, Urine: 1.036 — ABNORMAL HIGH (ref 1.005–1.030)
WBC, UA: NONE SEEN WBC/hpf (ref 0–5)

## 2018-07-08 LAB — GLUCOSE, CAPILLARY
Glucose-Capillary: 276 mg/dL — ABNORMAL HIGH (ref 70–99)
Glucose-Capillary: 210 mg/dL — ABNORMAL HIGH (ref 70–99)

## 2018-07-08 LAB — POCT PREGNANCY, URINE: Preg Test, Ur: NEGATIVE

## 2018-07-08 LAB — LIPASE, BLOOD: Lipase: 45 U/L (ref 11–51)

## 2018-07-08 MED ORDER — PANTOPRAZOLE SODIUM 40 MG IV SOLR
40.0000 mg | Freq: Every day | INTRAVENOUS | Status: DC
  Administered 2018-07-09: 40 mg via INTRAVENOUS
  Filled 2018-07-08: qty 40

## 2018-07-08 MED ORDER — CELECOXIB 200 MG PO CAPS
200.0000 mg | ORAL_CAPSULE | Freq: Every day | ORAL | Status: DC
  Administered 2018-07-09 – 2018-07-10 (×2): 200 mg via ORAL
  Filled 2018-07-08 (×3): qty 1

## 2018-07-08 MED ORDER — GABAPENTIN 100 MG PO CAPS: 200.0000 mg | ORAL_CAPSULE | Freq: Two times a day (BID) | ORAL | Status: DC | PRN

## 2018-07-08 MED ORDER — MUPIROCIN 2 % EX OINT
1.0000 "application " | TOPICAL_OINTMENT | Freq: Two times a day (BID) | CUTANEOUS | Status: DC
  Administered 2018-07-09 – 2018-07-10 (×2): 1 via NASAL
  Filled 2018-07-08 (×2): qty 22

## 2018-07-08 MED ORDER — INSULIN ASPART 100 UNIT/ML ~~LOC~~ SOLN
0.0000 [IU] | Freq: Every day | SUBCUTANEOUS | Status: DC
  Administered 2018-07-09: 2 [IU] via SUBCUTANEOUS
  Filled 2018-07-08: qty 1

## 2018-07-08 MED ORDER — ONDANSETRON 4 MG PO TBDP: 4.0000 mg | ORAL_TABLET | Freq: Four times a day (QID) | ORAL | Status: DC | PRN

## 2018-07-08 MED ORDER — TRAMADOL HCL 50 MG PO TABS
50.0000 mg | ORAL_TABLET | Freq: Four times a day (QID) | ORAL | Status: DC | PRN
  Administered 2018-07-09 (×2): 50 mg via ORAL
  Filled 2018-07-08 (×2): qty 1

## 2018-07-08 MED ORDER — SODIUM CHLORIDE 0.9 % IV SOLN: INTRAVENOUS | Status: DC

## 2018-07-08 MED ORDER — ONDANSETRON HCL 4 MG/2ML IJ SOLN: 4.0000 mg | Freq: Four times a day (QID) | INTRAMUSCULAR | Status: DC | PRN

## 2018-07-08 MED ORDER — METFORMIN HCL 500 MG PO TABS: 500.0000 mg | ORAL_TABLET | Freq: Two times a day (BID) | ORAL | Status: DC

## 2018-07-08 MED ORDER — LIDOCAINE VISCOUS HCL 2 % MT SOLN
15.0000 mL | Freq: Once | OROMUCOSAL | Status: AC
  Administered 2018-07-08: 15 mL via OROMUCOSAL
  Filled 2018-07-08: qty 15

## 2018-07-08 MED ORDER — SIMETHICONE 80 MG PO CHEW
40.0000 mg | CHEWABLE_TABLET | Freq: Four times a day (QID) | ORAL | Status: DC | PRN
  Filled 2018-07-08: qty 1

## 2018-07-08 MED ORDER — INSULIN ASPART 100 UNIT/ML ~~LOC~~ SOLN
0.0000 [IU] | Freq: Three times a day (TID) | SUBCUTANEOUS | Status: DC
  Administered 2018-07-09 – 2018-07-10 (×4): 5 [IU] via SUBCUTANEOUS
  Filled 2018-07-08 (×4): qty 1

## 2018-07-08 MED ORDER — METFORMIN HCL 500 MG PO TABS
500.0000 mg | ORAL_TABLET | Freq: Two times a day (BID) | ORAL | Status: DC
  Administered 2018-07-09 – 2018-07-10 (×3): 500 mg via ORAL
  Filled 2018-07-08 (×3): qty 1

## 2018-07-08 MED ORDER — SODIUM CHLORIDE 0.9 % IV SOLN
INTRAVENOUS | Status: DC
  Administered 2018-07-09 – 2018-07-10 (×3): via INTRAVENOUS

## 2018-07-08 MED ORDER — INSULIN ASPART 100 UNIT/ML ~~LOC~~ SOLN
5.0000 [IU] | Freq: Once | SUBCUTANEOUS | Status: AC
  Administered 2018-07-08: 5 [IU] via SUBCUTANEOUS
  Filled 2018-07-08: qty 0.05
  Filled 2018-07-08: qty 1

## 2018-07-08 MED ORDER — ENOXAPARIN SODIUM 40 MG/0.4ML ~~LOC~~ SOLN: 40.0000 mg | SUBCUTANEOUS | Status: DC

## 2018-07-08 MED ORDER — MORPHINE SULFATE (PF) 2 MG/ML IV SOLN
2.0000 mg | INTRAVENOUS | Status: DC | PRN
  Administered 2018-07-09 (×2): 2 mg via INTRAVENOUS
  Filled 2018-07-08 (×2): qty 1

## 2018-07-08 MED ORDER — ALUM & MAG HYDROXIDE-SIMETH 200-200-20 MG/5ML PO SUSP
30.0000 mL | Freq: Once | ORAL | Status: AC
  Administered 2018-07-08: 30 mL via ORAL
  Filled 2018-07-08: qty 30

## 2018-07-08 MED ORDER — POLYETHYLENE GLYCOL 3350 17 G PO PACK: 17.0000 g | PACK | Freq: Every day | ORAL | Status: DC | PRN

## 2018-07-08 NOTE — ED Triage Notes (Signed)
Pt was seen 1/1 for pain in her RUQ. Pt states she still has the same pain but also reports left sided abdominal pain that started last night. Pt states the pain feels "like inflammation and burning." Pt reports nausea but denies vomiting or diarrhea.

## 2018-07-08 NOTE — ED Provider Notes (Signed)
[facility] Emergency Department Provider Note  ____________________________________________   First MD Initiated Contact with Patient 07/08/18 1553     (approximate)  I have reviewed the triage vital signs and the nursing notes.   HISTORY  Chief Complaint Abdominal Pain   HPI CHARNETTE YOUNKIN is a 38 y.o. female with a history of diabetes not on any medication at this time was brought in the emergency department with right upper quadrant domino pain.  Says she was in the emergency department 1 week ago with similar symptoms and had a CAT scan as well as an ultrasound which did not reveal any acute pathology.  She is returning today because of the symptoms returning about midday today.  She says that she is having a 10 out of 10 pain which is a cramping pain to the right upper quadrant radiating to through to her back and right shoulder.  She states that it worsens with eating.  Also worsens with deep breathing.  Not on any hormone supplementation.  Says that after the last discharge 1 week ago the pain resolved but is now returned.  Taking her PPI last dose was yesterday.   Past Medical History:  Diagnosis Date  . Diabetes mellitus without complication [facility])     Patient Active Problem List   Diagnosis Date Noted  . Abdominal pain 07/08/2018    Past Surgical History:  Procedure Laterality Date  . ABDOMINAL SURGERY    . BREAST SURGERY    . CESAREAN SECTION    . TUBAL LIGATION      Prior to Admission medications   Medication Sig Start Date End Date Taking? Authorizing Provider  cephALEXin (KEFLEX) 500 MG capsule Take 1 capsule (500 mg total) by mouth 2 (two) times daily. 12/19/17   Loleta Rose, MD  furosemide (LASIX) 20 MG tablet Take 1 tablet (20 mg total) by mouth daily. 02/15/17 02/15/18  Phineas Semen, MD  metFORMIN (GLUCOPHAGE) 500 MG tablet Take 1 tablet (500 mg total) by mouth 2 (two) times daily with a meal. 08/10/15   Emily Filbert, MD    metFORMIN (GLUCOPHAGE) 500 MG tablet Take 1 tablet (500 mg total) by mouth 2 (two) times daily with a meal. 02/15/17 02/15/18  Phineas Semen, MD  methocarbamol (ROBAXIN-750) 750 MG tablet Take 2 tablets (1,500 mg total) by mouth 4 (four) times daily. 06/22/15   Joni Reining, PA-C  methylPREDNISolone (MEDROL DOSEPAK) 4 MG TBPK tablet Take Tapered dose as directed 06/22/15   Joni Reining, PA-C  ondansetron [facility]) 4 MG tablet Take 1-2 tabs by mouth every 8 hours as needed for nausea/vomiting 12/19/17   Loleta Rose, MD  pantoprazole (PROTONIX) 20 MG tablet Take 1 tablet (20 mg total) by mouth daily. 07/01/18 07/01/19  Jene Every, MD  sucralfate (CARAFATE) 1 g tablet Take 1 tablet (1 g total) by mouth 4 (four) times daily for 15 days. 07/01/18 07/16/18  Jene Every, MD  traMADol (ULTRAM) 50 MG tablet Take 1 tablet (50 mg total) by mouth every 6 (six) hours as needed for moderate pain. 06/22/15   Joni Reining, PA-C    Allergies Oxycodone and Oxycontin [oxycodone hcl]  No family history on file.  Social History Social History   Tobacco Use  . Smoking status: Never Smoker  . Smokeless tobacco: Never Used  Substance Use Topics  . Alcohol use: No  . Drug use: Not on file    Review of Systems  Constitutional: No fever/chills Eyes: No  visual changes. ENT: No sore throat. Cardiovascular: Denies chest pain. Respiratory: Denies shortness of breath. Gastrointestinal: No constipation. Genitourinary: Negative for dysuria. Musculoskeletal:  as above Skin: Negative for rash. Neurological: Negative for headaches, focal weakness or numbness.   ____________________________________________   PHYSICAL EXAM:  VITAL SIGNS: ED Triage Vitals  Enc Vitals Group     BP 07/08/18 1458 136/78     Pulse Rate 07/08/18 1458 83     Resp 07/08/18 1458 18     Temp 07/08/18 1458 98.3 F (36.8 C)     Temp Source 07/08/18 1458 Oral     SpO2 07/08/18 1458 99 %     Weight 07/08/18 1459 190  lb (86.2 kg)     Height 07/08/18 1459 5' (1.524 m)     Head Circumference --      Peak Flow --      Pain Score 07/08/18 1459 10     Pain Loc --      Pain Edu? --      Excl. in GC? --     Constitutional: Alert and oriented. Well appearing and in no acute distress. Eyes: Conjunctivae are normal.  Head: Atraumatic. Nose: No congestion/rhinnorhea. Mouth/Throat: Mucous membranes are moist.  Neck: No stridor.   Cardiovascular: Normal rate, regular rhythm. Grossly normal heart sounds.   Respiratory: Normal respiratory effort.  No retractions. Lungs CTAB. Gastrointestinal: Soft with moderate severe right upper quadrant tenderness to palpation with a positive Murphy sign.  No distention. No CVA tenderness. Musculoskeletal: No lower extremity tenderness nor edema.  No joint effusions. Neurologic:  Normal speech and language. No gross focal neurologic deficits are appreciated. Skin:  Skin is warm, dry and intact. No rash noted. Psychiatric: Mood and affect are normal. Speech and behavior are normal.  ____________________________________________   LABS (all labs ordered are listed, but only abnormal results are displayed)  Labs Reviewed  COMPREHENSIVE METABOLIC PANEL - Abnormal; Notable for the following components:      Result Value   Glucose, Bld 419 (*)    Albumin 3.4 (*)    All other components within normal limits  CBC - Abnormal; Notable for the following components:   MCV 79.0 (*)    MCH 25.2 (*)    All other components within normal limits  URINALYSIS, COMPLETE (UACMP) WITH MICROSCOPIC - Abnormal; Notable for the following components:   Color, Urine STRAW (*)    APPearance CLEAR (*)    Specific Gravity, Urine 1.036 (*)    Glucose, UA >=500 (*)    Hgb urine dipstick MODERATE (*)    Ketones, ur 5 (*)    All other components within normal limits  GLUCOSE, CAPILLARY - Abnormal; Notable for the following components:   Glucose-Capillary 276 (*)    All other components within  normal limits  LIPASE, BLOOD  HIV ANTIBODY (ROUTINE TESTING W REFLEX)  BASIC METABOLIC PANEL  MAGNESIUM  PHOSPHORUS  CBC  HEPATIC FUNCTION PANEL  HEMOGLOBIN A1C  POCT PREGNANCY, URINE  CBG MONITORING, ED  POC URINE PREG, ED   ____________________________________________  EKG   ____________________________________________  RADIOLOGY   ____________________________________________   PROCEDURES  Procedure(s) performed:   Procedures  Critical Care performed:   ____________________________________________   INITIAL IMPRESSION / ASSESSMENT AND PLAN / ED COURSE  Pertinent labs & imaging results that were available during my care of the patient were reviewed by me and considered in my medical decision making (see chart for details).  Differential diagnosis includes, but is not limited to, biliary  disease (biliary colic, acute cholecystitis, cholangitis, choledocholithiasis, etc), intrathoracic causes for epigastric abdominal pain including ACS, gastritis, duodenitis, pancreatitis, small bowel or large bowel obstruction, abdominal aortic aneurysm, hernia, and ulcer(s). As part of my medical decision making, I reviewed the following data within the electronic MEDICAL RECORD NUMBER Notes from prior ED visits  PE RC negative.  ----------------------------------------- 8:36 PM on 07/08/2018 -----------------------------------------  Despite Maalox as well as viscous lidocaine the patient is having persistent pain to the right upper quadrant.  Evaluated by Dr. Tonna BoehringerSakai who recommends admission for HIDA scan and glucose control.  Dr. Tonna BoehringerSakai says he will discuss with the hospitalist to determine the primary admitting service.   ____________________________________________   FINAL CLINICAL IMPRESSION(S) / ED DIAGNOSES  Final diagnoses:  Right upper quadrant abdominal pain      NEW MEDICATIONS STARTED DURING THIS VISIT:  New Prescriptions   No medications on file     Note:   This document was prepared using Dragon voice recognition software and may include unintentional dictation errors.     Myrna BlazerSchaevitz, Glynnis Gavel Matthew, MD 07/08/18 2037

## 2018-07-08 NOTE — Consult Note (Signed)
Subjective:   CC: RUQ pain  HPI:  Mary Velasquez is a 38 y.o. female who is consulted by [facility] for evaluation of above cc.  Symptoms were first noted 1 month ago. Pain is sharp, waxing and waning but overall getting worse.   Associated with nausea, exacerbated by nothing specific.  Complaining of clay colored stools  Past Medical History:  has a past medical history of Diabetes mellitus without complication (HCC).  Past Surgical History:  has a past surgical history that includes Abdominal surgery; Breast surgery; Cesarean section; and Tubal ligation.  Family History: sister with cholecystitis  Social History:  reports that she has never smoked. She has never used smokeless tobacco. She reports that she does not drink alcohol. No history on file for drug.  Current Medications: none  Allergies:  Allergies as of 07/08/2018 - Review Complete 07/08/2018  Allergen Reaction Noted  . Oxycodone  06/22/2015  . Oxycontin [oxycodone hcl]  12/19/2017    ROS:  General: Denies weight loss, weight gain, fatigue, fevers, chills, and night sweats. Eyes: Denies blurry vision, double vision, eye pain, itchy eyes, and tearing. Ears: Denies hearing loss, earache, and ringing in ears. Nose: Denies sinus pain, congestion, infections, runny nose, and nosebleeds. Mouth/throat: Denies hoarseness, sore throat, bleeding gums, and difficulty swallowing. Heart: Denies chest pain, palpitations, racing heart, irregular heartbeat, leg pain or swelling, and decreased activity tolerance. Respiratory: Denies breathing difficulty, shortness of breath, wheezing, cough, and sputum. GI: Denies change in appetite, heartburn, nausea, vomiting, constipation, diarrhea, and blood in stool. GU: Denies difficulty urinating, pain with urinating, urgency, frequency, blood in urine. Musculoskeletal: Denies joint stiffness, pain, swelling, muscle weakness, and pain. Skin: Denies rash, itching, mass, tumors, sores, and  boils Neurologic: Denies headache, fainting, dizziness, seizures, numbness, and tingling. Psychiatric: Denies depression, anxiety, difficulty sleeping, and memory loss. Endocrine: Denies heat or cold intolerance, and increased thirst or urination. Blood/lymph: Denies easy bruising, easy bruising, and swollen glands     Objective:     BP (!) 144/84 (BP Location: Right Arm)   Pulse 96   Temp 98.3 F (36.8 C) (Oral)   Resp 18   Ht 5' (1.524 m)   Wt 86.2 kg   LMP 06/22/2018   SpO2 100%   BMI 37.11 kg/m    Constitutional :  alert, cooperative, appears stated age and no distress  Lymphatics/Throat:  no asymmetry, masses, or scars  Respiratory:  clear to auscultation bilaterally  Cardiovascular:  regular rate and rhythm  Gastrointestinal: soft, focal tenderness in the RUQ.   Musculoskeletal: Steady gait and movement  Skin: Cool and moist  Psychiatric: Normal affect, non-agitated, not confused       LABS:  CMP Latest Ref Rng & Units 07/08/2018 07/01/2018 12/19/2017  Glucose 70 - 99 mg/dL 498(Y) 641(R) 830(N)  BUN 6 - 20 mg/dL 8 16 12   Creatinine 0.44 - 1.00 mg/dL 4.07 6.80 8.81  Sodium 135 - 145 mmol/L 135 135 135  Potassium 3.5 - 5.1 mmol/L 3.8 4.1 3.6  Chloride 98 - 111 mmol/L 102 105 103  CO2 22 - 32 mmol/L 23 21(L) 24  Calcium 8.9 - 10.3 mg/dL 8.9 1.0(R) 1.5(X)  Total Protein 6.5 - 8.1 g/dL 7.2 7.4 6.7  Total Bilirubin 0.3 - 1.2 mg/dL 0.4 0.4 0.4  Alkaline Phos 38 - 126 U/L 93 99 85  AST 15 - 41 U/L 19 16 16   ALT 0 - 44 U/L 12 13 10(L)   CBC Latest Ref Rng & Units 07/08/2018 07/01/2018  12/19/2017  WBC 4.0 - 10.5 K/uL 7.8 7.4 6.9  Hemoglobin 12.0 - 15.0 g/dL 48.0 16.5 11.1(L)  Hematocrit 36.0 - 46.0 % 37.9 39.6 33.7(L)  Platelets 150 - 400 K/uL 293 262 273     RADS: CLINICAL DATA:  intermittent RUQ pain that is now constant. Pain is worse after eating. Still has gallbladder. Fever 101 last night but gone this morning. Has had vomiting. No drinking last night per pt. No  hx pancreatitis.  EXAM: CT ABDOMEN AND PELVIS WITH CONTRAST  TECHNIQUE: Multidetector CT imaging of the abdomen and pelvis was performed using the standard protocol following bolus administration of intravenous contrast.  CONTRAST:  ISOVUE-300 IOPAMIDOL (ISOVUE-300) INJECTION 61%  COMPARISON:  Ultrasound from earlier the same day  FINDINGS: Lower chest: No acute abnormality.  Hepatobiliary: No focal liver abnormality is seen. No gallstones, gallbladder wall thickening, or biliary dilatation.  Pancreas: Unremarkable. No pancreatic ductal dilatation or surrounding inflammatory changes.  Spleen: Normal in size without focal abnormality.  Adrenals/Urinary Tract: Adrenal glands are unremarkable. Kidneys are normal, without renal calculi, focal lesion, or hydronephrosis. Bladder is unremarkable.  Stomach/Bowel: Stomach is incompletely distended. Small bowel is nondilated. Appendix not discretely identified. Colon is unremarkable.  Vascular/Lymphatic: No significant vascular findings are present. No enlarged abdominal or pelvic lymph nodes.  Reproductive: Uterus unremarkable. Bilateral tubal ligation clips. Probable right ovarian corpus luteum cyst.  Other: No ascites. No free air.  Musculoskeletal: No acute or significant osseous findings.  IMPRESSION: No acute findings.   Electronically Signed   By: Corlis Leak M.D.   On: 07/01/2018 13:45  CLINICAL DATA:  38 year old female with history of upper abdominal pain worse after eating for 1 month, worsening acutely in the past 3-4 days.  EXAM: ULTRASOUND ABDOMEN LIMITED RIGHT UPPER QUADRANT  COMPARISON:  Abdominal ultrasound 12/22/2009.  FINDINGS: Gallbladder:  No gallstones or wall thickening visualized. No sonographic Murphy sign noted by sonographer.  Common bile duct:  Diameter: 2.8 mm  Liver:  No focal lesion identified. Mildly increased echogenicity, concerning for  hepatic steatosis. Portal vein is patent on color Doppler imaging with normal direction of blood flow towards the liver.  IMPRESSION: 1. Mildly increased hepatic echogenicity concerning for mild hepatic steatosis. 2. No cholelithiasis or findings to suggest an acute cholecystitis at this time.   Electronically Signed   By: Trudie Reed M.D.   On: 07/01/2018 12:07 Assessment:      RUQ pain, with initial workup negative for any GB etiology week ago, but returns again with worsening pain  Plan:     Will proceed with HIDA to further evaluate whether or not GB is main etiology.  Location and history is consistent, but no objective findings yet to indicate GB disease.  Pt has high glucose levels and is not actively being treated for DM.  Will get hospitalist consult for glucose management as well and obtain recs for outpt management until she can establish care elsewhere when it comes time for discharge.

## 2018-07-08 NOTE — Consult Note (Addendum)
[facility] Medical Consultation  Conard NovakDenise L Velasquez ZOX:[id number] DOB: 1981/04/05 DOA: 07/08/2018 PCP: Patient, No Pcp Per   Requesting physician: Tonna BoehringerSakai MD Date of consultation: 07/08/2018 Reason for consultation: Uncontrolled diabetes mellitus  CHIEF COMPLAINT:   Chief Complaint  Patient presents with  . Abdominal Pain    HISTORY OF PRESENT ILLNESS: Mary MillardDenise Velasquez  is a 38 y.o. female with a known history of diabetes mellitus type 2 has been consulted by surgical service for elevated blood sugar.  Patient blood sugar today is 410 mg/dL.  Anion gap is normal.  She is not taking any medication for diabetes.  Patient used to take oral metformi rightn in 2018 for diabetes.  Currently she is on diet control.  Patient has abdominal discomfort in the right upper quadrant.  Pain is aching in nature 5 out of 10 on a scale of 1-10.  Recent CT abdomen did not show any acute pathology.  Patient is getting admitted under surgical service and plan is to HIDA scan.  PAST MEDICAL HISTORY:   Past Medical History:  Diagnosis Date  . Diabetes mellitus without complication (HCC)     PAST SURGICAL HISTORY:  Past Surgical History:  Procedure Laterality Date  . ABDOMINAL SURGERY    . BREAST SURGERY    . CESAREAN SECTION    . TUBAL LIGATION      SOCIAL HISTORY:  Social History   Tobacco Use  . Smoking status: Never Smoker  . Smokeless tobacco: Never Used  Substance Use Topics  . Alcohol use: No    FAMILY HISTORY:  Family History  Problem Relation Age of Onset  . CAD Neg Hx   . Seizures Neg Hx     DRUG ALLERGIES:  Allergies  Allergen Reactions  . Oxycodone   . Oxycontin [Oxycodone Hcl]     REVIEW OF SYSTEMS:   CONSTITUTIONAL: No fever, fatigue or weakness.  EYES: No blurred or double vision.  EARS, NOSE, AND THROAT: No tinnitus or ear pain.  RESPIRATORY: No cough, shortness of breath, wheezing or hemoptysis.  CARDIOVASCULAR: No chest pain, orthopnea, edema.  GASTROINTESTINAL: No  nausea, vomiting, diarrhea  Has abdominal pain.  GENITOURINARY: No dysuria, hematuria.  ENDOCRINE: No polyuria, nocturia,  HEMATOLOGY: No anemia, easy bruising or bleeding SKIN: No rash or lesion. MUSCULOSKELETAL: No joint pain or arthritis.   NEUROLOGIC: No tingling, numbness, weakness.  PSYCHIATRY: No anxiety or depression.   MEDICATIONS AT HOME:  Prior to Admission medications   Medication Sig Start Date End Date Taking? Authorizing Provider  cephALEXin (KEFLEX) 500 MG capsule Take 1 capsule (500 mg total) by mouth 2 (two) times daily. 12/19/17   Loleta RoseForbach, Cory, MD  furosemide (LASIX) 20 MG tablet Take 1 tablet (20 mg total) by mouth daily. 02/15/17 02/15/18  Phineas SemenGoodman, Graydon, MD  metFORMIN (GLUCOPHAGE) 500 MG tablet Take 1 tablet (500 mg total) by mouth 2 (two) times daily with a meal. 08/10/15   Emily FilbertWilliams, Jonathan E, MD  metFORMIN (GLUCOPHAGE) 500 MG tablet Take 1 tablet (500 mg total) by mouth 2 (two) times daily with a meal. 02/15/17 02/15/18  Phineas SemenGoodman, Graydon, MD  methocarbamol (ROBAXIN-750) 750 MG tablet Take 2 tablets (1,500 mg total) by mouth 4 (four) times daily. 06/22/15   Joni ReiningSmith, Ronald K, PA-C  methylPREDNISolone (MEDROL DOSEPAK) 4 MG TBPK tablet Take Tapered dose as directed 06/22/15   Joni ReiningSmith, Ronald K, PA-C  ondansetron [facility](ZOFRAN) 4 MG tablet Take 1-2 tabs by mouth every 8 hours as needed for nausea/vomiting 12/19/17   Loleta RoseForbach, Cory,  MD  pantoprazole (PROTONIX) 20 MG tablet Take 1 tablet (20 mg total) by mouth daily. 07/01/18 07/01/19  Jene Every, MD  sucralfate (CARAFATE) 1 g tablet Take 1 tablet (1 g total) by mouth 4 (four) times daily for 15 days. 07/01/18 07/16/18  Jene Every, MD  traMADol (ULTRAM) 50 MG tablet Take 1 tablet (50 mg total) by mouth every 6 (six) hours as needed for moderate pain. 06/22/15   Joni Reining, PA-C      PHYSICAL EXAMINATION:   VITAL SIGNS: Blood pressure (!) 144/84, pulse 96, temperature 98.3 F (36.8 C), temperature source Oral, resp. rate 18,  height 5' (1.524 m), weight 86.2 kg, last menstrual period 06/22/2018, SpO2 100 %.  GENERAL:  38 y.o.-year-old patient lying in the bed with no acute distress.  EYES: Pupils equal, round, reactive to light and accommodation. No scleral icterus. Extraocular muscles intact.  HEENT: Head atraumatic, normocephalic. Oropharynx and nasopharynx clear.  NECK:  Supple, no jugular venous distention. No thyroid enlargement, no tenderness.  LUNGS: Normal breath sounds bilaterally, no wheezing, rales,rhonchi or crepitation. No use of accessory muscles of respiration.  CARDIOVASCULAR: S1, S2 normal. No murmurs, rubs, or gallops.  ABDOMEN: Soft, tenderness in the right upper quadrant, nondistended. Bowel sounds present. No organomegaly or mass.  EXTREMITIES: No pedal edema, cyanosis, or clubbing.  NEUROLOGIC: Cranial nerves II through XII are intact. Muscle strength 5/5 in all extremities. Sensation intact. Gait not checked.  PSYCHIATRIC: The patient is alert and oriented x 3.  SKIN: No obvious rash, lesion, or ulcer.   LABORATORY PANEL:   CBC Recent Labs  Lab 07/08/18 1508  WBC 7.8  HGB 12.1  HCT 37.9  PLT 293  MCV 79.0*  MCH 25.2*  MCHC 31.9  RDW 14.3   ------------------------------------------------------------------------------------------------------------------  Chemistries  Recent Labs  Lab 07/08/18 1508  NA 135  K 3.8  CL 102  CO2 23  GLUCOSE 419*  BUN 8  CREATININE 0.80  CALCIUM 8.9  AST 19  ALT 12  ALKPHOS 93  BILITOT 0.4   ------------------------------------------------------------------------------------------------------------------ estimated creatinine clearance is 93.9 mL/min (by C-G formula based on SCr of 0.8 mg/dL). ------------------------------------------------------------------------------------------------------------------ No results for input(s): TSH, T4TOTAL, T3FREE, THYROIDAB in the last 72 hours.  Invalid input(s): FREET3   Coagulation  profile No results for input(s): INR, PROTIME in the last 168 hours. ------------------------------------------------------------------------------------------------------------------- No results for input(s): DDIMER in the last 72 hours. -------------------------------------------------------------------------------------------------------------------  Cardiac Enzymes No results for input(s): CKMB, TROPONINI, MYOGLOBIN in the last 168 hours.  Invalid input(s): CK ------------------------------------------------------------------------------------------------------------------ Invalid input(s): POCBNP  ---------------------------------------------------------------------------------------------------------------  Urinalysis    Component Value Date/Time   COLORURINE STRAW (A) 07/08/2018 1739   APPEARANCEUR CLEAR (A) 07/08/2018 1739   APPEARANCEUR Cloudy 03/22/2014 0745   LABSPEC 1.036 (H) 07/08/2018 1739   LABSPEC 1.025 03/22/2014 0745   PHURINE 5.0 07/08/2018 1739   GLUCOSEU >=500 (A) 07/08/2018 1739   GLUCOSEU 100 mg/dL 09/12/9456 5929   HGBUR MODERATE (A) 07/08/2018 1739   BILIRUBINUR NEGATIVE 07/08/2018 1739   BILIRUBINUR Negative 03/22/2014 0745   KETONESUR 5 (A) 07/08/2018 1739   PROTEINUR NEGATIVE 07/08/2018 1739   NITRITE NEGATIVE 07/08/2018 1739   LEUKOCYTESUR NEGATIVE 07/08/2018 1739   LEUKOCYTESUR Negative 03/22/2014 0745     RADIOLOGY: No results found.  EKG: Orders placed or performed during the hospital encounter of 07/01/18  . ED EKG  . ED EKG  . EKG 12-Lead  . EKG 12-Lead  . EKG    IMPRESSION AND PLAN:  38 year old female patient with  history of diabetes mellitus type 2 currently in the ER for abdominal pain stating admitted under surgical service  -Uncontrolled diabetes mellitus Start patient on oral metformin 500 twice daily Sliding scale coverage with insulin for better control of blood sugars Check HbA1c level Diabetic coordinator  evaluation Diabetic teaching  -Abdominal pain HIDA scan as per surgical attending for further evaluation Further management as per surgery  -Increased BMI Weight loss counseling  -Thank you for the consult and will follow the patient  All the records are reviewed and case discussed with ED provider. Management plans discussed with the patient, family and they are in agreement.  CODE STATUS:Full code    Code Status Orders  (From admission, onward)         Start     Ordered   07/08/18 2033  Full code  Continuous     07/08/18 2032        Code Status History    This patient has a current code status but no historical code status.       TOTAL TIME TAKING CARE OF THIS PATIENT: 52 minutes.    Ihor AustinPavan Ceasia Elwell M.D on 07/08/2018 at 8:41 PM  Between 7am to 6pm - Pager - [phone]  After 6pm go to [email or web address] - password [email or web address] [facility]  [facility] Office  [phone]  CC: Primary care physician; Patient, No Pcp Per

## 2018-07-09 ENCOUNTER — Other Ambulatory Visit: Payer: Self-pay

## 2018-07-09 ENCOUNTER — Encounter: Payer: Self-pay | Admitting: Radiology

## 2018-07-09 ENCOUNTER — Observation Stay: Payer: Self-pay

## 2018-07-09 DIAGNOSIS — R1011 Right upper quadrant pain: Secondary | ICD-10-CM

## 2018-07-09 LAB — CBC
HCT: 35.7 % — ABNORMAL LOW (ref 36.0–46.0)
Hemoglobin: 11.1 g/dL — ABNORMAL LOW (ref 12.0–15.0)
MCH: 24.7 pg — ABNORMAL LOW (ref 26.0–34.0)
MCHC: 31.1 g/dL (ref 30.0–36.0)
MCV: 79.5 fL — ABNORMAL LOW (ref 80.0–100.0)
Platelets: 252 10*3/uL (ref 150–400)
RBC: 4.49 MIL/uL (ref 3.87–5.11)
RDW: 14.1 % (ref 11.5–15.5)
WBC: 6.8 10*3/uL (ref 4.0–10.5)
nRBC: 0 % (ref 0.0–0.2)

## 2018-07-09 LAB — FOLATE: FOLATE: 18.7 ng/mL (ref >5.9)

## 2018-07-09 LAB — HEMOGLOBIN A1C
Hgb A1c MFr Bld: 13.3 % — ABNORMAL HIGH (ref 4.8–5.6)
MEAN PLASMA GLUCOSE: 335.01 mg/dL

## 2018-07-09 LAB — HEPATIC FUNCTION PANEL
ALBUMIN: 3 g/dL — AB (ref 3.5–5.0)
ALT: 10 U/L (ref 0–44)
AST: 14 U/L — ABNORMAL LOW (ref 15–41)
Alkaline Phosphatase: 72 U/L (ref 38–126)
BILIRUBIN TOTAL: 0.3 mg/dL (ref 0.3–1.2)
Bilirubin, Direct: <0.1 mg/dL (ref 0.0–0.2)
Total Protein: 6.3 g/dL — ABNORMAL LOW (ref 6.5–8.1)

## 2018-07-09 LAB — MAGNESIUM: Magnesium: 1.7 mg/dL (ref 1.7–2.4)

## 2018-07-09 LAB — GLUCOSE, CAPILLARY
GLUCOSE-CAPILLARY: 236 mg/dL — AB (ref 70–99)
Glucose-Capillary: 249 mg/dL — ABNORMAL HIGH (ref 70–99)
Glucose-Capillary: 98 mg/dL (ref 70–99)
Glucose-Capillary: 191 mg/dL — ABNORMAL HIGH (ref 70–99)

## 2018-07-09 LAB — FERRITIN: Ferritin: 7 ng/mL — ABNORMAL LOW (ref 11–307)

## 2018-07-09 LAB — BASIC METABOLIC PANEL
Anion gap: 6 (ref 5–15)
BUN: 10 mg/dL (ref 6–20)
CO2: 24 mmol/L (ref 22–32)
Calcium: 8.2 mg/dL — ABNORMAL LOW (ref 8.9–10.3)
Chloride: 107 mmol/L (ref 98–111)
Creatinine, Ser: 0.37 mg/dL — ABNORMAL LOW (ref 0.44–1.00)
GFR calc Af Amer: >60 mL/min (ref >60)
GFR calc non Af Amer: >60 mL/min (ref >60)
Glucose, Bld: 196 mg/dL — ABNORMAL HIGH (ref 70–99)
Potassium: 3.3 mmol/L — ABNORMAL LOW (ref 3.5–5.1)
Sodium: 137 mmol/L (ref 135–145)

## 2018-07-09 LAB — SURGICAL PCR SCREEN
MRSA, PCR: NEGATIVE
Staphylococcus aureus: POSITIVE — AB

## 2018-07-09 LAB — IRON AND TIBC
Iron: 28 ug/dL (ref 28–170)
Saturation Ratios: 6 % — ABNORMAL LOW (ref 10.4–31.8)
TIBC: 438 ug/dL (ref 250–450)
UIBC: 411 ug/dL

## 2018-07-09 LAB — VITAMIN B12: Vitamin B-12: 292 pg/mL (ref 180–914)

## 2018-07-09 LAB — PHOSPHORUS: Phosphorus: 3.4 mg/dL (ref 2.5–4.6)

## 2018-07-09 MED ORDER — PANTOPRAZOLE SODIUM 40 MG PO TBEC
40.0000 mg | DELAYED_RELEASE_TABLET | Freq: Two times a day (BID) | ORAL | Status: DC
  Administered 2018-07-09 – 2018-07-10 (×3): 40 mg via ORAL
  Filled 2018-07-09 (×3): qty 1

## 2018-07-09 MED ORDER — GLIMEPIRIDE 2 MG PO TABS
2.0000 mg | ORAL_TABLET | Freq: Every day | ORAL | Status: DC
  Administered 2018-07-09: 2 mg via ORAL
  Filled 2018-07-09 (×2): qty 1

## 2018-07-09 MED ORDER — POTASSIUM CHLORIDE CRYS ER 20 MEQ PO TBCR
40.0000 meq | EXTENDED_RELEASE_TABLET | Freq: Once | ORAL | Status: AC
  Administered 2018-07-09: 40 meq via ORAL
  Filled 2018-07-09: qty 2

## 2018-07-09 MED ORDER — ACETAMINOPHEN 325 MG PO TABS
650.0000 mg | ORAL_TABLET | Freq: Four times a day (QID) | ORAL | Status: DC
  Administered 2018-07-09: 650 mg via ORAL
  Filled 2018-07-09 (×5): qty 2

## 2018-07-09 MED ORDER — TECHNETIUM TC 99M MEBROFENIN IV KIT
5.0000 | PACK | Freq: Once | INTRAVENOUS | Status: AC | PRN
  Administered 2018-07-09: 5.41 via INTRAVENOUS

## 2018-07-09 MED ORDER — LIVING WELL WITH DIABETES BOOK
Freq: Once | Status: DC
  Filled 2018-07-09: qty 1

## 2018-07-09 NOTE — Progress Notes (Signed)
[facility] [place] [facility]   PATIENT NAME: Mary MillardDenise Velasquez    MR#:  [id number]  DATE OF BIRTH:  01-01-1981  SUBJECTIVE:  CHIEF COMPLAINT:  Pt is reporting right upper quadrant pain and epigastric pain.  Reporting taking ibuprofen and naproxen as needed in the past and stopped taking her metformin 1000 mg twice daily several months ago as she lost significant weight  REVIEW OF SYSTEMS:  CONSTITUTIONAL: No fever, fatigue or weakness.  EYES: No blurred or double vision.  EARS, NOSE, AND THROAT: No tinnitus or ear pain.  RESPIRATORY: No cough, shortness of breath, wheezing or hemoptysis.  CARDIOVASCULAR: No chest pain, orthopnea, edema.  GASTROINTESTINAL: No nausea, vomiting, diarrhea.  Reporting epigastric and right upper quadrant abdominal pain.  GENITOURINARY: No dysuria, hematuria.  ENDOCRINE: No polyuria, nocturia,  HEMATOLOGY: No anemia, easy bruising or bleeding SKIN: No rash or lesion. MUSCULOSKELETAL: No joint pain or arthritis.   NEUROLOGIC: No tingling, numbness, weakness.  PSYCHIATRY: No anxiety or depression.   DRUG ALLERGIES:   Allergies  Allergen Reactions  . Oxycodone   . Oxycontin [Oxycodone Hcl]     VITALS:  Blood pressure 122/90, pulse 81, temperature 98.2 F (36.8 C), resp. rate 18, height 5' (1.524 m), weight 86.2 kg, last menstrual period 06/22/2018, SpO2 99 %.  PHYSICAL EXAMINATION:  GENERAL:  38 y.o.-year-old patient lying in the bed with no acute distress.  EYES: Pupils equal, round, reactive to light and accommodation. No scleral icterus. Extraocular muscles intact.  HEENT: Head atraumatic, normocephalic. Oropharynx and nasopharynx clear.  NECK:  Supple, no jugular venous distention. No thyroid enlargement, no tenderness.  LUNGS: Normal breath sounds bilaterally, no wheezing, rales,rhonchi or crepitation. No use of accessory muscles of respiration.  CARDIOVASCULAR: S1, S2 normal. No murmurs, rubs, or gallops.  ABDOMEN:  Soft, RUQ and epigastric area is tender, no rebound tenderness, nondistended. Bowel sounds present.  EXTREMITIES: No pedal edema, cyanosis, or clubbing.  NEUROLOGIC: Awake, alert and oriented x3 sensation intact. Gait not checked.  PSYCHIATRIC: The patient is alert and oriented x 3.  SKIN: No obvious rash, lesion, or ulcer.    LABORATORY PANEL:   CBC Recent Labs  Lab 07/09/18 0315  WBC 6.8  HGB 11.1*  HCT 35.7*  PLT 252   ------------------------------------------------------------------------------------------------------------------  Chemistries  Recent Labs  Lab 07/09/18 0315  NA 137  K 3.3*  CL 107  CO2 24  GLUCOSE 196*  BUN 10  CREATININE 0.37*  CALCIUM 8.2*  MG 1.7  AST 14*  ALT 10  ALKPHOS 72  BILITOT 0.3   ------------------------------------------------------------------------------------------------------------------  Cardiac Enzymes No results for input(s): TROPONINI in the last 168 hours. ------------------------------------------------------------------------------------------------------------------  RADIOLOGY:  Nm Hepato W/eject Fract  Addendum Date: 07/09/2018   ADDENDUM REPORT: 07/09/2018 10:28 ADDENDUM: Ejection fraction data was also obtained. This study should properly be titled: EXAM: Nuclear medicine hepatobiliary imaging study with gallbladder EF The patient consumed 8 ounces of Ensure orally with calculation of the computer generated ejection fraction of radiotracer from the gallbladder. The patient did experience abdominal pain with the oral Ensure consumption. The computer generated ejection fraction of radiotracer from the gallbladder was toward the lower limits of normal at 37%, normal greater than 33 % using the oral agent. REVISED IMPRESSION: Ejection fraction of radiotracer the gallbladder within normal limits although toward the lower limits of normal at 37 %. Patient did experience pain with the oral Ensure consumption. Cystic and common  bile ducts are patent. Electronically Signed   By: Chrissie NoaWilliam  Margarita GrizzleWoodruff III M.D.   On: 07/09/2018 10:28   Result Date: 07/09/2018 CLINICAL DATA:  Right upper quadrant pain with nausea and vomiting EXAM: NUCLEAR MEDICINE HEPATOBILIARY IMAGING TECHNIQUE: Sequential frontal images of the abdomen were obtained out to 60 minutes following intravenous administration of radiopharmaceutical. RADIOPHARMACEUTICALS:  5.41 mCi Tc-3133m  Choletec IV COMPARISON:  None. FINDINGS: Prompt uptake and biliary excretion of activity by the liver is seen. Gallbladder activity is visualized, consistent with patency of cystic duct. Biliary activity passes into small bowel, consistent with patent common bile duct. IMPRESSION: Study within normal limits. Electronically Signed: By: Bretta BangWilliam  Woodruff III M.D. On: 07/09/2018 09:49    EKG:   Orders placed or performed during the hospital encounter of 07/01/18  . ED EKG  . ED EKG  . EKG 12-Lead  . EKG 12-Lead  . EKG    ASSESSMENT AND PLAN:   #Uncontrolled diabetes mellitus due to noncompliance-hemoglobin A1c 13 Patient is restarted on metformin and Amaryl is added to the regimen Reinforced the importance of being compliant with her medications Patient needs strict outpatient follow-up with primary care physician, endocrinology and outpatient diabetic clinic No improvement in hemoglobin A1c she might require insulin Diabetic diet education  #Right upper quadrant and epigastric abdominal pain HIDA scan negative GI consult placed for possible EGD to rule out peptic ulcer disease Clear liquid diet  #Obesity Lifestyle modifications advised   All the records are reviewed and case discussed with Care Management/Social Workerr. Management plans discussed with the patient, family and they are in agreement.   TOTAL TIME TAKING CARE OF THIS PATIENT: 35 minutes.   POSSIBLE D/C IN  DAYS, DEPENDING ON CLINICAL CONDITION.  Note: This dictation was prepared with Dragon  dictation along with smaller phrase technology. Any transcriptional errors that result from this process are unintentional.   Ramonita LabAruna Janiah Devinney M.D on 07/09/2018 at 1:48 PM  Between 7am to 6pm - Pager - [phone] After 6pm go to [email or web address] - password [email or web address]  [facility]  Office  [phone]  CC: Primary care physician; Patient, No Pcp Per

## 2018-07-09 NOTE — Progress Notes (Addendum)
Inpatient Diabetes Program Recommendations  AACE/ADA: New Consensus Statement on Inpatient Glycemic Control  Target Ranges:  Prepandial:   less than 140 mg/dL      Peak postprandial:   less than 180 mg/dL (1-2 hours)      Critically ill patients:  140 - 180 mg/dL  Results for LEKSI, HERTZBERG (MRN [id number]) as of 07/09/2018 07:42  Ref. Range 07/09/2018 03:15  Glucose Latest Ref Range: 70 - 99 mg/dL 810 (H)   Results for VERBA, DEGEN (MRN [id number]) as of 07/09/2018 07:42  Ref. Range 07/08/2018 20:08 07/08/2018 23:03  Glucose-Capillary Latest Ref Range: 70 - 99 mg/dL 277 (H) 824 (H)  Results for KYRSTYN, WECKERLY (MRN [id number]) as of 07/09/2018 07:42  Ref. Range 07/08/2018 15:08  Glucose Latest Ref Range: 70 - 99 mg/dL 154 (H)  Results for NAIJA, NERIO (MRN [id number]) as of 07/09/2018 13:36  Ref. Range 07/09/2018 03:15  Hemoglobin A1C Latest Ref Range: 4.8 - 5.6 % 13.3 (H)   Review of Glycemic Control  Diabetes history: DM2 Outpatient Diabetes medications: None; per H&P use to take Metformin Current orders for Inpatient glycemic control: Metformin 500 mg BID, Novolog 0-15 units TID with meals, Novolog 0-5 units QHS  Inpatient Diabetes Program Recommendations:  Oral Agents: Please consider ordering Amaryl 2 mg daily (in addition to Metformin). HgbA1C: A1C ordered and in process.  NOTE: Noted consult for Diabetes Coordinator. Ordered Living Well with DM book, RD consult for diet education, and CM consult to assist with follow up.  Recommend ordering affordable DM medications for outpatient DM control (recommend Amaryl 2 mg daily and Metformin 500 mg BID). Will plan to see patient today regarding DM control.  Addendum 07/09/18@13 :30-Spoke with patient regarding DM control. Patient states that she was taking Metformin 500 mg BID for DM control but notes that she stopped taking it over 6 months ago. Inquired about any other DM medications in the past and patient states that she had taken Lantus (insulin  pens) in 2015 but she was able to come off the insulin when DM control improved.  Patient confirms that currently she does not have PCP or insurance. Patient states that she is in the process of trying to meet with an insurance agent to see about getting some health insurance. Informed patient that CM has been consulted and has been requested to assist with arranging follow up and perhaps medication needs if needed.  Discussed A1C results (13.3% on 07/09/18) and explained that current A1C indicates an average glucose of 335 mg/dl over the past 2-3 months. Discussed glucose and A1C goals. Discussed importance of checking CBGs and maintaining good CBG control to prevent long-term and short-term complications. Discussed progression of DM2 and explained that she will need to take DM medications consistently.  Discussed Metformin and Amaryl in more detail and how they work to improve DM control. Explained to patient that if her pancreas is not able to make enough insulin to keep glucose controlled, she may need to take insulin as an outpatient.  Discussed impact of nutrition, exercise, stress, sickness, and medications on diabetes control. Patient states that she has not been following a Carb Modified diet recently due to GI issues. Patient reports that she plans to make dietary changes and start back exercising. Informed patient that glycemic control will be followed and if glucose does not improve, she may need to restart taking insulin as an outpatient. Patient is agreeable to take insulin as an outpatient if needed. Informed patient that Diabetes  Coordinator will follow up with her tomorrow.  Patient verbalized understanding of information discussed and she states that she has no further questions at this time related to diabetes.  If patient needs to take insulin as an outpatient, will need to see which insulins are available at [facility] or use NOVOLIN insulins (Novolin 70/30, Novolin R,  Novolin NPH) which are much more affordable if she has to pay for them out of pocket.  Thanks, Orlando Penner, RN, MSN, CDE Diabetes Coordinator Inpatient Diabetes Program [phone] (Team Pager from 8am to 5pm)

## 2018-07-09 NOTE — Progress Notes (Signed)
Initial Nutrition Assessment  DOCUMENTATION CODES:   Obesity unspecified  INTERVENTION:   Recommend Boost Breeze po TID, each supplement provides 250 kcal and 9 grams of protein  Recommend daily MVI  NUTRITION DIAGNOSIS:   Inadequate oral intake related to acute illness as evidenced by other (comment)(pt on clear liquid diet ).  GOAL:   Patient will meet greater than or equal to 90% of their needs  MONITOR:   PO intake, Supplement acceptance, Labs, Weight trends, Skin, I & O's  REASON FOR ASSESSMENT:   Malnutrition Screening Tool, Consult Assessment of nutrition requirement/status  ASSESSMENT:   38 y/o female with T2DM admitted with abdominal pain    Met with pt in room today. Pt reports poor appetite and oral intake for 4-5 weeks pta r/t abdominal pain. Pt reports an 11lb weight loss over the past 5 weeks; there is no recent weight history in chart to confirm weight loss. Pt s/p HIDA scan today; has not yet had anything to eat. Pt reports she feels hungry today and is planning to order a clear liquid tray. Recommend Boost Breeze and MVI to help pt meet her estimated needs until her diet is able to be advanced. Pt does not like Ensure; would prefer mixed berry Boost Breeze.   Medications reviewed and include: insulin, metformin, protonix, KCl, NaCl @75ml /hr  Labs reviewed: K 3.3(L), P 3.4 wnl, Mg 1.7 wnl Hgb 11.1(L), Hct 35.7(L), MCV 79.5(L), MCH 24.7(L) 276, 210, 236 X 24 hrs AIC 13.3- 1/9  NUTRITION - FOCUSED PHYSICAL EXAM:    Most Recent Value  Orbital Region  No depletion  Upper Arm Region  No depletion  Thoracic and Lumbar Region  No depletion  Buccal Region  No depletion  Temple Region  No depletion  Clavicle Bone Region  No depletion  Clavicle and Acromion Bone Region  No depletion  Scapular Bone Region  No depletion  Dorsal Hand  No depletion  Patellar Region  No depletion  Anterior Thigh Region  No depletion  Posterior Calf Region  No depletion  Edema  (RD Assessment)  Mild  Hair  Reviewed  Eyes  Reviewed  Mouth  Reviewed  Skin  Reviewed  Nails  Reviewed     Diet Order:   Diet Order            Diet NPO time specified  Diet effective midnight        Diet clear liquid Room service appropriate? Yes  Diet effective now             EDUCATION NEEDS:   Education needs have been addressed  Skin:  Skin Assessment: Reviewed RN Assessment  Last BM:  1/8  Height:   Ht Readings from Last 1 Encounters:  07/08/18 5' (1.524 m)    Weight:   Wt Readings from Last 1 Encounters:  07/08/18 86.2 kg    Ideal Body Weight:  45.45 kg  BMI:  Body mass index is 37.11 kg/m.  Estimated Nutritional Needs:   Kcal:  1700-1900kcal/day   Protein:  86-95g/day   Fluid:  >1.5L/day   Koleen Distance MS, RD, LDN Pager #- [phone] Office#- [phone] After Hours Pager: [phone]

## 2018-07-09 NOTE — Consult Note (Signed)
Cephas Darby, MD [address]  [place]  [place], [place] [address]  Main: [phone]  Fax: [phone] Pager: [phone]   Consultation  Referring Provider:     No ref. provider found Primary Care Physician:  Patient, No Pcp Per Primary Gastroenterologist: None         Reason for Consultation:     Right upper quadrant pain  Date of Admission:  07/08/2018 Date of Consultation:  07/09/2018         HPI:   Mary Velasquez is a 38 y.o. female with poorly controlled diabetes, hemoglobin A1c 13.3 admitted with persistent right upper quadrant pain associated with nausea.  Patient was initially seen in the ER on 07/01/2018 for this complaint and underwent work-up including LFTs, CBC, lipase which are normal, ultrasound and CT revealed normal pancreatic hepatobiliary pathology.  HIDA scan was normal.  Patient was initially evaluated by general surgery, Dr. Lysle Pearl who felt that her pain is not secondary to gallbladder etiology.  Therefore, he consulted GI for further evaluation given her history of poorly controlled diabetes Patient is on clear liquid diet, on Protonix 40 mg twice daily, she has mild microcytic anemia. She denies melena, rectal bleeding or hematochezia.  She also reports mild epigastric discomfort and moving to left upper quadrant.  She was resting comfortably when I saw her.  She is tolerating clear liquid diet well  NSAIDs: None  Antiplts/Anticoagulants/Anti thrombotics: None  GI Procedures: None  Past Medical History:  Diagnosis Date  . Diabetes mellitus without complication [facility])     Past Surgical History:  Procedure Laterality Date  . ABDOMINAL SURGERY    . BREAST SURGERY    . CESAREAN SECTION    . TUBAL LIGATION      Prior to Admission medications   Medication Sig Start Date End Date Taking? Authorizing Provider  pantoprazole (PROTONIX) 20 MG tablet Take 1 tablet (20 mg total) by mouth daily. 07/01/18 07/01/19 Yes Lavonia Drafts, MD  sucralfate  (CARAFATE) 1 g tablet Take 1 tablet (1 g total) by mouth 4 (four) times daily for 15 days. 07/01/18 07/16/18 Yes Lavonia Drafts, MD  cephALEXin (KEFLEX) 500 MG capsule Take 1 capsule (500 mg total) by mouth 2 (two) times daily. Patient not taking: Reported on 07/08/2018 12/19/17   Hinda Kehr, MD  furosemide (LASIX) 20 MG tablet Take 1 tablet (20 mg total) by mouth daily. 02/15/17 02/15/18  Nance Pear, MD  metFORMIN (GLUCOPHAGE) 500 MG tablet Take 1 tablet (500 mg total) by mouth 2 (two) times daily with a meal. Patient not taking: Reported on 07/08/2018 08/10/15   Earleen Newport, MD  metFORMIN (GLUCOPHAGE) 500 MG tablet Take 1 tablet (500 mg total) by mouth 2 (two) times daily with a meal. 02/15/17 02/15/18  Nance Pear, MD  methocarbamol (ROBAXIN-750) 750 MG tablet Take 2 tablets (1,500 mg total) by mouth 4 (four) times daily. Patient not taking: Reported on 07/08/2018 06/22/15   Sable Feil, PA-C  methylPREDNISolone (MEDROL DOSEPAK) 4 MG TBPK tablet Take Tapered dose as directed Patient not taking: Reported on 07/08/2018 06/22/15   Sable Feil, PA-C  ondansetron [facility]) 4 MG tablet Take 1-2 tabs by mouth every 8 hours as needed for nausea/vomiting Patient not taking: Reported on 07/08/2018 12/19/17   Hinda Kehr, MD  traMADol (ULTRAM) 50 MG tablet Take 1 tablet (50 mg total) by mouth every 6 (six) hours as needed for moderate pain. Patient not taking: Reported on 07/08/2018 06/22/15  Sable Feil, PA-C    Current Facility-Administered Medications:  .  0.9 %  sodium chloride infusion, , Intravenous, Continuous, Pyreddy, Pavan, MD, Last Rate: 75 mL/hr at 07/09/18 1250 .  acetaminophen (TYLENOL) tablet 650 mg, 650 mg, Oral, Q6H, Sakai, Isami, DO .  celecoxib (CELEBREX) capsule 200 mg, 200 mg, Oral, Daily, Sakai, Isami, DO, 200 mg at 07/09/18 1040 .  gabapentin (NEURONTIN) capsule 200 mg, 200 mg, Oral, BID PRN, Sakai, Isami, DO .  glimepiride (AMARYL) tablet 2 mg, 2 mg, Oral, Q  breakfast, Gouru, Aruna, MD, 2 mg at 07/09/18 1040 .  insulin aspart (novoLOG) injection 0-15 Units, 0-15 Units, Subcutaneous, TID WC, Sakai, Isami, DO, 5 Units at 07/09/18 1244 .  insulin aspart (novoLOG) injection 0-5 Units, 0-5 Units, Subcutaneous, QHS, Sakai, Isami, DO, 2 Units at 07/09/18 0010 .  living well with diabetes book MISC, , Does not apply, Once, Gouru, Aruna, MD .  metFORMIN (GLUCOPHAGE) tablet 500 mg, 500 mg, Oral, BID WC, Pyreddy, Pavan, MD, 500 mg at 07/09/18 1040 .  morphine 2 MG/ML injection 2 mg, 2 mg, Intravenous, Q4H PRN, Sakai, Isami, DO, 2 mg at 07/09/18 1413 .  mupirocin ointment (BACTROBAN) 2 % 1 application, 1 application, Nasal, BID, Sakai, Isami, DO, 1 application at 24/26/83 1041 .  ondansetron (ZOFRAN-ODT) disintegrating tablet 4 mg, 4 mg, Oral, Q6H PRN **OR** ondansetron (ZOFRAN) injection 4 mg, 4 mg, Intravenous, Q6H PRN, Sakai, Isami, DO .  pantoprazole (PROTONIX) EC tablet 40 mg, 40 mg, Oral, BID, Sakai, Isami, DO .  polyethylene glycol (MIRALAX / GLYCOLAX) packet 17 g, 17 g, Oral, Daily PRN, Sakai, Isami, DO .  simethicone (MYLICON) chewable tablet 40 mg, 40 mg, Oral, Q6H PRN, Sakai, Isami, DO .  traMADol (ULTRAM) tablet 50 mg, 50 mg, Oral, Q6H PRN, Sakai, Isami, DO, 50 mg at 07/09/18 1047  Family History  Problem Relation Age of Onset  . CAD Neg Hx   . Seizures Neg Hx      Social History   Tobacco Use  . Smoking status: Never Smoker  . Smokeless tobacco: Never Used  Substance Use Topics  . Alcohol use: No  . Drug use: Not on file    Allergies as of 07/08/2018 - Review Complete 07/08/2018  Allergen Reaction Noted  . Oxycodone  06/22/2015  . Oxycontin [oxycodone hcl]  12/19/2017    Review of Systems:    All systems reviewed and negative except where noted in HPI.   Physical Exam:  Vital signs in last 24 hours: Temp:  [97.8 F (36.6 C)-98.2 F (36.8 C)] 98.2 F (36.8 C) (01/09 1203) Pulse Rate:  [74-97] 81 (01/09 1203) Resp:  [14-18] 18  (01/09 1203) BP: (100-150)/(68-91) 122/90 (01/09 1203) SpO2:  [99 %-100 %] 99 % (01/09 1203) Last BM Date: 07/08/18 General:   Pleasant, cooperative in NAD Head:  Normocephalic and atraumatic. Eyes:   No icterus.   Conjunctiva pink. PERRLA. Ears:  Normal auditory acuity. Neck:  Supple; no masses or thyroidomegaly Lungs: Respirations even and unlabored. Lungs clear to auscultation bilaterally.   No wheezes, crackles, or rhonchi.  Heart:  Regular rate and rhythm;  Without murmur, clicks, rubs or gallops Abdomen:  Soft, nondistended, mild to moderate tenderness in right upper quadrant, epigastric areas. Normal bowel sounds. No appreciable masses or hepatomegaly.  No rebound or guarding.  Rectal:  Not performed. Msk:  Symmetrical without gross deformities.  Strength normal Extremities:  Without edema, cyanosis or clubbing. Neurologic:  Alert and oriented x3;  grossly  normal neurologically. Skin:  Intact without significant lesions or rashes. Psych:  Alert and cooperative. Normal affect.  LAB RESULTS: CBC Latest Ref Rng & Units 07/09/2018 07/08/2018 07/01/2018  WBC 4.0 - 10.5 K/uL 6.8 7.8 7.4  Hemoglobin 12.0 - 15.0 g/dL 11.1(L) 12.1 12.3  Hematocrit 36.0 - 46.0 % 35.7(L) 37.9 39.6  Platelets 150 - 400 K/uL 252 293 262    BMET BMP Latest Ref Rng & Units 07/09/2018 07/08/2018 07/01/2018  Glucose 70 - 99 mg/dL 196(H) 419(H) 315(H)  BUN 6 - 20 mg/dL 10 8 16   Creatinine 0.44 - 1.00 mg/dL 0.37(L) 0.80 0.52  Sodium 135 - 145 mmol/L 137 135 135  Potassium 3.5 - 5.1 mmol/L 3.3(L) 3.8 4.1  Chloride 98 - 111 mmol/L 107 102 105  CO2 22 - 32 mmol/L 24 23 21(L)  Calcium 8.9 - 10.3 mg/dL 8.2(L) 8.9 8.8(L)    LFT Hepatic Function Latest Ref Rng & Units 07/09/2018 07/08/2018 07/01/2018  Total Protein 6.5 - 8.1 g/dL 6.3(L) 7.2 7.4  Albumin 3.5 - 5.0 g/dL 3.0(L) 3.4(L) 3.6  AST 15 - 41 U/L 14(L) 19 16  ALT 0 - 44 U/L 10 12 13   Alk Phosphatase 38 - 126 U/L 72 93 99  Total Bilirubin 0.3 - 1.2 mg/dL 0.3 0.4 0.4    Bilirubin, Direct 0.0 - 0.2 mg/dL <0.1 - -     STUDIES: Nm Hepato W/eject Fract  Addendum Date: 07/09/2018   ADDENDUM REPORT: 07/09/2018 10:28 ADDENDUM: Ejection fraction data was also obtained. This study should properly be titled: EXAM: Nuclear medicine hepatobiliary imaging study with gallbladder EF The patient consumed 8 ounces of Ensure orally with calculation of the computer generated ejection fraction of radiotracer from the gallbladder. The patient did experience abdominal pain with the oral Ensure consumption. The computer generated ejection fraction of radiotracer from the gallbladder was toward the lower limits of normal at 37%, normal greater than 33 % using the oral agent. REVISED IMPRESSION: Ejection fraction of radiotracer the gallbladder within normal limits although toward the lower limits of normal at 37 %. Patient did experience pain with the oral Ensure consumption. Cystic and common bile ducts are patent. Electronically Signed   By: Lowella Grip III M.D.   On: 07/09/2018 10:28   Result Date: 07/09/2018 CLINICAL DATA:  Right upper quadrant pain with nausea and vomiting EXAM: NUCLEAR MEDICINE HEPATOBILIARY IMAGING TECHNIQUE: Sequential frontal images of the abdomen were obtained out to 60 minutes following intravenous administration of radiopharmaceutical. RADIOPHARMACEUTICALS:  5.41 mCi Tc-58m Choletec IV COMPARISON:  None. FINDINGS: Prompt uptake and biliary excretion of activity by the liver is seen. Gallbladder activity is visualized, consistent with patency of cystic duct. Biliary activity passes into small bowel, consistent with patent common bile duct. IMPRESSION: Study within normal limits. Electronically Signed: By: WLowella GripIII M.D. On: 07/09/2018 09:49      Impression / Plan:   Mary ENCKis a 38y.o. African-American female with poorly controlled diabetes mellitus, hemoglobin A1c 13.3 presents with 1 month history of right upper quadrant and epigastric  pain associated with nausea.  Patient evaluated by general surgery who did not recommend cholecystectomy as work-up was unremarkable for gallbladder etiology.  Patient is started on insulin during this admission.  Right upper quadrant pain: Differentials include peptic ulcer disease or gastroparesis or H. pylori gastritis or functional dyspepsia  -Recommend EGD for further evaluation -Continue Protonix 40 mg twice daily -Okay with clear liquid diet -N.p.o. past midnight -Tight control of diabetes  I have discussed alternative options, risks & benefits,  which include, but are not limited to, bleeding, infection, perforation,respiratory complication & drug reaction.  The patient agrees with this plan & written consent will be obtained.    Thank you for involving me in the care of this patient.      LOS: 0 days   Sherri Sear, MD  07/09/2018, 4:55 PM   Note: This dictation was prepared with Dragon dictation along with smaller phrase technology. Any transcriptional errors that result from this process are unintentional.

## 2018-07-09 NOTE — Plan of Care (Signed)
Nutrition Education Note   RD consulted for nutrition education regarding diabetes.   38 y/o female with h/o DM admitted with abdominal pain   Lab Results  Component Value Date   HGBA1C 13.3 (H) 07/09/2018    RD provided "Nutrition and Type II Diabetes" handout from the Academy of Nutrition and Dietetics. Discussed different food groups and their effects on blood sugar, emphasizing carbohydrate-containing foods. Provided list of carbohydrates and recommended serving sizes of common foods.  Discussed importance of controlled and consistent carbohydrate intake throughout the day. Provided examples of ways to balance meals/snacks and encouraged intake of high-fiber, whole grain complex carbohydrates. Teach back method used.  Expect fair compliance.  Body mass index is 37.11 kg/m. Pt meets criteria for obesity based on current BMI.  RD following this pt   Betsey Holiday MS, RD, LDN Pager #[phone] Office#- [phone] After Hours Pager: [phone]

## 2018-07-09 NOTE — H&P (Signed)
Sung Amabile, DO  Physician  Surgery  Consult Note  Signed  Date of Service:  07/08/2018 8:00 PM          Signed         Show:Clear all [x] Manual[x] Template[x] Copied  Added by: [x] Sung Amabile, DO  [] Hover for details Subjective:   CC: RUQ pain  HPI:  Mary Velasquez is a 38 y.o. female who is consulted by [facility] for evaluation of above cc.  Symptoms were first noted 1 month ago. Pain is sharp, waxing and waning but overall getting worse.   Associated with nausea, exacerbated by nothing specific.  Complaining of clay colored stools  Past Medical History:  has a past medical history of Diabetes mellitus without complication (HCC).  Past Surgical History:  has a past surgical history that includes Abdominal surgery; Breast surgery; Cesarean section; and Tubal ligation.  Family History: sister with cholecystitis  Social History:  reports that she has never smoked. She has never used smokeless tobacco. She reports that she does not drink alcohol. No history on file for drug.  Current Medications: none  Allergies:       Allergies as of 07/08/2018 - Review Complete 07/08/2018  Allergen Reaction Noted  . Oxycodone  06/22/2015  . Oxycontin [oxycodone hcl]  12/19/2017    ROS:  General: Denies weight loss, weight gain, fatigue, fevers, chills, and night sweats. Eyes: Denies blurry vision, double vision, eye pain, itchy eyes, and tearing. Ears: Denies hearing loss, earache, and ringing in ears. Nose: Denies sinus pain, congestion, infections, runny nose, and nosebleeds. Mouth/throat: Denies hoarseness, sore throat, bleeding gums, and difficulty swallowing. Heart: Denies chest pain, palpitations, racing heart, irregular heartbeat, leg pain or swelling, and decreased activity tolerance. Respiratory: Denies breathing difficulty, shortness of breath, wheezing, cough, and sputum. GI: Denies change in appetite, heartburn, nausea, vomiting, constipation, diarrhea,  and blood in stool. GU: Denies difficulty urinating, pain with urinating, urgency, frequency, blood in urine. Musculoskeletal: Denies joint stiffness, pain, swelling, muscle weakness, and pain. Skin: Denies rash, itching, mass, tumors, sores, and boils Neurologic: Denies headache, fainting, dizziness, seizures, numbness, and tingling. Psychiatric: Denies depression, anxiety, difficulty sleeping, and memory loss. Endocrine: Denies heat or cold intolerance, and increased thirst or urination. Blood/lymph: Denies easy bruising, easy bruising, and swollen glands     Objective:   BP (!) 144/84 (BP Location: Right Arm)   Pulse 96   Temp 98.3 F (36.8 C) (Oral)   Resp 18   Ht 5' (1.524 m)   Wt 86.2 kg   LMP 06/22/2018   SpO2 100%   BMI 37.11 kg/m    Constitutional :  alert, cooperative, appears stated age and no distress  Lymphatics/Throat:  no asymmetry, masses, or scars  Respiratory:  clear to auscultation bilaterally  Cardiovascular:  regular rate and rhythm  Gastrointestinal: soft, focal tenderness in the RUQ.   Musculoskeletal: Steady gait and movement  Skin: Cool and moist  Psychiatric: Normal affect, non-agitated, not confused       LABS:  CMP Latest Ref Rng & Units 07/08/2018 07/01/2018 12/19/2017  Glucose 70 - 99 mg/dL 258(N) 277(O) 242(P)  BUN 6 - 20 mg/dL 8 16 12   Creatinine 0.44 - 1.00 mg/dL 5.36 1.44 3.15  Sodium 135 - 145 mmol/L 135 135 135  Potassium 3.5 - 5.1 mmol/L 3.8 4.1 3.6  Chloride 98 - 111 mmol/L 102 105 103  CO2 22 - 32 mmol/L 23 21(L) 24  Calcium 8.9 - 10.3 mg/dL 8.9 4.0(G) 8.6(P)  Total Protein 6.5 -  8.1 g/dL 7.2 7.4 6.7  Total Bilirubin 0.3 - 1.2 mg/dL 0.4 0.4 0.4  Alkaline Phos 38 - 126 U/L 93 99 85  AST 15 - 41 U/L 19 16 16   ALT 0 - 44 U/L 12 13 10(L)   CBC Latest Ref Rng & Units 07/08/2018 07/01/2018 12/19/2017  WBC 4.0 - 10.5 K/uL 7.8 7.4 6.9  Hemoglobin 12.0 - 15.0 g/dL 81.1 91.4 11.1(L)  Hematocrit 36.0 - 46.0 % 37.9 39.6 33.7(L)    Platelets 150 - 400 K/uL 293 262 273     RADS: CLINICAL DATA: intermittent RUQ pain that is now constant. Pain is worse after eating. Still has gallbladder. Fever 101 last night but gone this morning. Has had vomiting. No drinking last night per pt. No hx pancreatitis.  EXAM: CT ABDOMEN AND PELVIS WITH CONTRAST  TECHNIQUE: Multidetector CT imaging of the abdomen and pelvis was performed using the standard protocol following bolus administration of intravenous contrast.  CONTRAST: ISOVUE-300 IOPAMIDOL (ISOVUE-300) INJECTION 61%  COMPARISON: Ultrasound from earlier the same day  FINDINGS: Lower chest: No acute abnormality.  Hepatobiliary: No focal liver abnormality is seen. No gallstones, gallbladder wall thickening, or biliary dilatation.  Pancreas: Unremarkable. No pancreatic ductal dilatation or surrounding inflammatory changes.  Spleen: Normal in size without focal abnormality.  Adrenals/Urinary Tract: Adrenal glands are unremarkable. Kidneys are normal, without renal calculi, focal lesion, or hydronephrosis. Bladder is unremarkable.  Stomach/Bowel: Stomach is incompletely distended. Small bowel is nondilated. Appendix not discretely identified. Colon is unremarkable.  Vascular/Lymphatic: No significant vascular findings are present. No enlarged abdominal or pelvic lymph nodes.  Reproductive: Uterus unremarkable. Bilateral tubal ligation clips. Probable right ovarian corpus luteum cyst.  Other: No ascites. No free air.  Musculoskeletal: No acute or significant osseous findings.  IMPRESSION: No acute findings.   Electronically Signed By: Corlis Leak M.D. On: 07/01/2018 13:45  CLINICAL DATA: 38 year old female with history of upper abdominal pain worse after eating for 1 month, worsening acutely in the past 3-4 days.  EXAM: ULTRASOUND ABDOMEN LIMITED RIGHT UPPER QUADRANT  COMPARISON: Abdominal ultrasound  12/22/2009.  FINDINGS: Gallbladder:  No gallstones or wall thickening visualized. No sonographic Murphy sign noted by sonographer.  Common bile duct:  Diameter: 2.8 mm  Liver:  No focal lesion identified. Mildly increased echogenicity, concerning for hepatic steatosis. Portal vein is patent on color Doppler imaging with normal direction of blood flow towards the liver.  IMPRESSION: 1. Mildly increased hepatic echogenicity concerning for mild hepatic steatosis. 2. No cholelithiasis or findings to suggest an acute cholecystitis at this time.   Electronically Signed By: Trudie Reed M.D. On: 07/01/2018 12:07 Assessment:      RUQ pain, with initial workup negative for any GB etiology week ago, but returns again with worsening pain  Plan:   Will proceed with HIDA to further evaluate whether or not GB is main etiology.  Location and history is consistent, but no objective findings yet to indicate GB disease.  Pt has high glucose levels and is not actively being treated for DM.  Will get hospitalist consult for glucose management as well and obtain recs for outpt management until she can establish care elsewhere when it comes time for discharge.           Routing History

## 2018-07-10 ENCOUNTER — Encounter
Admission: EM | Disposition: A | Discharge status: Home / Self Care | Payer: Self-pay | Source: Home / Self Care | Attending: Emergency Medicine

## 2018-07-10 ENCOUNTER — Encounter: Payer: Self-pay | Admitting: *Deleted

## 2018-07-10 ENCOUNTER — Observation Stay: Payer: Self-pay | Admitting: Anesthesiology

## 2018-07-10 DIAGNOSIS — R1013 Epigastric pain: Secondary | ICD-10-CM

## 2018-07-10 HISTORY — PX: ESOPHAGOGASTRODUODENOSCOPY: SHX5428

## 2018-07-10 LAB — HEPATIC FUNCTION PANEL
ALT: 11 U/L (ref 0–44)
AST: 16 U/L (ref 15–41)
Albumin: 3 g/dL — ABNORMAL LOW (ref 3.5–5.0)
Alkaline Phosphatase: 74 U/L (ref 38–126)
Bilirubin, Direct: <0.1 mg/dL (ref 0.0–0.2)
Total Bilirubin: 0.5 mg/dL (ref 0.3–1.2)
Total Protein: 6.5 g/dL (ref 6.5–8.1)

## 2018-07-10 LAB — BASIC METABOLIC PANEL
Anion gap: 5 (ref 5–15)
BUN: 6 mg/dL (ref 6–20)
CO2: 23 mmol/L (ref 22–32)
Calcium: 8.3 mg/dL — ABNORMAL LOW (ref 8.9–10.3)
Chloride: 108 mmol/L (ref 98–111)
Creatinine, Ser: 0.52 mg/dL (ref 0.44–1.00)
GFR calc Af Amer: >60 mL/min (ref >60)
GFR calc non Af Amer: >60 mL/min (ref >60)
GLUCOSE: 229 mg/dL — AB (ref 70–99)
Potassium: 3.9 mmol/L (ref 3.5–5.1)
Sodium: 136 mmol/L (ref 135–145)

## 2018-07-10 LAB — GLUCOSE, CAPILLARY
GLUCOSE-CAPILLARY: 212 mg/dL — AB (ref 70–99)
Glucose-Capillary: 211 mg/dL — ABNORMAL HIGH (ref 70–99)
Glucose-Capillary: 124 mg/dL — ABNORMAL HIGH (ref 70–99)
Glucose-Capillary: 233 mg/dL — ABNORMAL HIGH (ref 70–99)

## 2018-07-10 LAB — CBC
HCT: 38 % (ref 36.0–46.0)
Hemoglobin: 11.9 g/dL — ABNORMAL LOW (ref 12.0–15.0)
MCH: 24.8 pg — ABNORMAL LOW (ref 26.0–34.0)
MCHC: 31.3 g/dL (ref 30.0–36.0)
MCV: 79.3 fL — ABNORMAL LOW (ref 80.0–100.0)
Platelets: 250 10*3/uL (ref 150–400)
RBC: 4.79 MIL/uL (ref 3.87–5.11)
RDW: 14.3 % (ref 11.5–15.5)
WBC: 6.1 10*3/uL (ref 4.0–10.5)
nRBC: 0 % (ref 0.0–0.2)

## 2018-07-10 LAB — HIV ANTIBODY (ROUTINE TESTING W REFLEX): HIV Screen 4th Generation wRfx: NONREACTIVE

## 2018-07-10 LAB — PHOSPHORUS: Phosphorus: 3 mg/dL (ref 2.5–4.6)

## 2018-07-10 LAB — MAGNESIUM: Magnesium: 1.7 mg/dL (ref 1.7–2.4)

## 2018-07-10 SURGERY — EGD (ESOPHAGOGASTRODUODENOSCOPY)
Anesthesia: General

## 2018-07-10 MED ORDER — PROPOFOL 10 MG/ML IV BOLUS
INTRAVENOUS | Status: DC | PRN
  Administered 2018-07-10 (×2): 100 mg via INTRAVENOUS

## 2018-07-10 MED ORDER — METFORMIN HCL 500 MG PO TABS: 500.0000 mg | ORAL_TABLET | Freq: Two times a day (BID) | ORAL | 0 refills | Status: DC

## 2018-07-10 MED ORDER — ENOXAPARIN SODIUM 40 MG/0.4ML ~~LOC~~ SOLN: 40.0000 mg | SUBCUTANEOUS | Status: DC

## 2018-07-10 MED ORDER — CELECOXIB 200 MG PO CAPS: 200.0000 mg | ORAL_CAPSULE | Freq: Every day | ORAL | 0 refills | Status: AC | PRN

## 2018-07-10 MED ORDER — GLIMEPIRIDE 4 MG PO TABS: 4.0000 mg | ORAL_TABLET | Freq: Every day | ORAL | 0 refills | Status: DC

## 2018-07-10 MED ORDER — ACETAMINOPHEN 325 MG PO TABS: 650.0000 mg | ORAL_TABLET | Freq: Four times a day (QID) | ORAL | 0 refills | Status: AC | PRN

## 2018-07-10 MED ORDER — GLIMEPIRIDE 4 MG PO TABS: 4.0000 mg | ORAL_TABLET | Freq: Every day | ORAL | Status: DC

## 2018-07-10 MED ORDER — PANTOPRAZOLE SODIUM 40 MG PO TBEC: 40.0000 mg | DELAYED_RELEASE_TABLET | Freq: Two times a day (BID) | ORAL | 0 refills | Status: DC

## 2018-07-10 MED ORDER — PROPOFOL 500 MG/50ML IV EMUL
INTRAVENOUS | Status: AC
  Filled 2018-07-10: qty 50

## 2018-07-10 MED ORDER — DOCUSATE SODIUM 100 MG PO CAPS: 100.0000 mg | ORAL_CAPSULE | Freq: Two times a day (BID) | ORAL | 0 refills | Status: AC | PRN

## 2018-07-10 MED ORDER — TRAMADOL HCL 50 MG PO TABS: 50.0000 mg | ORAL_TABLET | Freq: Four times a day (QID) | ORAL | 0 refills | Status: AC | PRN

## 2018-07-10 MED ORDER — PROPOFOL 500 MG/50ML IV EMUL
INTRAVENOUS | Status: DC | PRN
  Administered 2018-07-10: 150 ug/kg/min via INTRAVENOUS

## 2018-07-10 MED ORDER — LIDOCAINE HCL (CARDIAC) PF 100 MG/5ML IV SOSY
PREFILLED_SYRINGE | INTRAVENOUS | Status: DC | PRN
  Administered 2018-07-10: 50 mg via INTRAVENOUS

## 2018-07-10 MED ORDER — LIDOCAINE HCL (PF) 2 % IJ SOLN
INTRAMUSCULAR | Status: AC
  Filled 2018-07-10: qty 10

## 2018-07-10 MED ORDER — ONDANSETRON 4 MG PO TBDP: 4.0000 mg | ORAL_TABLET | Freq: Four times a day (QID) | ORAL | 0 refills | Status: DC | PRN

## 2018-07-10 NOTE — Anesthesia Postprocedure Evaluation (Signed)
Anesthesia Post Note  Patient: Mary Velasquez  Procedure(s) Performed: ESOPHAGOGASTRODUODENOSCOPY (EGD) (N/A )  Patient location during evaluation: PACU Anesthesia Type: General Level of consciousness: awake and alert Pain management: pain level controlled Vital Signs Assessment: post-procedure vital signs reviewed and stable Respiratory status: spontaneous breathing, nonlabored ventilation, respiratory function stable and patient connected to nasal cannula oxygen Cardiovascular status: blood pressure returned to baseline and stable Postop Assessment: no apparent nausea or vomiting Anesthetic complications: no     Last Vitals:  Vitals:   07/10/18 1435 07/10/18 1454  BP: 98/73 94/69  Pulse: 83 82  Resp: (!) 23 16  Temp:  36.8 C  SpO2: 99% 100%    Last Pain:  Vitals:   07/10/18 1454  TempSrc: Oral  PainSc:                  [name]

## 2018-07-10 NOTE — Progress Notes (Signed)
[facility] [place] [facility]   PATIENT NAME: Mary Velasquez    MR#:  [id number]  DATE OF BIRTH:  January 19, 1981  SUBJECTIVE:  CHIEF COMPLAINT:  Pt is reporting right upper quadrant pain and epigastric pain are much better.  Had EGD today which is normal    REVIEW OF SYSTEMS:  CONSTITUTIONAL: No fever, fatigue or weakness.  EYES: No blurred or double vision.  EARS, NOSE, AND THROAT: No tinnitus or ear pain.  RESPIRATORY: No cough, shortness of breath, wheezing or hemoptysis.  CARDIOVASCULAR: No chest pain, orthopnea, edema.  GASTROINTESTINAL: No nausea, vomiting, diarrhea.  Reporting epigastric and right upper quadrant abdominal pain.  GENITOURINARY: No dysuria, hematuria.  ENDOCRINE: No polyuria, nocturia,  HEMATOLOGY: No anemia, easy bruising or bleeding SKIN: No rash or lesion. MUSCULOSKELETAL: No joint pain or arthritis.   NEUROLOGIC: No tingling, numbness, weakness.  PSYCHIATRY: No anxiety or depression.   DRUG ALLERGIES:   Allergies  Allergen Reactions  . Oxycodone   . Oxycontin [Oxycodone Hcl]     VITALS:  Blood pressure 94/69, pulse 82, temperature 98.3 F (36.8 C), temperature source Oral, resp. rate 16, height 5' (1.524 m), weight 86.2 kg, last menstrual period 06/22/2018, SpO2 100 %.  PHYSICAL EXAMINATION:  GENERAL:  37 y.o.-year-old patient lying in the bed with no acute distress.  EYES: Pupils equal, round, reactive to light and accommodation. No scleral icterus. Extraocular muscles intact.  HEENT: Head atraumatic, normocephalic. Oropharynx and nasopharynx clear.  NECK:  Supple, no jugular venous distention. No thyroid enlargement, no tenderness.  LUNGS: Normal breath sounds bilaterally, no wheezing, rales,rhonchi or crepitation. No use of accessory muscles of respiration.  CARDIOVASCULAR: S1, S2 normal. No murmurs, rubs, or gallops.  ABDOMEN: Soft, RUQ and epigastric area is tender, no rebound tenderness, nondistended. Bowel sounds  present.  EXTREMITIES: No pedal edema, cyanosis, or clubbing.  NEUROLOGIC: Awake, alert and oriented x3 sensation intact. Gait not checked.  PSYCHIATRIC: The patient is alert and oriented x 3.  SKIN: No obvious rash, lesion, or ulcer.    LABORATORY PANEL:   CBC Recent Labs  Lab 07/10/18 0555  WBC 6.1  HGB 11.9*  HCT 38.0  PLT 250   ------------------------------------------------------------------------------------------------------------------  Chemistries  Recent Labs  Lab 07/10/18 0555  NA 136  K 3.9  CL 108  CO2 23  GLUCOSE 229*  BUN 6  CREATININE 0.52  CALCIUM 8.3*  MG 1.7  AST 16  ALT 11  ALKPHOS 74  BILITOT 0.5   ------------------------------------------------------------------------------------------------------------------  Cardiac Enzymes No results for input(s): TROPONINI in the last 168 hours. ------------------------------------------------------------------------------------------------------------------  RADIOLOGY:  Nm Hepato W/eject Fract  Addendum Date: 07/09/2018   ADDENDUM REPORT: 07/09/2018 10:28 ADDENDUM: Ejection fraction data was also obtained. This study should properly be titled: EXAM: Nuclear medicine hepatobiliary imaging study with gallbladder EF The patient consumed 8 ounces of Ensure orally with calculation of the computer generated ejection fraction of radiotracer from the gallbladder. The patient did experience abdominal pain with the oral Ensure consumption. The computer generated ejection fraction of radiotracer from the gallbladder was toward the lower limits of normal at 37%, normal greater than 33 % using the oral agent. REVISED IMPRESSION: Ejection fraction of radiotracer the gallbladder within normal limits although toward the lower limits of normal at 37 %. Patient did experience pain with the oral Ensure consumption. Cystic and common bile ducts are patent. Electronically Signed   By: Bretta Bang III M.D.   On: 07/09/2018  10:28   Result Date:  07/09/2018 CLINICAL DATA:  Right upper quadrant pain with nausea and vomiting EXAM: NUCLEAR MEDICINE HEPATOBILIARY IMAGING TECHNIQUE: Sequential frontal images of the abdomen were obtained out to 60 minutes following intravenous administration of radiopharmaceutical. RADIOPHARMACEUTICALS:  5.41 mCi Tc-7m  Choletec IV COMPARISON:  None. FINDINGS: Prompt uptake and biliary excretion of activity by the liver is seen. Gallbladder activity is visualized, consistent with patency of cystic duct. Biliary activity passes into small bowel, consistent with patent common bile duct. IMPRESSION: Study within normal limits. Electronically Signed: By: Bretta Bang III M.D. On: 07/09/2018 09:49    EKG:   Orders placed or performed during the hospital encounter of 07/01/18  . ED EKG  . ED EKG  . EKG 12-Lead  . EKG 12-Lead  . EKG    ASSESSMENT AND PLAN:   #Uncontrolled diabetes mellitus due to noncompliance-hemoglobin A1c 13 Patient is restarted on metformin and Amaryl is added to the regimen, dose increased to 4 mg once daily Reinforced the importance of being compliant with her medications Patient needs strict outpatient follow-up with primary care physician, PCP to refer the patient to endocrinology and outpatient diabetic clinic No improvement in hemoglobin A1c she might require insulin Diabetic diet education provided  #Right upper quadrant and epigastric abdominal pain Clinically better HIDA scan negative GI consult placed, EGD negative  #Obesity Lifestyle modifications advised   We will sign off  All the records are reviewed and case discussed with Care Management/Social Workerr. Management plans discussed with the patient, family and they are in agreement.   TOTAL TIME TAKING CARE OF THIS PATIENT: 33 minutes.   POSSIBLE D/C IN  DAYS, DEPENDING ON CLINICAL CONDITION.  Note: This dictation was prepared with Dragon dictation along with smaller phrase technology.  Any transcriptional errors that result from this process are unintentional.   Ramonita Lab M.D on 07/10/2018 at 3:51 PM  Between 7am to 6pm - Pager - [phone] After 6pm go to [email or web address] - password [email or web address]  [facility]  Office  [phone]  CC: Primary care physician; Patient, No Pcp Per

## 2018-07-10 NOTE — Discharge Summary (Signed)
Physician Discharge Summary  Patient ID: Mary Velasquez MRN: [id number] DOB/AGE: [birth year] 38 y.o.  Admit date: 07/08/2018 Discharge date: 07/10/2018  Admission Diagnoses: RUQ abdominal pain  Discharge Diagnoses:  Same as above  Discharged Condition: good  Hospital Course: RUQ pain workup including [place],  CT A/P, HIDA, labs all negative for gallbladder etiology.  Noted to have uncontrolled DM with A1c of 13.5 on admission.  Hospitalist consulted and restarted on metformin, added amaryl, better control by discharge.  GI consulted after GB workup and EGD performed, which was negative for obvious pathology.  H. Pylori biopsy pending.  Pt pain tolerable and ok with diet, so will be sending home with close f/u with primary and GI.  Pt verbalized she will like to be discharged if possible today, so is in agreement with plan  Consults: GI and hospitalist  Discharge Exam: Blood pressure 94/69, pulse 82, temperature 98.3 F (36.8 C), temperature source Oral, resp. rate 16, height 5' (1.524 m), weight 86.2 kg, last menstrual period 06/22/2018, SpO2 100 %. General appearance: alert, cooperative and no distress GI: soft, improved tenderness in epigastric, RUQ area  Disposition:  Discharge disposition: 01-Home or Self Care       Discharge Instructions    Discharge patient   Complete by:  As directed    Discharge disposition:  01-Home or Self Care   Discharge patient date:  07/10/2018     Allergies as of 07/10/2018      Reactions   Oxycodone    Oxycontin [oxycodone Hcl]       Medication List    STOP taking these medications   cephALEXin 500 MG capsule Commonly known as:  KEFLEX   methocarbamol 750 MG tablet Commonly known as:  ROBAXIN-750   methylPREDNISolone 4 MG Tbpk tablet Commonly known as:  MEDROL DOSEPAK   ondansetron 4 MG tablet Commonly known as:  ZOFRAN   sucralfate 1 g tablet Commonly known as:  CARAFATE     TAKE these medications   acetaminophen 325 MG  tablet Commonly known as:  TYLENOL Take 2 tablets (650 mg total) by mouth every 6 (six) hours as needed for up to 10 days for mild pain.   celecoxib 200 MG capsule Commonly known as:  CELEBREX Take 1 capsule (200 mg total) by mouth daily as needed for up to 10 days for moderate pain.   docusate sodium 100 MG capsule Commonly known as:  COLACE Take 1 capsule (100 mg total) by mouth 2 (two) times daily as needed for up to 10 days for mild constipation.   furosemide 20 MG tablet Commonly known as:  LASIX Take 1 tablet (20 mg total) by mouth daily.   glimepiride 4 MG tablet Commonly known as:  AMARYL Take 1 tablet (4 mg total) by mouth daily with breakfast for 30 days. Start taking on:  July 11, 2018   metFORMIN 500 MG tablet Commonly known as:  GLUCOPHAGE Take 1 tablet (500 mg total) by mouth 2 (two) times daily with a meal for 30 days. What changed:  Another medication with the same name was removed. Continue taking this medication, and follow the directions you see here.   ondansetron 4 MG disintegrating tablet Commonly known as:  ZOFRAN-ODT Take 1 tablet (4 mg total) by mouth every 6 (six) hours as needed for nausea.   pantoprazole 40 MG tablet Commonly known as:  PROTONIX Take 1 tablet (40 mg total) by mouth 2 (two) times daily for 30 days. What changed:  medication strength  how much to take  when to take this   traMADol 50 MG tablet Commonly known as:  ULTRAM Take 1 tablet (50 mg total) by mouth every 6 (six) hours as needed for up to 3 days for severe pain. What changed:  reasons to take this      Follow-up Information    Vanga, Loel Dubonnet, MD Follow up in 3 week(s).   Specialty:  Gastroenterology Contact information: [address] [place] [address] [phone]        Primary Care physician Follow up in 1 week(s).            Total time spent arranging discharge was >41min. Signed: Sung Amabile 07/10/2018, 3:57 PM

## 2018-07-10 NOTE — Care Management (Signed)
Patient to discharge today.  Patient provided coupons from [facility].  Prescriptions were e scribed to [facility].  [facility] prices were significantly higher that other pharmacies.  Patient requested for coupons to be printed for Mary Velasquez since they are lower in cost and she can afford them.  Both sets of coupons were provided, along with an application to [facility]. Bedside RN has reached out MD to request printed prescriptions to provide to patient

## 2018-07-10 NOTE — Progress Notes (Signed)
Patient discharge teaching given, including activity, diet, follow-up appoints, and medications. Patient verbalized understanding of all discharge instructions. IV access was d/c'd. Vitals are stable. Skin is intact except as charted in most recent assessments. Pt to be escorted out by NT, to be driven home by family.  Mary Velasquez  

## 2018-07-10 NOTE — Progress Notes (Addendum)
Inpatient Diabetes Program Recommendations  AACE/ADA: New Consensus Statement on Inpatient Glycemic Control   Target Ranges:  Prepandial:   less than 140 mg/dL      Peak postprandial:   less than 180 mg/dL (1-2 hours)      Critically ill patients:  140 - 180 mg/dL   Results for Mary Velasquez, Mary Velasquez (MRN [id number]) as of 07/10/2018 08:05  Ref. Range 07/09/2018 10:23 07/09/2018 12:04 07/09/2018 16:43 07/09/2018 22:16 07/10/2018 07:49  Glucose-Capillary Latest Ref Range: 70 - 99 mg/dL 163 (H) 846 (H) 98 659 (H) 212 (H)  Results for Mary Velasquez, Mary Velasquez (MRN [id number]) as of 07/10/2018 08:05  Ref. Range 07/09/2018 03:15  Hemoglobin A1C Latest Ref Range: 4.8 - 5.6 % 13.3 (H)   Review of Glycemic Control  Diabetes history: DM2 Outpatient Diabetes medications: None; use to take Metformin 500 mg BID Current orders for Inpatient glycemic control: Metformin 500 mg BID, Novolog 0-15 units TID with meals, Novolog 0-5 units QHS  Inpatient Diabetes Program Recommendations:   Oral Agents: Please consider increasing Amaryl to 4 mg daily (in addition to Metformin).  HgbA1C: A1C 13.3% on 07/09/18 indicating an average glucose of 335 mg/dl over the past 2-3 months.   Outpatient DM plan:  MD, please indicate in progress note if patient will be discharged on insulin. If so, please inform patient and nursing staff so patient can be re-educated on insulin administration while inpatient.  NOTE: Spoke with patient on 07/09/18 and explained that glycemic control will be followed and if glucose does not improve, she may need to restart taking insulin as an outpatient. Patient is agreeable to take insulin as an outpatient if needed.  If patient needs to take insulin as an outpatient, will need to see which insulins are available at [facility] or use NOVOLIN insulins (Novolin 70/30, Novolin R, Novolin NPH) which are much more affordable if she has to pay for them out of pocket.  Addendum 07/10/18-Went by to speak with  patient again. Patient is currently off the unit for a procedure.  Spoke with Mary Shames, RN about DM plan for outpatient. It is unclear at this time if patient will be discharged on insulin. Informed RN that patient has taken insulin in the past (with an insulin pen) and asked that insulin administration be reviewed with patient (with vial/syringe) in case patient is discharged on insulin.    Thanks, Mary Penner, RN, MSN, CDE Diabetes Coordinator Inpatient Diabetes Program [phone] (Team Pager from 8am to 5pm)

## 2018-07-10 NOTE — Discharge Instructions (Signed)
Abdominal Pain, Adult  Abdominal pain can be caused by many things. Often, abdominal pain is not serious and it gets better with no treatment or by being treated at home. However, sometimes abdominal pain is serious. Your health care provider will do a medical history and a physical exam to try to determine the cause of your abdominal pain.  Follow these instructions at home:   Take over-the-counter and prescription medicines only as told by your health care provider. Do not take a laxative unless told by your health care provider.   Drink enough fluid to keep your urine clear or pale yellow.   Watch your condition for any changes.   Keep all follow-up visits as told by your health care provider. This is important.  Contact a health care provider if:   Your abdominal pain changes or gets worse.   You are not hungry or you lose weight without trying.   You are constipated or have diarrhea for more than 2-3 days.   You have pain when you urinate or have a bowel movement.   Your abdominal pain wakes you up at night.   Your pain gets worse with meals, after eating, or with certain foods.   You are throwing up and cannot keep anything down.   You have a fever.  Get help right away if:   Your pain does not go away as soon as your health care provider told you to expect.   You cannot stop throwing up.   Your pain is only in areas of the abdomen, such as the right side or the left lower portion of the abdomen.   You have bloody or black stools, or stools that look like tar.   You have severe pain, cramping, or bloating in your abdomen.   You have signs of dehydration, such as:  ? Dark urine, very little urine, or no urine.  ? Cracked lips.  ? Dry mouth.  ? Sunken eyes.  ? Sleepiness.  ? Weakness.  This information is not intended to replace advice given to you by your health care provider. Make sure you discuss any questions you have with your health care provider.  Document Released: 03/27/2005 Document  Revised: 01/05/2016 Document Reviewed: 11/29/2015  Elsevier Interactive Patient Education  2019 [employer]

## 2018-07-10 NOTE — Op Note (Signed)
[facility] Gastroenterology Patient Name: Mary Velasquez Procedure Date: 07/10/2018 1:51 PM MRN: [id number] Account #: [id number] Date of Birth: 01/26/1981 Admit Type: Inpatient Age: 38 Room: [facility] ENDO ROOM 2 Gender: Female Note Status: Finalized Procedure:            Upper GI endoscopy Indications:          Epigastric abdominal pain, Abdominal pain in the right                        upper quadrant Providers:            Lin Landsman MD, MD Referring MD:         No Local Md, MD (Referring MD) Medicines:            Monitored Anesthesia Care Complications:        No immediate complications. Estimated blood loss:                        Minimal. Procedure:            Pre-Anesthesia Assessment:                       - Prior to the procedure, a History and Physical was                        performed, and patient medications and allergies were                        reviewed. The patient is competent. The risks and                        benefits of the procedure and the sedation options and                        risks were discussed with the patient. All questions                        were answered and informed consent was obtained.                        Patient identification and proposed procedure were                        verified by the physician, the nurse, the                        anesthesiologist, the anesthetist and the technician in                        the pre-procedure area in the procedure room in the                        endoscopy suite. Mental Status Examination: alert and                        oriented. Airway Examination: normal oropharyngeal                        airway and neck mobility. Respiratory Examination:  clear to auscultation. CV Examination: normal.                        Prophylactic Antibiotics: The patient does not require                        prophylactic antibiotics. Prior Anticoagulants: The                         patient has taken no previous anticoagulant or                        antiplatelet agents. ASA Grade Assessment: III - A                        patient with severe systemic disease. After reviewing                        the risks and benefits, the patient was deemed in                        satisfactory condition to undergo the procedure. The                        anesthesia plan was to use monitored anesthesia care                        (MAC). Immediately prior to administration of                        medications, the patient was re-assessed for adequacy                        to receive sedatives. The heart rate, respiratory rate,                        oxygen saturations, blood pressure, adequacy of                        pulmonary ventilation, and response to care were                        monitored throughout the procedure. The physical status                        of the patient was re-assessed after the procedure.                       After obtaining informed consent, the endoscope was                        passed under direct vision. Throughout the procedure,                        the patient's blood pressure, pulse, and oxygen                        saturations were monitored continuously. The Endoscope  was introduced through the mouth, and advanced to the                        second part of duodenum. The upper GI endoscopy was                        accomplished without difficulty. The patient tolerated                        the procedure well. Findings:      The duodenal bulb and second portion of the duodenum were normal.       Biopsies were taken with a cold forceps for histology.      The entire examined stomach was normal. Biopsies were taken with a cold       forceps for Helicobacter pylori testing.      The cardia and gastric fundus were normal on retroflexion.      The gastroesophageal junction and examined  esophagus were normal.      Esophagogastric landmarks were identified: the gastroesophageal junction       was found at 35 cm from the incisors. Impression:           - Normal duodenal bulb and second portion of the                        duodenum. Biopsied.                       - Normal stomach. Biopsied.                       - Normal gastroesophageal junction and esophagus. Recommendation:       - Return patient to hospital ward for possible                        discharge same day.                       - Diabetic (ADA) diet.                       - Continue present medications.                       - Await pathology results.                       - Continue PPI BID as outpt                       - Return to GI clinic in 4 weeks. Procedure Code(s):    --- Professional ---                       [phone], Esophagogastroduodenoscopy, flexible, transoral;                        with biopsy, single or multiple Diagnosis Code(s):    --- Professional ---                       R10.13, Epigastric pain  R10.11, Right upper quadrant pain CPT copyright 2018 [employer] Medical [employer]. All rights reserved. The codes documented in this report are preliminary and upon coder review may  be revised to meet current compliance requirements. Dr. Ulyess Mort Lin Landsman MD, MD 07/10/2018 2:07:10 PM This report has been signed electronically. Number of Addenda: 0 Note Initiated On: 07/10/2018 1:51 PM      [facility]

## 2018-07-10 NOTE — Anesthesia Post-op Follow-up Note (Signed)
Anesthesia QCDR form completed.        

## 2018-07-10 NOTE — Transfer of Care (Signed)
Immediate Anesthesia Transfer of Care Note  Patient: Mary Velasquez  Procedure(s) Performed: ESOPHAGOGASTRODUODENOSCOPY (EGD) (N/A )  Patient Location: PACU and Endoscopy Unit  Anesthesia Type:General  Level of Consciousness: awake  Airway & Oxygen Therapy: Patient Spontanous Breathing  Post-op Assessment: Report given to RN  Post vital signs: stable  Last Vitals:  Vitals Value Taken Time  BP 201/173 07/10/2018  2:15 PM  Temp    Pulse 91 07/10/2018  2:16 PM  Resp 15 07/10/2018  2:16 PM  SpO2 100 % 07/10/2018  2:16 PM  Vitals shown include unvalidated device data.  Last Pain:  Vitals:   07/10/18 1159  TempSrc: Oral  PainSc:       Patients Stated Pain Goal: 1 (07/10/18 1100)  Complications: No apparent anesthesia complications

## 2018-07-10 NOTE — Anesthesia Preprocedure Evaluation (Addendum)
Anesthesia Evaluation  Patient identified by MRN, date of birth, ID band Patient awake    Reviewed: Allergy & Precautions, NPO status , Patient's Chart, lab work & pertinent test results  History of Anesthesia Complications Negative for: history of anesthetic complications  Airway Mallampati: III       Dental  (+) Teeth Intact   Pulmonary neg sleep apnea, neg COPD,           Cardiovascular (-) hypertension(-) Past MI and (-) CHF (-) dysrhythmias (-) Valvular Problems/Murmurs     Neuro/Psych neg Seizures    GI/Hepatic Neg liver ROS, GERD  Medicated and Controlled,  Endo/Other  diabetes, Poorly Controlled, Type 2, Oral Hypoglycemic Agents  Renal/GU negative Renal ROS     Musculoskeletal   Abdominal   Peds  Hematology   Anesthesia Other Findings obese  Reproductive/Obstetrics                            Anesthesia Physical Anesthesia Plan  ASA: III  Anesthesia Plan: General   Post-op Pain Management:    Induction: Intravenous  PONV Risk Score and Plan: 3 and TIVA, Propofol infusion and Treatment may vary due to age or medical condition  Airway Management Planned: Nasal Cannula  Additional Equipment:   Intra-op Plan:   Post-operative Plan:   Informed Consent: I have reviewed the patients History and Physical, chart, labs and discussed the procedure including the risks, benefits and alternatives for the proposed anesthesia with the patient or authorized representative who has indicated his/her understanding and acceptance.     Plan Discussed with: Anesthesiologist  Anesthesia Plan Comments:        Anesthesia Quick Evaluation

## 2018-07-14 LAB — SURGICAL PATHOLOGY

## 2018-07-15 ENCOUNTER — Telehealth: Payer: Self-pay | Admitting: Gastroenterology

## 2018-07-15 NOTE — Telephone Encounter (Signed)
Pt is returning a call for results

## 2018-07-21 NOTE — Telephone Encounter (Signed)
Procedure results were discussed with pt on 07/15/18. See results message.

## 2018-07-29 ENCOUNTER — Encounter: Payer: Self-pay | Admitting: Gastroenterology

## 2018-07-29 ENCOUNTER — Ambulatory Visit: Payer: Self-pay | Admitting: Gastroenterology

## 2018-07-29 VITALS — BP 134/99 | HR 92 | Resp 17 | Ht 60.0 in | Wt 194.2 lb

## 2018-07-29 DIAGNOSIS — R1011 Right upper quadrant pain: Secondary | ICD-10-CM

## 2018-07-29 DIAGNOSIS — R1013 Epigastric pain: Secondary | ICD-10-CM

## 2018-07-29 DIAGNOSIS — R0989 Other specified symptoms and signs involving the circulatory and respiratory systems: Secondary | ICD-10-CM

## 2018-07-29 NOTE — Progress Notes (Signed)
Arlyss Repress, MD [address]  [address]  [place], [place] [address]  Main: [phone]  Fax: [phone]    Gastroenterology Consultation  Referring Provider:     No ref. provider found Primary Care Physician:  Patient, No Pcp Per Primary Gastroenterologist:  Dr. Arlyss Repress Reason for Consultation:     Dyspepsia, hospital follow-up        HPI:   Mary Velasquez is a 38 y.o. female presented as a hospital follow-up of right upper quadrant pain and nausea.  She has metabolic syndrome, poorly controlled diabetes, hemoglobin A1c 13.3, recently admitted to [facility] beginning of January secondary to persistent right upper quadrant pain and nausea.  Work-up was unrevealing for pancreatic hepatobiliary pathology.  HIDA scan was normal.  She was evaluated by general surgery who felt that the pain is not secondary to gallbladder etiology.  Subsequently, patient underwent upper endoscopy which revealed mild increase in intraepithelial lymphocytes with focal mild to enteritis, nonspecific in the duodenum.  There was no H. pylori infection.  Patient tolerated the diet and was discharged home on Protonix 40 mg twice daily  Interval summary: Patient continues to have right upper quadrant discomfort, constant and sometimes worse after eating.  She also reports new onset of globus sensation, constant sensation of food stuck in her throat.  She takes Protonix 40 mg twice daily.  She is trying to modify her diet given diagnosis of diabetes.  She is currently on metformin 100 mg twice daily.  She has not found PCP yet.  NSAIDs: None  Antiplts/Anticoagulants/Anti thrombotics: None  GI Procedures: EGD 07/10/2018 Normal esophagus, stomach and duodenum  DIAGNOSIS:  A. DUODENUM; COLD BIOPSY:  - SMALL INTESTINAL MUCOSA WITH MILD INCREASE IN INTRAEPITHELIAL  LYMPHOCYTES WITH FOCAL MILD ACTIVE ENTERITIS, NON-SPECIFIC.  - NEGATIVE FOR VIRAL CYTOPATHIC EFFECT, DYSPLASIA, PARASITES, AND  MALIGNANCY.     B. STOMACH, RANDOM; COLD BIOPSY:  - MILD CHRONIC NON-SPECIFIC GASTRITIS.  - NEGATIVE FOR H. PYLORI, DYSPLASIA, AND MALIGNANCY.    Past Medical History:  Diagnosis Date  . Diabetes mellitus without complication [facility])     Past Surgical History:  Procedure Laterality Date  . ABDOMINAL SURGERY    . BREAST SURGERY    . CESAREAN SECTION    . ESOPHAGOGASTRODUODENOSCOPY N/A 07/10/2018   Procedure: ESOPHAGOGASTRODUODENOSCOPY (EGD);  Surgeon: Toney Reil, MD;  [facility] ENDOSCOPY;  Service: Gastroenterology;  Laterality: N/A;  . TUBAL LIGATION      Current Outpatient Medications:  .  glimepiride (AMARYL) 4 MG tablet, Take 1 tablet (4 mg total) by mouth daily with breakfast for 30 days., Disp: 30 tablet, Rfl: 0 .  metFORMIN (GLUCOPHAGE) 500 MG tablet, Take 1 tablet (500 mg total) by mouth 2 (two) times daily with a meal for 30 days., Disp: 60 tablet, Rfl: 0 .  ondansetron (ZOFRAN-ODT) 4 MG disintegrating tablet, Take 1 tablet (4 mg total) by mouth every 6 (six) hours as needed for nausea., Disp: 20 tablet, Rfl: 0 .  pantoprazole (PROTONIX) 40 MG tablet, Take 1 tablet (40 mg total) by mouth 2 (two) times daily for 30 days., Disp: 60 tablet, Rfl: 0 .  furosemide (LASIX) 20 MG tablet, Take 1 tablet (20 mg total) by mouth daily., Disp: 4 tablet, Rfl: 0    Family History  Problem Relation Age of Onset  . CAD Neg Hx   . Seizures Neg Hx      Social History   Tobacco Use  . Smoking status: Never Smoker  .  Smokeless tobacco: Never Used  Substance Use Topics  . Alcohol use: No  . Drug use: Not Currently    Allergies as of 07/29/2018 - Review Complete 07/29/2018  Allergen Reaction Noted  . Oxycodone  06/22/2015  . Oxycontin [oxycodone hcl]  12/19/2017    Review of Systems:    All systems reviewed and negative except where noted in HPI.   Physical Exam:  BP (!) 134/99 (BP Location: Left Arm, Patient Position: Sitting, Cuff Size: Large)   Pulse 92   Resp 17   Ht 5'  (1.524 m)   Wt 194 lb 3.2 oz (88.1 kg)   BMI 37.93 kg/m  No LMP recorded.  General:   Alert,  Well-developed, well-nourished, pleasant and cooperative in NAD Head:  Normocephalic and atraumatic. Eyes:  Sclera clear, no icterus.   Conjunctiva pink. Ears:  Normal auditory acuity. Nose:  No deformity, discharge, or lesions. Mouth:  No deformity or lesions,oropharynx pink & moist. Neck:  Supple; no masses or thyromegaly. Lungs:  Respirations even and unlabored.  Clear throughout to auscultation.   No wheezes, crackles, or rhonchi. No acute distress. Heart:  Regular rate and rhythm; no murmurs, clicks, rubs, or gallops. Abdomen:  Normal bowel sounds. Soft, non-tender and non-distended without masses, hepatosplenomegaly or hernias noted.  No guarding or rebound tenderness.   Rectal: Not performed Msk:  Symmetrical without gross deformities. Good, equal movement & strength bilaterally. Pulses:  Normal pulses noted. Extremities:  No clubbing or edema.  No cyanosis. Neurologic:  Alert and oriented x3;  grossly normal neurologically. Skin:  Intact without significant lesions or rashes. No jaundice. Psych:  Alert and cooperative. Normal mood and affect.  Imaging Studies: Reviewed  Assessment and Plan:   Mary Velasquez is a 38 y.o. female with 7month h/o right upper quadrant discomfort, new onset of diabetes, HbA1c 13.3 currently on metformin.  She may have a component of gastroparesis contributing to her symptoms  Advised to follow strict diabetic diet Switch from Protonix to omeprazole 20 mg twice daily Establish care with PCP  Follow up in 3 months   Arlyss Repress, MD

## 2018-07-29 NOTE — Patient Instructions (Signed)
Diabetes Mellitus and Nutrition, Adult When you have diabetes (diabetes mellitus), it is very important to have healthy eating habits because your blood sugar (glucose) levels are greatly affected by what you eat and drink. Eating healthy foods in the appropriate amounts, at about the same times every day, can help you:  Control your blood glucose.  Lower your risk of heart disease.  Improve your blood pressure.  Reach or maintain a healthy weight. Every person with diabetes is different, and each person has different needs for a meal plan. Your health care provider may recommend that you work with a diet and nutrition specialist (dietitian) to make a meal plan that is best for you. Your meal plan may vary depending on factors such as:  The calories you need.  The medicines you take.  Your weight.  Your blood glucose, blood pressure, and cholesterol levels.  Your activity level.  Other health conditions you have, such as heart or kidney disease. How do carbohydrates affect me? Carbohydrates, also called carbs, affect your blood glucose level more than any other type of food. Eating carbs naturally raises the amount of glucose in your blood. Carb counting is a method for keeping track of how many carbs you eat. Counting carbs is important to keep your blood glucose at a healthy level, especially if you use insulin or take certain oral diabetes medicines. It is important to know how many carbs you can safely have in each meal. This is different for every person. Your dietitian can help you calculate how many carbs you should have at each meal and for each snack. Foods that contain carbs include:  Bread, cereal, rice, pasta, and crackers.  Potatoes and corn.  Peas, beans, and lentils.  Milk and yogurt.  Fruit and juice.  Desserts, such as cakes, cookies, ice cream, and candy. How does alcohol affect me? Alcohol can cause a sudden decrease in blood glucose (hypoglycemia),  especially if you use insulin or take certain oral diabetes medicines. Hypoglycemia can be a life-threatening condition. Symptoms of hypoglycemia (sleepiness, dizziness, and confusion) are similar to symptoms of having too much alcohol. If your health care provider says that alcohol is safe for you, follow these guidelines:  Limit alcohol intake to no more than 1 drink per day for nonpregnant women and 2 drinks per day for men. One drink equals 12 oz of beer, 5 oz of wine, or 1 oz of hard liquor.  Do not drink on an empty stomach.  Keep yourself hydrated with water, diet soda, or unsweetened iced tea.  Keep in mind that regular soda, juice, and other mixers may contain a lot of sugar and must be counted as carbs. What are tips for following this plan?  Reading food labels  Start by checking the serving size on the "Nutrition Facts" label of packaged foods and drinks. The amount of calories, carbs, fats, and other nutrients listed on the label is based on one serving of the item. Many items contain more than one serving per package.  Check the total grams (g) of carbs in one serving. You can calculate the number of servings of carbs in one serving by dividing the total carbs by 15. For example, if a food has 30 g of total carbs, it would be equal to 2 servings of carbs.  Check the number of grams (g) of saturated and trans fats in one serving. Choose foods that have low or no amount of these fats.  Check the number of   milligrams (mg) of salt (sodium) in one serving. Most people should limit total sodium intake to less than 2,300 mg per day.  Always check the nutrition information of foods labeled as "low-fat" or "nonfat". These foods may be higher in added sugar or refined carbs and should be avoided.  Talk to your dietitian to identify your daily goals for nutrients listed on the label. Shopping  Avoid buying canned, premade, or processed foods. These foods tend to be high in fat, sodium,  and added sugar.  Shop around the outside edge of the grocery store. This includes fresh fruits and vegetables, bulk grains, fresh meats, and fresh dairy. Cooking  Use low-heat cooking methods, such as baking, instead of high-heat cooking methods like deep frying.  Cook using healthy oils, such as olive, canola, or sunflower oil.  Avoid cooking with butter, cream, or high-fat meats. Meal planning  Eat meals and snacks regularly, preferably at the same times every day. Avoid going long periods of time without eating.  Eat foods high in fiber, such as fresh fruits, vegetables, beans, and whole grains. Talk to your dietitian about how many servings of carbs you can eat at each meal.  Eat 4-6 ounces (oz) of lean protein each day, such as lean meat, chicken, fish, eggs, or tofu. One oz of lean protein is equal to: ? 1 oz of meat, chicken, or fish. ? 1 egg. ?  cup of tofu.  Eat some foods each day that contain healthy fats, such as avocado, nuts, seeds, and fish. Lifestyle  Check your blood glucose regularly.  Exercise regularly as told by your health care provider. This may include: ? 150 minutes of moderate-intensity or vigorous-intensity exercise each week. This could be brisk walking, biking, or water aerobics. ? Stretching and doing strength exercises, such as yoga or weightlifting, at least 2 times a week.  Take medicines as told by your health care provider.  Do not use any products that contain nicotine or tobacco, such as cigarettes and e-cigarettes. If you need help quitting, ask your health care provider.  Work with a Veterinary surgeon or diabetes educator to identify strategies to manage stress and any emotional and social challenges. Questions to ask a health care provider  Do I need to meet with a diabetes educator?  Do I need to meet with a dietitian?  What number can I call if I have questions?  When are the best times to check my blood glucose? Where to find more  information:  American Diabetes Association: diabetes.org  Academy of Nutrition and Dietetics: www.eatright.[employer] of Diabetes and Digestive and Kidney Diseases (NIH): CarFlippers.tn Summary  A healthy meal plan will help you control your blood glucose and maintain a healthy lifestyle.  Working with a diet and nutrition specialist (dietitian) can help you make a meal plan that is best for you.  Keep in mind that carbohydrates (carbs) and alcohol have immediate effects on your blood glucose levels. It is important to count carbs and to use alcohol carefully. This information is not intended to replace advice given to you by your health care provider. Make sure you discuss any questions you have with your health care provider. Document Released: 03/14/2005 Document Revised: 01/15/2017 Document Reviewed: 07/22/2016 Elsevier Interactive Patient Education  2019 Elsevier Inc. High-Fiber Diet Fiber, also called dietary fiber, is a type of carbohydrate that is found in fruits, vegetables, whole grains, and beans. A high-fiber diet can have many health benefits. Your health care provider may  recommend a high-fiber diet to help:  Prevent constipation. Fiber can make your bowel movements more regular.  Lower your cholesterol.  Relieve the following conditions: ? Swelling of veins in the anus (hemorrhoids). ? Swelling and irritation (inflammation) of specific areas of the digestive tract (uncomplicated diverticulosis). ? A problem of the large intestine (colon) that sometimes causes pain and diarrhea (irritable bowel syndrome, IBS).  Prevent overeating as part of a weight-loss plan.  Prevent heart disease, type 2 diabetes, and certain cancers. What is my plan? The recommended daily fiber intake in grams (g) includes:  38 g for men age 38 or younger.  30 g for men over age 38.  25 g for women age 38 or younger.  21 g for women over age 38. You can get the recommended  daily intake of dietary fiber by:  Eating a variety of fruits, vegetables, grains, and beans.  Taking a fiber supplement, if it is not possible to get enough fiber through your diet. What do I need to know about a high-fiber diet?  It is better to get fiber through food sources rather than from fiber supplements. There is not a lot of research about how effective supplements are.  Always check the fiber content on the nutrition facts label of any prepackaged food. Look for foods that contain 5 g of fiber or more per serving.  Talk with a diet and nutrition specialist (dietitian) if you have questions about specific foods that are recommended or not recommended for your medical condition, especially if those foods are not listed below.  Gradually increase how much fiber you consume. If you increase your intake of dietary fiber too quickly, you may have bloating, cramping, or gas.  Drink plenty of water. Water helps you to digest fiber. What are tips for following this plan?  Eat a wide variety of high-fiber foods.  Make sure that half of the grains that you eat each day are whole grains.  Eat breads and cereals that are made with whole-grain flour instead of refined flour or white flour.  Eat brown rice, bulgur wheat, or millet instead of white rice.  Start the day with a breakfast that is high in fiber, such as a cereal that contains 5 g of fiber or more per serving.  Use beans in place of meat in soups, salads, and pasta dishes.  Eat high-fiber snacks, such as berries, raw vegetables, nuts, and popcorn.  Choose whole fruits and vegetables instead of processed forms like juice or sauce. What foods can I eat?  Fruits Berries. Pears. Apples. Oranges. Avocado. Prunes and raisins. Dried figs. Vegetables Sweet potatoes. Spinach. Kale. Artichokes. Cabbage. Broccoli. Cauliflower. [place] peas. Carrots. Squash. Grains Whole-grain breads. Multigrain cereal. Oats and oatmeal. Brown rice.  Barley. Bulgur wheat. Millet. Quinoa. Bran muffins. Popcorn. Rye wafer crackers. Meats and other proteins Navy, kidney, and pinto beans. Soybeans. Split peas. Lentils. Nuts and seeds. Dairy Fiber-fortified yogurt. Beverages Fiber-fortified soy milk. Fiber-fortified orange juice. Other foods Fiber bars. The items listed above may not be a complete list of recommended foods and beverages. Contact a dietitian for more options. What foods are not recommended? Fruits Fruit juice. Cooked, strained fruit. Vegetables Fried potatoes. Canned vegetables. Well-cooked vegetables. Grains White bread. Pasta made with refined flour. White rice. Meats and other proteins Fatty cuts of meat. Fried chicken or fried fish. Dairy Milk. Yogurt. Cream cheese. Sour cream. Fats and oils Butters. Beverages Soft drinks. Other foods Cakes and pastries. The items listed above may  not be a complete list of foods and beverages to avoid. Contact a dietitian for more information. Summary  Fiber is a type of carbohydrate. It is found in fruits, vegetables, whole grains, and beans.  There are many health benefits of eating a high-fiber diet, such as preventing constipation, lowering blood cholesterol, helping with weight loss, and reducing your risk of heart disease, diabetes, and certain cancers.  Gradually increase your intake of fiber. Increasing too fast can result in cramping, bloating, and gas. Drink plenty of water while you increase your fiber.  The best sources of fiber include whole fruits and vegetables, whole grains, nuts, seeds, and beans. This information is not intended to replace advice given to you by your health care provider. Make sure you discuss any questions you have with your health care provider. Document Released: 06/17/2005 Document Revised: 04/21/2017 Document Reviewed: 04/21/2017 Elsevier Interactive Patient Education  2019 [employer].

## 2018-08-10 ENCOUNTER — Ambulatory Visit: Payer: Self-pay | Admitting: Gastroenterology

## 2018-10-28 ENCOUNTER — Ambulatory Visit: Payer: Self-pay | Admitting: Gastroenterology

## 2019-03-20 ENCOUNTER — Other Ambulatory Visit: Payer: Self-pay

## 2019-03-20 DIAGNOSIS — M79604 Pain in right leg: Secondary | ICD-10-CM | POA: Insufficient documentation

## 2019-03-20 DIAGNOSIS — E876 Hypokalemia: Secondary | ICD-10-CM | POA: Insufficient documentation

## 2019-03-20 DIAGNOSIS — E1165 Type 2 diabetes mellitus with hyperglycemia: Secondary | ICD-10-CM | POA: Insufficient documentation

## 2019-03-20 DIAGNOSIS — Z7984 Long term (current) use of oral hypoglycemic drugs: Secondary | ICD-10-CM | POA: Insufficient documentation

## 2019-03-20 NOTE — ED Triage Notes (Signed)
Patient reports right leg pain from hip downward.  Patient denies any type of injury.

## 2019-03-21 ENCOUNTER — Emergency Department: Payer: Self-pay

## 2019-03-21 ENCOUNTER — Emergency Department
Admission: EM | Admit: 2019-03-21 | Discharge: 2019-03-21 | Disposition: A | Discharge status: Home / Self Care | Payer: Self-pay | Attending: Emergency Medicine | Admitting: Emergency Medicine

## 2019-03-21 DIAGNOSIS — R739 Hyperglycemia, unspecified: Secondary | ICD-10-CM

## 2019-03-21 DIAGNOSIS — E876 Hypokalemia: Secondary | ICD-10-CM

## 2019-03-21 DIAGNOSIS — M79604 Pain in right leg: Secondary | ICD-10-CM

## 2019-03-21 LAB — BASIC METABOLIC PANEL
Anion gap: 10 (ref 5–15)
BUN: 7 mg/dL (ref 6–20)
CO2: 26 mmol/L (ref 22–32)
Calcium: 8.6 mg/dL — ABNORMAL LOW (ref 8.9–10.3)
Chloride: 99 mmol/L (ref 98–111)
Creatinine, Ser: 0.55 mg/dL (ref 0.44–1.00)
GFR calc Af Amer: >60 mL/min (ref >60)
GFR calc non Af Amer: >60 mL/min (ref >60)
Glucose, Bld: 390 mg/dL — ABNORMAL HIGH (ref 70–99)
Potassium: 3.2 mmol/L — ABNORMAL LOW (ref 3.5–5.1)
Sodium: 135 mmol/L (ref 135–145)

## 2019-03-21 LAB — CK: Total CK: 49 U/L (ref 38–234)

## 2019-03-21 MED ORDER — KETOROLAC TROMETHAMINE 30 MG/ML IJ SOLN
30.0000 mg | Freq: Once | INTRAMUSCULAR | Status: AC
  Administered 2019-03-21: 01:00:00 30 mg via INTRAMUSCULAR
  Filled 2019-03-21: qty 1

## 2019-03-21 MED ORDER — POTASSIUM CHLORIDE CRYS ER 20 MEQ PO TBCR
40.0000 meq | EXTENDED_RELEASE_TABLET | Freq: Once | ORAL | Status: AC
  Administered 2019-03-21: 40 meq via ORAL
  Filled 2019-03-21: qty 2

## 2019-03-21 MED ORDER — HYDROCODONE-ACETAMINOPHEN 5-325 MG PO TABS: 1.0000 | ORAL_TABLET | Freq: Four times a day (QID) | ORAL | 0 refills | Status: DC | PRN

## 2019-03-21 MED ORDER — CYCLOBENZAPRINE HCL 5 MG PO TABS: ORAL_TABLET | ORAL | 0 refills | Status: DC

## 2019-03-21 NOTE — Discharge Instructions (Signed)
1.  Continue Ibuprofen as needed for pain.  You may take Norco and Flexeril as needed for more severe pain and muscle spasms. 2.  Return to the ER for worsening symptoms, persistent vomiting, difficulty breathing or other concerns.

## 2019-03-21 NOTE — ED Notes (Addendum)
Pt c/o longer history of R hip through leg pain; states within the last week it has gotten a lot worse; states numbness/tingling/stabbing; states it is the worst at night before bedtime. R dorsalis pedis pulse 2+. Foot warm and appropriate color. Full movement to leg.

## 2019-03-21 NOTE — ED Notes (Signed)
Attempted for IV at R ac.  

## 2019-03-21 NOTE — ED Notes (Signed)
Pt resting calmly in bed. Locked low. Rail up. Call bell within reach.

## 2019-03-21 NOTE — ED Provider Notes (Signed)
[facility] Emergency Department Provider Note   ____________________________________________   First MD Initiated Contact with Patient 03/21/19 0006     (approximate)  I have reviewed the triage vital signs and the nursing notes.   HISTORY  Chief Complaint Leg Pain    HPI Mary Velasquez is a 38 y.o. female who presents to the ED from home with a chief complaint of nontraumatic right leg pain.  Patient reports a one-week history of pain to her right hip and anterior thigh with occasional numbness/tingling-stabbing in the thigh.  Usually worse at the end of the day.  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  Denies hormone use or long travel recently.  Denies fever, cough, chest pain, shortness of breath, abdominal pain, nausea or vomiting.       Past Medical History:  Diagnosis Date  . Diabetes mellitus without complication [facility](HCC)     Patient Active Problem List   Diagnosis Date Noted  . Abdominal pain, right upper quadrant 07/29/2018  . Globus sensation 07/29/2018    Past Surgical History:  Procedure Laterality Date  . ABDOMINAL SURGERY    . BREAST SURGERY    . CESAREAN SECTION    . ESOPHAGOGASTRODUODENOSCOPY N/A 07/10/2018   Procedure: ESOPHAGOGASTRODUODENOSCOPY (EGD);  Surgeon: Toney ReilVanga, Rohini Reddy, MD;  [facility];  Service: Gastroenterology;  Laterality: N/A;  . TUBAL LIGATION      Prior to Admission medications   Medication Sig Start Date End Date Taking? Authorizing Provider  cyclobenzaprine (FLEXERIL) 5 MG tablet 1 tablet every 8 hours as he did for muscle spasms 03/21/19   Irean HongSung, Aquila Menzie J, MD  furosemide (LASIX) 20 MG tablet Take 1 tablet (20 mg total) by mouth daily. 02/15/17 02/15/18  Phineas SemenGoodman, Graydon, MD  glimepiride (AMARYL) 4 MG tablet Take 1 tablet (4 mg total) by mouth daily with breakfast for 30 days. 07/11/18 08/10/18  Abeer Iversen AmabileSakai, Isami, DO  HYDROcodone-acetaminophen (NORCO) 5-325 MG  tablet Take 1 tablet by mouth every 6 (six) hours as needed for moderate pain. 03/21/19   Irean HongSung, Sourish Allender J, MD  metFORMIN (GLUCOPHAGE) 500 MG tablet Take 1 tablet (500 mg total) by mouth 2 (two) times daily with a meal for 30 days. 07/10/18 08/09/18  Tonna BoehringerSakai, Isami, DO  ondansetron (ZOFRAN-ODT) 4 MG disintegrating tablet Take 1 tablet (4 mg total) by mouth every 6 (six) hours as needed for nausea. 07/10/18   Tonna BoehringerSakai, Isami, DO  pantoprazole (PROTONIX) 40 MG tablet Take 1 tablet (40 mg total) by mouth 2 (two) times daily for 30 days. 07/10/18 08/09/18  Palmer Shorey AmabileSakai, Isami, DO    Allergies Oxycodone and Oxycontin [oxycodone hcl]  Family History  Problem Relation Age of Onset  . CAD Neg Hx   . Seizures Neg Hx     Social History Social History   Tobacco Use  . Smoking status: Never Smoker  . Smokeless tobacco: Never Used  Substance Use Topics  . Alcohol use: No  . Drug use: Not Currently    Review of Systems  Constitutional: No fever/chills Eyes: No visual changes. ENT: No sore throat. Cardiovascular: Denies chest pain. Respiratory: Denies shortness of breath. Gastrointestinal: No abdominal pain.  No nausea, no vomiting.  No diarrhea.  No constipation. Genitourinary: Negative for dysuria. Musculoskeletal: Positive for right leg pain.  Negative for back pain. Skin: Negative for rash. Neurological: Negative for headaches, focal weakness or numbness.   ____________________________________________   PHYSICAL EXAM:  VITAL SIGNS: ED Triage Vitals [03/21/19  0000]  Enc Vitals Group     BP 130/82     Pulse Rate (!) 109     Resp 17     Temp 98.1 F (36.7 C)     Temp Source Oral     SpO2 96 %     Weight 175 lb (79.4 kg)     Height 5' (1.524 m)     Head Circumference      Peak Flow      Pain Score 10     Pain Loc      Pain Edu?      Excl. in [place]?     Constitutional: Alert and oriented. Well appearing and in no acute distress. Eyes: Conjunctivae are normal. PERRL. EOMI. Head: Atraumatic.  Nose: No congestion/rhinnorhea. Mouth/Throat: Mucous membranes are moist.  Oropharynx non-erythematous. Neck: No stridor.   Cardiovascular: Normal rate, regular rhythm. Grossly normal heart sounds.  Good peripheral circulation. Respiratory: Normal respiratory effort.  No retractions. Lungs CTAB. Gastrointestinal: Soft and nontender. No distention. No abdominal bruits. No CVA tenderness. Musculoskeletal: No spinal tenderness to palpation.  Some pain to right buttock and hamstring.  Some pain to right hip and anterior thigh.  Full range of motion hip with some pain.  2+ distal pulses.  Supple calf without tenderness.  Brisk, less than 5-second capillary refill.  No lower extremity tenderness nor edema.  No joint effusions. Neurologic:  Normal speech and language. No gross focal neurologic deficits are appreciated.  Skin:  Skin is warm, dry and intact. No rash noted. Psychiatric: Mood and affect are normal. Speech and behavior are normal.  ____________________________________________   LABS (all labs ordered are listed, but only abnormal results are displayed)  Labs Reviewed  BASIC METABOLIC PANEL - Abnormal; Notable for the following components:      Result Value   Potassium 3.2 (*)    Glucose, Bld 390 (*)    Calcium 8.6 (*)    All other components within normal limits  CK   ____________________________________________  EKG  None ____________________________________________  RADIOLOGY  ED MD interpretation: Unremarkable right hip x-ray  Official radiology report(s): Dg Hip Unilat With Pelvis 2-3 Views Right  Result Date: 03/21/2019 CLINICAL DATA:  Right hip and thigh pain EXAM: DG HIP (WITH OR WITHOUT PELVIS) 2-3V RIGHT COMPARISON:  None. FINDINGS: Clips in the pelvis. Pubic symphysis and rami appear intact. No fracture or malalignment. Joint space is maintained. IMPRESSION: Negative. Electronically Signed   By: Donavan Foil M.D.   On: 03/21/2019 00:58     ____________________________________________   PROCEDURES  Procedure(s) performed (including Critical Care):  Procedures   ____________________________________________   INITIAL IMPRESSION / ASSESSMENT AND PLAN / ED COURSE  As part of my medical decision making, I reviewed the following data within the [place] notes reviewed and incorporated, Labs reviewed, Radiograph reviewed, Notes from prior ED visits and [place] Controlled Substance Database     ELSPETH BLUCHER was evaluated in Emergency Department on 03/21/2019 for the symptoms described in the history of present illness. She was evaluated in the context of the global COVID-19 pandemic, which necessitated consideration that the patient might be at risk for infection with the SARS-CoV-2 virus that causes COVID-19. Institutional protocols and algorithms that pertain to the evaluation of patients at risk for COVID-19 are in a state of rapid change based on information released by regulatory bodies including the CDC and federal and state organizations. These policies and algorithms were followed during the patient's care  in the ED.    38 year old female who presents with a week history of nontraumatic right leg pain.  Will obtain electrolytes and CK, hip x-ray.  Administer Toradol for pain and reassess.  Clinical Course as of Mar 20 509  Wynelle Link Mar 21, 2019  0115 Updated patient of all test results.  Will have her continue NSAIDs, add muscle relaxer analgesia and follow-up with orthopedics as needed.  Strict return precautions given.  Patient verbalizes understanding agrees with plan of care.   [JS]    Clinical Course User Index [JS] Irean Hong, MD     ____________________________________________   FINAL CLINICAL IMPRESSION(S) / ED DIAGNOSES  Final diagnoses:  Right leg pain  Hypokalemia  Hyperglycemia     ED Discharge Orders         Ordered    cyclobenzaprine (FLEXERIL) 5 MG tablet     03/21/19  0123    HYDROcodone-acetaminophen (NORCO) 5-325 MG tablet  Every 6 hours PRN     03/21/19 0123           Note:  This document was prepared using Dragon voice recognition software and may include unintentional dictation errors.   Irean Hong, MD 03/21/19 [phone]

## 2019-03-21 NOTE — ED Notes (Signed)
[place] froze and wouldn't allow for printing d/c paperwork for pt to sign. Pt educated and has d/c paperwork.

## 2019-11-05 ENCOUNTER — Other Ambulatory Visit: Payer: Self-pay

## 2019-11-05 ENCOUNTER — Emergency Department
Admission: EM | Admit: 2019-11-05 | Discharge: 2019-11-05 | Disposition: A | Discharge status: Home / Self Care | Payer: Self-pay | Attending: Emergency Medicine | Admitting: Emergency Medicine

## 2019-11-05 DIAGNOSIS — Z7984 Long term (current) use of oral hypoglycemic drugs: Secondary | ICD-10-CM | POA: Insufficient documentation

## 2019-11-05 DIAGNOSIS — M5416 Radiculopathy, lumbar region: Secondary | ICD-10-CM | POA: Insufficient documentation

## 2019-11-05 DIAGNOSIS — E119 Type 2 diabetes mellitus without complications: Secondary | ICD-10-CM | POA: Insufficient documentation

## 2019-11-05 DIAGNOSIS — Z79899 Other long term (current) drug therapy: Secondary | ICD-10-CM | POA: Insufficient documentation

## 2019-11-05 MED ORDER — PREDNISONE 20 MG PO TABS
60.0000 mg | ORAL_TABLET | Freq: Once | ORAL | Status: AC
  Administered 2019-11-05: 21:00:00 60 mg via ORAL
  Filled 2019-11-05: qty 3

## 2019-11-05 MED ORDER — ONDANSETRON 4 MG PO TBDP
4.0000 mg | ORAL_TABLET | Freq: Once | ORAL | Status: AC
  Administered 2019-11-05: 21:00:00 4 mg via ORAL
  Filled 2019-11-05: qty 1

## 2019-11-05 MED ORDER — KETOROLAC TROMETHAMINE 30 MG/ML IJ SOLN
30.0000 mg | Freq: Once | INTRAMUSCULAR | Status: AC
  Administered 2019-11-05: 30 mg via INTRAMUSCULAR
  Filled 2019-11-05: qty 1

## 2019-11-05 MED ORDER — PREDNISONE 10 MG (21) PO TBPK
ORAL_TABLET | ORAL | 0 refills | Status: DC
Start: 2019-11-05 — End: 2021-09-26

## 2019-11-05 MED ORDER — HYDROCODONE-ACETAMINOPHEN 5-325 MG PO TABS
1.0000 | ORAL_TABLET | Freq: Once | ORAL | Status: AC
  Administered 2019-11-05: 21:00:00 1 via ORAL
  Filled 2019-11-05: qty 1

## 2019-11-05 NOTE — ED Notes (Signed)
See triage note. Pt stating chronic right leg pain that has gotten worse. Pt stating right shin has become numb and has had loss sensation. Upon palpating both shins pt stating unable to feel this RN touching right shin.

## 2019-11-05 NOTE — ED Provider Notes (Signed)
Emergency Department Provider Note  ____________________________________________  Time seen: Approximately 9:33 PM  I have reviewed the triage vital signs and the nursing notes.   HISTORY  Chief Complaint Leg Pain   Historian Patient     HPI Mary Velasquez is a 39 y.o. female with a history of diabetes, presents to the emergency department with sharp radiating right leg pain.  Patient states that she has skin along her anterior shin on the right which has a numb sensation.  She denies swelling or known erythema.  Patient states that she occasionally has pain along the low back.  No dysuria, hematuria or increased urinary frequency.  Patient denies possibility of pregnancy.  No other alleviating measures have been attempted.   Past Medical History:  Diagnosis Date  . Diabetes mellitus without complication (HCC)      Immunizations up to date:  Yes.     Past Medical History:  Diagnosis Date  . Diabetes mellitus without complication [facility])     Patient Active Problem List   Diagnosis Date Noted  . Abdominal pain, right upper quadrant 07/29/2018  . Globus sensation 07/29/2018    Past Surgical History:  Procedure Laterality Date  . ABDOMINAL SURGERY    . BREAST SURGERY    . CESAREAN SECTION    . ESOPHAGOGASTRODUODENOSCOPY N/A 07/10/2018   Procedure: ESOPHAGOGASTRODUODENOSCOPY (EGD);  Surgeon: Toney Reil, MD;  Location: [facility] ENDOSCOPY;  Service: Gastroenterology;  Laterality: N/A;  . TUBAL LIGATION      Prior to Admission medications   Medication Sig Start Date End Date Taking? Authorizing Provider  cyclobenzaprine (FLEXERIL) 5 MG tablet 1 tablet every 8 hours as he did for muscle spasms 03/21/19   Irean Hong, MD  furosemide (LASIX) 20 MG tablet Take 1 tablet (20 mg total) by mouth daily. 02/15/17 02/15/18  Phineas Semen, MD  glimepiride (AMARYL) 4 MG tablet Take 1 tablet (4 mg total) by mouth daily with breakfast for 30 days. 07/11/18 08/10/18  Sung Amabile, DO  HYDROcodone-acetaminophen (NORCO) 5-325 MG tablet Take 1 tablet by mouth every 6 (six) hours as needed for moderate pain. 03/21/19   Irean Hong, MD  metFORMIN (GLUCOPHAGE) 500 MG tablet Take 1 tablet (500 mg total) by mouth 2 (two) times daily with a meal for 30 days. 07/10/18 08/09/18  Tonna Boehringer, Isami, DO  ondansetron (ZOFRAN-ODT) 4 MG disintegrating tablet Take 1 tablet (4 mg total) by mouth every 6 (six) hours as needed for nausea. 07/10/18   Tonna Boehringer, Isami, DO  pantoprazole (PROTONIX) 40 MG tablet Take 1 tablet (40 mg total) by mouth 2 (two) times daily for 30 days. 07/10/18 08/09/18  Tonna Boehringer, Isami, DO  predniSONE (STERAPRED UNI-PAK 21 TAB) 10 MG (21) TBPK tablet Take 6 tablets the first 2 days.  Take 1 less tablet every 2 days until medication runs out. 11/05/19   Orvil Feil, PA-C    Allergies Oxycodone and Oxycontin [oxycodone hcl]  Family History  Problem Relation Age of Onset  . CAD Neg Hx   . Seizures Neg Hx     Social History Social History   Tobacco Use  . Smoking status: Never Smoker  . Smokeless tobacco: Never Used  Substance Use Topics  . Alcohol use: No  . Drug use: Not Currently     Review of Systems  Constitutional: No fever/chills Eyes:  No discharge ENT: No upper respiratory complaints. Respiratory: no cough. No SOB/ use of accessory muscles to breath Gastrointestinal:   No nausea, no vomiting.  No diarrhea.  No constipation. Musculoskeletal: Patient has right leg pain.  Skin: Negative for rash, abrasions, lacerations, ecchymosis.    ____________________________________________   PHYSICAL EXAM:  VITAL SIGNS: ED Triage Vitals  Enc Vitals Group     BP 11/05/19 1947 130/82     Pulse Rate 11/05/19 1947 (!) 111     Resp 11/05/19 1947 16     Temp 11/05/19 1947 97.7 F (36.5 C)     Temp Source 11/05/19 1947 Oral     SpO2 11/05/19 1947 98 %     Weight 11/05/19 1946 170 lb (77.1 kg)     Height 11/05/19 1946 5' (1.524 m)     Head Circumference --       Peak Flow --      Pain Score 11/05/19 1946 9     Pain Loc --      Pain Edu? --      Excl. in GC? --      Constitutional: Alert and oriented. Well appearing and in no acute distress. Eyes: Conjunctivae are normal. PERRL. EOMI. Head: Atraumatic. Cardiovascular: Normal rate, regular rhythm. Normal S1 and S2.  Good peripheral circulation. Respiratory: Normal respiratory effort without tachypnea or retractions. Lungs CTAB. Good air entry to the bases with no decreased or absent breath sounds Gastrointestinal: Bowel sounds x 4 quadrants. Soft and nontender to palpation. No guarding or rigidity. No distention. Musculoskeletal: Full range of motion to all extremities. No obvious deformities noted. Patient has a positive straight leg raise test on the right.  Neurologic:  Normal for age. No gross focal neurologic deficits are appreciated.  Skin:  Skin is warm, dry and intact. No rash noted. Psychiatric: Mood and affect are normal for age. Speech and behavior are normal.   ____________________________________________   LABS (all labs ordered are listed, but only abnormal results are displayed)  Labs Reviewed - No data to display ____________________________________________  EKG   ____________________________________________  RADIOLOGY   No results found.  ____________________________________________    PROCEDURES  Procedure(s) performed:     Procedures     Medications  ketorolac (TORADOL) 30 MG/ML injection 30 mg (30 mg Intramuscular Given 11/05/19 2115)  predniSONE (DELTASONE) tablet 60 mg (60 mg Oral Given 11/05/19 2115)  HYDROcodone-acetaminophen (NORCO/VICODIN) 5-325 MG per tablet 1 tablet (1 tablet Oral Given 11/05/19 2115)  ondansetron (ZOFRAN-ODT) disintegrating tablet 4 mg (4 mg Oral Given 11/05/19 2115)     ____________________________________________   INITIAL IMPRESSION / ASSESSMENT AND PLAN / ED COURSE  Pertinent labs & imaging results that were  available during my care of the patient were reviewed by me and considered in my medical decision making (see chart for details).      Assessment and Plan:  Leg pain:  39 year old female presents to the emergency department with right lower extremity pain.  Vital signs are reassuring at triage.  On physical exam, patient had a positive straight leg raise test. Patient reported some paresthesias over the L5 dermatome but sensation was intact.  Patient was given Toradol, Norco and prednisone in the emergency department.  She was discharged with a prednisone taper and a work note was provided.  Patient was cautioned that if her symptoms persist, she should follow-up with neurosurgeon, Dr. Marcell Barlow.  Return precautions were given to return to the emergency department with bowel or bladder incontinence or saddle anesthesia.  She voiced understanding. ____________________________________________  FINAL CLINICAL IMPRESSION(S) / ED DIAGNOSES  Final diagnoses:  Lumbar radiculopathy      NEW MEDICATIONS  STARTED DURING THIS VISIT:  ED Discharge Orders         Ordered    predniSONE (STERAPRED UNI-PAK 21 TAB) 10 MG (21) TBPK tablet     11/05/19 2204              This chart was dictated using voice recognition software/Dragon. Despite best efforts to proofread, errors can occur which can change the meaning. Any change was purely unintentional.     Karren Cobble 11/05/19 2209    Carrie Mew, MD 11/05/19 [phone]

## 2019-11-05 NOTE — ED Notes (Signed)
Pt reports she still "feels about the same."

## 2019-11-05 NOTE — Discharge Instructions (Signed)
Take prednisone taper as prescribed. Please follow-up with Dr. Marcell Barlow if symptoms persist. If you experience bowel or bladder incontinence or numbness/tingling around the groin, please return to the emergency department.

## 2019-11-05 NOTE — ED Triage Notes (Signed)
Patient reports right leg pain off/on "for awhile" but now is constant.  Denies any known injury.

## 2019-11-18 ENCOUNTER — Other Ambulatory Visit: Payer: Self-pay | Admitting: Nurse Practitioner

## 2019-11-18 ENCOUNTER — Ambulatory Visit
Admission: RE | Admit: 2019-11-18 | Discharge: 2019-11-18 | Disposition: A | Discharge status: Home / Self Care | Payer: 59 | Source: Ambulatory Visit | Attending: Nurse Practitioner | Admitting: Nurse Practitioner

## 2019-11-18 ENCOUNTER — Other Ambulatory Visit: Payer: Self-pay

## 2019-11-18 ENCOUNTER — Other Ambulatory Visit (HOSPITAL_COMMUNITY): Payer: Self-pay | Admitting: Nurse Practitioner

## 2019-11-18 DIAGNOSIS — G834 Cauda equina syndrome: Secondary | ICD-10-CM

## 2020-01-14 ENCOUNTER — Other Ambulatory Visit: Payer: Self-pay | Admitting: Neurology

## 2020-01-14 DIAGNOSIS — R202 Paresthesia of skin: Secondary | ICD-10-CM

## 2020-02-09 ENCOUNTER — Ambulatory Visit
Admission: RE | Admit: 2020-02-09 | Discharge: 2020-02-09 | Disposition: A | Discharge status: Home / Self Care | Payer: 59 | Source: Ambulatory Visit | Attending: Neurology | Admitting: Neurology

## 2020-02-09 ENCOUNTER — Other Ambulatory Visit: Payer: Self-pay

## 2020-02-09 DIAGNOSIS — R202 Paresthesia of skin: Secondary | ICD-10-CM

## 2020-02-09 MED ORDER — GADOBENATE DIMEGLUMINE 529 MG/ML IV SOLN
16.0000 mL | Freq: Once | INTRAVENOUS | Status: AC | PRN
  Administered 2020-02-09: 16 mL via INTRAVENOUS

## 2020-04-20 ENCOUNTER — Other Ambulatory Visit: Payer: Self-pay | Admitting: Neurology

## 2020-04-20 DIAGNOSIS — R202 Paresthesia of skin: Secondary | ICD-10-CM

## 2020-04-20 DIAGNOSIS — N39498 Other specified urinary incontinence: Secondary | ICD-10-CM

## 2020-05-14 ENCOUNTER — Other Ambulatory Visit: Payer: Self-pay

## 2020-05-14 ENCOUNTER — Ambulatory Visit
Admission: RE | Admit: 2020-05-14 | Discharge: 2020-05-14 | Disposition: A | Discharge status: Home / Self Care | Payer: 59 | Source: Ambulatory Visit | Attending: Neurology | Admitting: Neurology

## 2020-05-14 DIAGNOSIS — R202 Paresthesia of skin: Secondary | ICD-10-CM | POA: Diagnosis present

## 2020-05-14 DIAGNOSIS — N39498 Other specified urinary incontinence: Secondary | ICD-10-CM | POA: Diagnosis present

## 2020-05-14 MED ORDER — GADOBUTROL 1 MMOL/ML IV SOLN
7.5000 mL | Freq: Once | INTRAVENOUS | Status: AC | PRN
  Administered 2020-05-14: 7.5 mL via INTRAVENOUS

## 2020-05-30 ENCOUNTER — Ambulatory Visit: Payer: 59

## 2020-06-01 ENCOUNTER — Ambulatory Visit: Payer: 59 | Attending: Neurology

## 2020-06-05 ENCOUNTER — Ambulatory Visit: Payer: 59

## 2020-06-07 ENCOUNTER — Ambulatory Visit: Payer: 59

## 2020-06-13 ENCOUNTER — Ambulatory Visit: Payer: 59

## 2020-06-15 ENCOUNTER — Ambulatory Visit: Payer: 59

## 2020-06-20 ENCOUNTER — Ambulatory Visit: Payer: 59

## 2020-06-22 ENCOUNTER — Ambulatory Visit: Payer: 59

## 2020-06-27 ENCOUNTER — Ambulatory Visit: Payer: 59

## 2020-06-29 ENCOUNTER — Ambulatory Visit: Payer: 59

## 2020-07-04 ENCOUNTER — Ambulatory Visit: Payer: 59

## 2020-07-11 ENCOUNTER — Ambulatory Visit: Payer: 59

## 2020-07-13 ENCOUNTER — Ambulatory Visit: Payer: 59

## 2020-07-18 ENCOUNTER — Ambulatory Visit: Payer: 59

## 2020-07-20 ENCOUNTER — Ambulatory Visit: Payer: 59

## 2020-07-25 ENCOUNTER — Ambulatory Visit: Payer: 59

## 2020-07-26 ENCOUNTER — Other Ambulatory Visit: Payer: Self-pay

## 2020-07-26 ENCOUNTER — Emergency Department
Admission: EM | Admit: 2020-07-26 | Discharge: 2020-07-26 | Disposition: A | Discharge status: Home / Self Care | Payer: 59 | Attending: Emergency Medicine | Admitting: Emergency Medicine

## 2020-07-26 ENCOUNTER — Encounter: Payer: Self-pay | Admitting: Emergency Medicine

## 2020-07-26 DIAGNOSIS — E1142 Type 2 diabetes mellitus with diabetic polyneuropathy: Secondary | ICD-10-CM

## 2020-07-26 DIAGNOSIS — M792 Neuralgia and neuritis, unspecified: Secondary | ICD-10-CM | POA: Diagnosis present

## 2020-07-26 DIAGNOSIS — Z7984 Long term (current) use of oral hypoglycemic drugs: Secondary | ICD-10-CM | POA: Insufficient documentation

## 2020-07-26 HISTORY — DX: Arteriovenous malformation, site unspecified: Q27.30

## 2020-07-26 HISTORY — DX: Polyneuropathy, unspecified: G62.9

## 2020-07-26 MED ORDER — GABAPENTIN 300 MG PO CAPS
600.0000 mg | ORAL_CAPSULE | Freq: Once | ORAL | Status: AC
  Administered 2020-07-26: 600 mg via ORAL
  Filled 2020-07-26: qty 2

## 2020-07-26 MED ORDER — ACETAMINOPHEN 500 MG PO TABS
1000.0000 mg | ORAL_TABLET | Freq: Once | ORAL | Status: AC
  Administered 2020-07-26: 1000 mg via ORAL
  Filled 2020-07-26: qty 2

## 2020-07-26 MED ORDER — IBUPROFEN 600 MG PO TABS
600.0000 mg | ORAL_TABLET | Freq: Once | ORAL | Status: AC
  Administered 2020-07-26: 600 mg via ORAL
  Filled 2020-07-26: qty 1

## 2020-07-26 MED ORDER — METHOCARBAMOL 500 MG PO TABS
500.0000 mg | ORAL_TABLET | Freq: Once | ORAL | Status: AC
  Administered 2020-07-26: 500 mg via ORAL
  Filled 2020-07-26: qty 1

## 2020-07-26 NOTE — ED Provider Notes (Signed)
[facility] Emergency Department Provider Note ____________________________________________   Event Date/Time   First MD Initiated Contact with Patient 07/26/20 [phone]     (approximate)  I have reviewed the triage vital signs and the nursing notes.  HISTORY  Chief Complaint Peripheral Neuropathy   HPI Mary Velasquez is a 40 y.o. femalewho presents to the ED for evaluation of diffuse pain.   Chart review indicates hx DM, chronic pain syndrome, GERD and obesity.  Neurology visit from 11/22.  Patient is on Neurontin and nortriptyline.  Patient presents to the ED with "a few days" of acute on chronic neuropathic pain to her bilateral lower extremities.  Patient reports doubling up on her home gabapentin despite this has had continued pain.  She denies any trauma, falls, syncope, fever, local swelling or rashes.  Currently reporting 8/10 intensity bilateral shooting leg pain.   Past Medical History:  Diagnosis Date  . AVM (arteriovenous malformation)   . Diabetes mellitus without complication (HCC)   . Peripheral neuropathy     Patient Active Problem List   Diagnosis Date Noted  . Abdominal pain, right upper quadrant 07/29/2018  . Globus sensation 07/29/2018    Past Surgical History:  Procedure Laterality Date  . ABDOMINAL SURGERY    . BREAST SURGERY    . CESAREAN SECTION    . ESOPHAGOGASTRODUODENOSCOPY N/A 07/10/2018   Procedure: ESOPHAGOGASTRODUODENOSCOPY (EGD);  Surgeon: Toney Reil, MD;  Location: [facility] ENDOSCOPY;  Service: Gastroenterology;  Laterality: N/A;  . TUBAL LIGATION      Prior to Admission medications   Medication Sig Start Date End Date Taking? Authorizing Provider  cyclobenzaprine (FLEXERIL) 5 MG tablet 1 tablet every 8 hours as he did for muscle spasms 03/21/19   Irean Hong, MD  furosemide (LASIX) 20 MG tablet Take 1 tablet (20 mg total) by mouth daily. 02/15/17 02/15/18  Phineas Semen, MD  glimepiride (AMARYL) 4 MG tablet  Take 1 tablet (4 mg total) by mouth daily with breakfast for 30 days. 07/11/18 08/10/18  Sung Amabile, DO  HYDROcodone-acetaminophen (NORCO) 5-325 MG tablet Take 1 tablet by mouth every 6 (six) hours as needed for moderate pain. 03/21/19   Irean Hong, MD  metFORMIN (GLUCOPHAGE) 500 MG tablet Take 1 tablet (500 mg total) by mouth 2 (two) times daily with a meal for 30 days. 07/10/18 08/09/18  Tonna Boehringer, Isami, DO  ondansetron (ZOFRAN-ODT) 4 MG disintegrating tablet Take 1 tablet (4 mg total) by mouth every 6 (six) hours as needed for nausea. 07/10/18   Tonna Boehringer, Isami, DO  pantoprazole (PROTONIX) 40 MG tablet Take 1 tablet (40 mg total) by mouth 2 (two) times daily for 30 days. 07/10/18 08/09/18  Tonna Boehringer, Isami, DO  predniSONE (STERAPRED UNI-PAK 21 TAB) 10 MG (21) TBPK tablet Take 6 tablets the first 2 days.  Take 1 less tablet every 2 days until medication runs out. 11/05/19   Orvil Feil, PA-C    Allergies Oxycodone and Oxycontin [oxycodone hcl]  Family History  Problem Relation Age of Onset  . CAD Neg Hx   . Seizures Neg Hx     Social History Social History   Tobacco Use  . Smoking status: Never Smoker  . Smokeless tobacco: Never Used  Vaping Use  . Vaping Use: Never used  Substance Use Topics  . Alcohol use: No  . Drug use: Not Currently    Review of Systems  Constitutional: No fever/chills Eyes: No visual changes. ENT: No sore throat. Cardiovascular: Denies chest  pain. Respiratory: Denies shortness of breath. Gastrointestinal: No abdominal pain.  No nausea, no vomiting.  No diarrhea.  No constipation. Genitourinary: Negative for dysuria. Musculoskeletal: Negative for back pain.  Positive for bilateral shooting leg pain. Skin: Negative for rash. Neurological: Negative for headaches, focal weakness or numbness.   ____________________________________________   PHYSICAL EXAM:  VITAL SIGNS: Vitals:   07/26/20 0130 07/26/20 0517  BP: (!) 121/94 122/89  Pulse: (!) 110 (!) 110   Resp: 16 16  Temp: 98.2 F (36.8 C)   SpO2: 99% 99%     Constitutional: Alert and oriented. Well appearing and in no acute distress. Eyes: Conjunctivae are normal. PERRL. EOMI. Head: Atraumatic. Nose: No congestion/rhinnorhea. Mouth/Throat: Mucous membranes are moist.  Oropharynx non-erythematous. Neck: No stridor. No cervical spine tenderness to palpation. Cardiovascular: Normal rate, regular rhythm. Grossly normal heart sounds.  Good peripheral circulation. Respiratory: Normal respiratory effort.  No retractions. Lungs CTAB. Gastrointestinal: Soft , nondistended, nontender to palpation. No CVA tenderness. Musculoskeletal: No lower extremity tenderness nor edema.  No joint effusions. No signs of acute trauma. Neurologic:  Normal speech and language. No gross focal neurologic deficits are appreciated. No gait instability noted. Cranial nerves II through XII intact 5/5 strength and sensation in all 4 extremities Skin:  Skin is warm, dry and intact. No rash noted. Psychiatric: Mood and affect are normal. Speech and behavior are normal.  ____________________________________________   PROCEDURES and INTERVENTIONS  Procedure(s) performed (including Critical Care):  .1-3 Lead EKG Interpretation Performed by: Delton Prairie, MD Authorized by: Delton Prairie, MD     Interpretation: abnormal     ECG rate:  106   ECG rate assessment: tachycardic     Rhythm: sinus tachycardia     Ectopy: none     Conduction: normal      Medications  acetaminophen (TYLENOL) tablet 1,000 mg (1,000 mg Oral Given 07/26/20 0341)  gabapentin (NEURONTIN) capsule 600 mg (600 mg Oral Given 07/26/20 0341)  methocarbamol (ROBAXIN) tablet 500 mg (500 mg Oral Given 07/26/20 0341)  ibuprofen (ADVIL) tablet 600 mg (600 mg Oral Given 07/26/20 0341)    ____________________________________________   MDM / ED COURSE   40 year old woman with known diabetes and associated peripheral neuropathy presents to the ED with  acute on chronic pain, ultimately amenable to outpatient management.  Tachycardic on arrival, likely due to distress, otherwise hemodynamically stable.  Exam without evidence of acute pathology.  She looks well without signs of trauma, neurovascular deficits.  No evidence of DVT, rash.  Patient with improvement of her symptoms with the provision of nonnarcotic multimodal pain control.  No evidence of acute pathology to require blood work or additional ED management at this time.  We will have the patient follow-up closely with her neurologist and PCP.  Return precautions for the ED were discussed.      ____________________________________________   FINAL CLINICAL IMPRESSION(S) / ED DIAGNOSES  Final diagnoses:  Diabetic polyneuropathy associated with type 2 diabetes mellitus [facility])     ED Discharge Orders    None       Mary Velasquez   Note:  This document was prepared using Dragon voice recognition software and may include unintentional dictation errors.   Delton Prairie, MD 07/26/20 [phone]

## 2020-07-26 NOTE — Discharge Instructions (Addendum)
Please take Tylenol and ibuprofen/Advil for your pain.  It is safe to take them together, or to alternate them every few hours.  Take up to 1000mg of Tylenol at a time, up to 4 times per day.  Do not take more than 4000 mg of Tylenol in 24 hours.  For ibuprofen, take 400-600 mg, 4-5 times per day. ° ° °

## 2020-07-26 NOTE — ED Triage Notes (Signed)
Pt to ED from home c/o burning sharp pain throughout body.  Hx of peripheral neuropathy and feels similar but worst case.  Pt states unable to wait for appointment d/t pain.  States legs on fire in triage is the worst.  Sees Dr. Welton Flakes and Dr. Clelia Croft, took extra gabapentin at home without relief.  Pt A&Ox4, chest rise even and unlabored, skin WNL, in NAD at this time.

## 2020-07-27 ENCOUNTER — Ambulatory Visit: Payer: 59

## 2020-09-26 DIAGNOSIS — M899 Disorder of bone, unspecified: Secondary | ICD-10-CM | POA: Insufficient documentation

## 2020-09-26 DIAGNOSIS — Z789 Other specified health status: Secondary | ICD-10-CM | POA: Insufficient documentation

## 2020-09-26 DIAGNOSIS — G894 Chronic pain syndrome: Secondary | ICD-10-CM | POA: Insufficient documentation

## 2020-09-26 DIAGNOSIS — Z79899 Other long term (current) drug therapy: Secondary | ICD-10-CM | POA: Insufficient documentation

## 2020-09-26 NOTE — Progress Notes (Deleted)
No show to appointment.

## 2020-09-27 ENCOUNTER — Ambulatory Visit: Payer: 59 | Admitting: Pain Medicine

## 2020-09-27 ENCOUNTER — Encounter: Payer: Self-pay | Admitting: Pain Medicine

## 2020-09-27 DIAGNOSIS — R209 Unspecified disturbances of skin sensation: Secondary | ICD-10-CM | POA: Insufficient documentation

## 2020-09-27 DIAGNOSIS — M47816 Spondylosis without myelopathy or radiculopathy, lumbar region: Secondary | ICD-10-CM | POA: Insufficient documentation

## 2020-09-27 DIAGNOSIS — M4802 Spinal stenosis, cervical region: Secondary | ICD-10-CM | POA: Insufficient documentation

## 2020-09-27 DIAGNOSIS — G894 Chronic pain syndrome: Secondary | ICD-10-CM

## 2020-09-27 DIAGNOSIS — M25551 Pain in right hip: Secondary | ICD-10-CM | POA: Insufficient documentation

## 2020-09-27 DIAGNOSIS — R200 Anesthesia of skin: Secondary | ICD-10-CM | POA: Insufficient documentation

## 2020-09-27 DIAGNOSIS — M5020 Other cervical disc displacement, unspecified cervical region: Secondary | ICD-10-CM | POA: Insufficient documentation

## 2020-09-27 DIAGNOSIS — M899 Disorder of bone, unspecified: Secondary | ICD-10-CM

## 2020-09-27 DIAGNOSIS — Z79899 Other long term (current) drug therapy: Secondary | ICD-10-CM

## 2020-09-27 DIAGNOSIS — Z789 Other specified health status: Secondary | ICD-10-CM

## 2020-09-27 DIAGNOSIS — R202 Paresthesia of skin: Secondary | ICD-10-CM | POA: Insufficient documentation

## 2020-09-27 DIAGNOSIS — M549 Dorsalgia, unspecified: Secondary | ICD-10-CM | POA: Insufficient documentation

## 2020-09-27 DIAGNOSIS — M5030 Other cervical disc degeneration, unspecified cervical region: Secondary | ICD-10-CM | POA: Insufficient documentation

## 2020-09-27 DIAGNOSIS — N39498 Other specified urinary incontinence: Secondary | ICD-10-CM | POA: Insufficient documentation

## 2020-09-27 DIAGNOSIS — M79604 Pain in right leg: Secondary | ICD-10-CM | POA: Insufficient documentation

## 2020-09-27 DIAGNOSIS — G8929 Other chronic pain: Secondary | ICD-10-CM | POA: Insufficient documentation

## 2020-11-28 ENCOUNTER — Other Ambulatory Visit: Payer: Self-pay

## 2020-11-28 ENCOUNTER — Emergency Department: Payer: 59

## 2020-11-28 ENCOUNTER — Emergency Department
Admission: EM | Admit: 2020-11-28 | Discharge: 2020-11-28 | Disposition: A | Discharge status: Home / Self Care | Payer: 59 | Attending: Emergency Medicine | Admitting: Emergency Medicine

## 2020-11-28 ENCOUNTER — Encounter: Payer: Self-pay | Admitting: Emergency Medicine

## 2020-11-28 DIAGNOSIS — R1011 Right upper quadrant pain: Secondary | ICD-10-CM | POA: Insufficient documentation

## 2020-11-28 DIAGNOSIS — Z7984 Long term (current) use of oral hypoglycemic drugs: Secondary | ICD-10-CM | POA: Diagnosis not present

## 2020-11-28 DIAGNOSIS — R112 Nausea with vomiting, unspecified: Secondary | ICD-10-CM | POA: Insufficient documentation

## 2020-11-28 DIAGNOSIS — E1140 Type 2 diabetes mellitus with diabetic neuropathy, unspecified: Secondary | ICD-10-CM | POA: Diagnosis not present

## 2020-11-28 LAB — COMPREHENSIVE METABOLIC PANEL
ALT: 11 U/L (ref 0–44)
AST: 20 U/L (ref 15–41)
Albumin: 3.3 g/dL — ABNORMAL LOW (ref 3.5–5.0)
Alkaline Phosphatase: 103 U/L (ref 38–126)
Anion gap: 13 (ref 5–15)
BUN: 11 mg/dL (ref 6–20)
CO2: 24 mmol/L (ref 22–32)
Calcium: 9 mg/dL (ref 8.9–10.3)
Chloride: 95 mmol/L — ABNORMAL LOW (ref 98–111)
Creatinine, Ser: 0.62 mg/dL (ref 0.44–1.00)
GFR, Estimated: >60 mL/min (ref >60)
Glucose, Bld: 409 mg/dL — ABNORMAL HIGH (ref 70–99)
Potassium: 3.6 mmol/L (ref 3.5–5.1)
Sodium: 132 mmol/L — ABNORMAL LOW (ref 135–145)
Total Bilirubin: 0.3 mg/dL (ref 0.3–1.2)
Total Protein: 7.5 g/dL (ref 6.5–8.1)

## 2020-11-28 LAB — URINALYSIS, COMPLETE (UACMP) WITH MICROSCOPIC
Bilirubin Urine: NEGATIVE
Glucose, UA: >=500 mg/dL — AB
Hgb urine dipstick: NEGATIVE
Ketones, ur: NEGATIVE mg/dL
Leukocytes,Ua: NEGATIVE
Nitrite: NEGATIVE
Protein, ur: NEGATIVE mg/dL
Specific Gravity, Urine: 1.035 — ABNORMAL HIGH (ref 1.005–1.030)
pH: 5 (ref 5.0–8.0)

## 2020-11-28 LAB — CBC
HCT: 39.3 % (ref 36.0–46.0)
Hemoglobin: 12.7 g/dL (ref 12.0–15.0)
MCH: 26.4 pg (ref 26.0–34.0)
MCHC: 32.3 g/dL (ref 30.0–36.0)
MCV: 81.7 fL (ref 80.0–100.0)
Platelets: 275 10*3/uL (ref 150–400)
RBC: 4.81 MIL/uL (ref 3.87–5.11)
RDW: 13.3 % (ref 11.5–15.5)
WBC: 7 10*3/uL (ref 4.0–10.5)
nRBC: 0 % (ref 0.0–0.2)

## 2020-11-28 LAB — LIPASE, BLOOD: Lipase: 32 U/L (ref 11–51)

## 2020-11-28 LAB — POC URINE PREG, ED: Preg Test, Ur: NEGATIVE

## 2020-11-28 MED ORDER — METOCLOPRAMIDE HCL 10 MG PO TABS: 10.0000 mg | ORAL_TABLET | Freq: Four times a day (QID) | ORAL | 0 refills | Status: DC | PRN

## 2020-11-28 MED ORDER — METOCLOPRAMIDE HCL 5 MG/ML IJ SOLN
10.0000 mg | Freq: Once | INTRAMUSCULAR | Status: AC
  Administered 2020-11-28: 10 mg via INTRAVENOUS
  Filled 2020-11-28: qty 2

## 2020-11-28 MED ORDER — ALUM & MAG HYDROXIDE-SIMETH 200-200-20 MG/5ML PO SUSP
30.0000 mL | Freq: Once | ORAL | Status: AC
  Administered 2020-11-28: 30 mL via ORAL
  Filled 2020-11-28: qty 30

## 2020-11-28 MED ORDER — FAMOTIDINE 20 MG PO TABS
40.0000 mg | ORAL_TABLET | Freq: Once | ORAL | Status: AC
  Administered 2020-11-28: 40 mg via ORAL
  Filled 2020-11-28: qty 2

## 2020-11-28 MED ORDER — SODIUM CHLORIDE 0.9 % IV BOLUS
1000.0000 mL | Freq: Once | INTRAVENOUS | Status: AC
  Administered 2020-11-28: 1000 mL via INTRAVENOUS

## 2020-11-28 MED ORDER — FAMOTIDINE 20 MG PO TABS: 20.0000 mg | ORAL_TABLET | Freq: Two times a day (BID) | ORAL | 0 refills | Status: DC

## 2020-11-28 MED ORDER — ONDANSETRON HCL 4 MG/2ML IJ SOLN
4.0000 mg | Freq: Once | INTRAMUSCULAR | Status: AC
  Administered 2020-11-28: 4 mg via INTRAVENOUS
  Filled 2020-11-28: qty 2

## 2020-11-28 MED ORDER — KETOROLAC TROMETHAMINE 30 MG/ML IJ SOLN
15.0000 mg | INTRAMUSCULAR | Status: AC
  Administered 2020-11-28: 15 mg via INTRAVENOUS
  Filled 2020-11-28: qty 1

## 2020-11-28 MED ORDER — SUCRALFATE 1 G PO TABS: 1.0000 g | ORAL_TABLET | Freq: Four times a day (QID) | ORAL | 1 refills | Status: DC

## 2020-11-28 NOTE — Discharge Instructions (Addendum)
Your lab tests, ultrasound of the gallbladder, and CT scan of the abdomen are all unremarkable.  Please take medications as prescribed to soothe your stomach and follow-up with gastroenterology in 1 week for further assessment.

## 2020-11-28 NOTE — ED Notes (Signed)
Pt in CT.

## 2020-11-28 NOTE — ED Provider Notes (Signed)
[facility] Emergency Department Provider Note  ____________________________________________  Time seen: Approximately 2:11 PM  I have reviewed the triage vital signs and the nursing notes.   HISTORY  Chief Complaint Abdominal Pain    HPI Mary Velasquez is a 40 y.o. female with a history of diabetes, peripheral neuropathy who comes ED complaining of right upper quadrant abdominal pain radiating around to the back for the past week or 2, associated with nausea, worse with eating, no alleviating factors.  Moderate intensity.  Gradually worsening over the past several days.  Waxing and waning.  No fevers or chills.  No diarrhea or constipation.      Past Medical History:  Diagnosis Date  . AVM (arteriovenous malformation)   . Diabetes mellitus without complication (HCC)   . Peripheral neuropathy      Patient Active Problem List   Diagnosis Date Noted  . Chronic hip pain (Right) 09/27/2020  . 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 Chronic lower extremity pain (Right) 09/27/2020  . Foot numbness (Right) 09/27/2020  . Loss of sensation of saddle area and right foot 09/27/2020  . Lumbar facet hypertrophy 09/27/2020  . Paresthesia of lower extremity (Bilateral) 09/27/2020  . Neurogenic urinary incontinence 09/27/2020  . Multilevel spine pain 09/27/2020  . 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 DDD (degenerative disc disease), cervical 09/27/2020  . 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 Chronic pain syndrome 09/26/2020  . Pharmacologic therapy 09/26/2020  . Disorder of skeletal system 09/26/2020  . Problems influencing health status 09/26/2020  . Abdominal pain, right upper quadrant 07/29/2018  . Globus sensation 07/29/2018     Past Surgical History:  Procedure Laterality Date  . ABDOMINAL SURGERY    . BREAST SURGERY    . CESAREAN SECTION    .  ESOPHAGOGASTRODUODENOSCOPY N/A 07/10/2018   Procedure: ESOPHAGOGASTRODUODENOSCOPY (EGD);  Surgeon: Toney Reil, MD;  Location: [facility] ENDOSCOPY;  Service: Gastroenterology;  Laterality: N/A;  . TUBAL LIGATION       Prior to Admission medications   Medication Sig Start Date End Date Taking? Authorizing Provider  cyclobenzaprine (FLEXERIL) 5 MG tablet 1 tablet every 8 hours as he did for muscle spasms 03/21/19   Irean Hong, MD  furosemide (LASIX) 20 MG tablet Take 1 tablet (20 mg total) by mouth daily. 02/15/17 02/15/18  Phineas Semen, MD  glimepiride (AMARYL) 4 MG tablet Take 1 tablet (4 mg total) by mouth daily with breakfast for 30 days. 07/11/18 08/10/18  Sung Amabile, DO  HYDROcodone-acetaminophen (NORCO) 5-325 MG tablet Take 1 tablet by mouth every 6 (six) hours as needed for moderate pain. 03/21/19   Irean Hong, MD  metFORMIN (GLUCOPHAGE) 500 MG tablet Take 1 tablet (500 mg total) by mouth 2 (two) times daily with a meal for 30 days. 07/10/18 08/09/18  Tonna Boehringer, Isami, DO  ondansetron (ZOFRAN-ODT) 4 MG disintegrating tablet Take 1 tablet (4 mg total) by mouth every 6 (six) hours as needed for nausea. 07/10/18   Tonna Boehringer, Isami, DO  pantoprazole (PROTONIX) 40 MG tablet Take 1 tablet (40 mg total) by mouth 2 (two) times daily for 30 days. 07/10/18 08/09/18  Tonna Boehringer, Isami, DO  predniSONE (STERAPRED UNI-PAK 21 TAB) 10 MG (21) TBPK tablet Take 6 tablets the first 2 days.  Take 1 less tablet every 2 days until medication runs out. 11/05/19   Orvil Feil, PA-C     Allergies Oxycodone and  Oxycontin [oxycodone hcl]   Family History  Problem Relation Age of Onset  . CAD Neg Hx   . Seizures Neg Hx     Social History Social History   Tobacco Use  . Smoking status: Never Smoker  . Smokeless tobacco: Never Used  Vaping Use  . Vaping Use: Never used  Substance Use Topics  . Alcohol use: No  . Drug use: Not Currently    Review of Systems  Constitutional:   No fever or chills.  ENT:   No  sore throat. No rhinorrhea. Cardiovascular:   No chest pain or syncope. Respiratory:   No dyspnea or cough. Gastrointestinal:   Positive as above for abdominal pain and nausea.  No vomiting constipation or diarrhea. Musculoskeletal:   Negative for focal pain or swelling All other systems reviewed and are negative except as documented above in ROS and HPI.  ____________________________________________   PHYSICAL EXAM:  VITAL SIGNS: ED Triage Vitals  Enc Vitals Group     BP 11/28/20 1021 (!) 139/107     Pulse Rate 11/28/20 1021 (!) 120     Resp 11/28/20 1021 (!) 21     Temp 11/28/20 1021 98.6 F (37 C)     Temp Source 11/28/20 1021 Oral     SpO2 11/28/20 1021 99 %     Weight 11/28/20 0959 164 lb 14.5 oz (74.8 kg)     Height 11/28/20 0959 5' (1.524 m)     Head Circumference --      Peak Flow --      Pain Score 11/28/20 0958 10     Pain Loc --      Pain Edu? --      Excl. in GC? --     Vital signs reviewed, nursing assessments reviewed.   Constitutional:   Alert and oriented. Non-toxic appearance. Eyes:   Conjunctivae are normal. EOMI. PERRL. ENT      Head:   Normocephalic and atraumatic.      Nose:   Wearing a mask.      Mouth/Throat:   Wearing a mask.      Neck:   No meningismus. Full ROM. Hematological/Lymphatic/Immunilogical:   No cervical lymphadenopathy. Cardiovascular:   RRR. Symmetric bilateral radial and DP pulses.  No murmurs. Cap refill less than 2 seconds. Respiratory:   Normal respiratory effort without tachypnea/retractions. Breath sounds are clear and equal bilaterally. No wheezes/rales/rhonchi. Gastrointestinal:   Soft with diffuse right-sided abdominal tenderness. Non distended. There is no CVA tenderness.  No rebound, rigidity, or guarding. Genitourinary:   deferred Musculoskeletal:   Normal range of motion in all extremities. No joint effusions.  No lower extremity tenderness.  No edema. Neurologic:   Normal speech and language.  Motor grossly  intact. No acute focal neurologic deficits are appreciated.  Skin:    Skin is warm, dry and intact. No rash noted.  No petechiae, purpura, or bullae.  ____________________________________________    LABS (pertinent positives/negatives) (all labs ordered are listed, but only abnormal results are displayed) Labs Reviewed  COMPREHENSIVE METABOLIC PANEL - Abnormal; Notable for the following components:      Result Value   Sodium 132 (*)    Chloride 95 (*)    Glucose, Bld 409 (*)    Albumin 3.3 (*)    All other components within normal limits  URINALYSIS, COMPLETE (UACMP) WITH MICROSCOPIC - Abnormal; Notable for the following components:   Color, Urine STRAW (*)    APPearance CLEAR (*)    Specific  Gravity, Urine 1.035 (*)    Glucose, UA >=500 (*)    Bacteria, UA RARE (*)    All other components within normal limits  LIPASE, BLOOD  CBC  POC URINE PREG, ED   ____________________________________________   EKG    ____________________________________________    RADIOLOGY  US ABDOMEN LIMITED RUQ (LIVER/GB)  Result Date: 11/28/2020 CLINICAL DATA:  Right upper quadrant pain. EXAM: ULTRASOUND ABDOMEN LIMITED RIGHT UPPER QUADRANT COMPARISON:  None. FINDINGS: Gallbladder: No gallstones or wall thickening visualized. No sonographic Murphy sign noted by sonographer. Common bile duct: Diameter: 2.3 mm Liver: Increased hepatic echogenicity consistent with fatty infiltration or hepatocellular disease. Portal vein is patent on color Doppler imaging with normal direction of blood flow towards the liver. Other: None. IMPRESSION: 1. Increased hepatic echogenicity consistent with fatty infiltration or hepatocellular disease. 2.  No gallstones or biliary distention. Electronically Signed   By: Maisie Fus  Register   On: 11/28/2020 13:05    ____________________________________________   PROCEDURES Procedures  ____________________________________________  DIFFERENTIAL DIAGNOSIS   Cholecystitis,  choledocholithiasis, pancreatitis, gastritis/GERD, appendicitis, colitis, viral illness  CLINICAL IMPRESSION / ASSESSMENT AND PLAN / ED COURSE  Medications ordered in the ED: Medications  ketorolac (TORADOL) 30 MG/ML injection 15 mg (15 mg Intravenous Given 11/28/20 1101)  ondansetron (ZOFRAN) injection 4 mg (4 mg Intravenous Given 11/28/20 1100)  sodium chloride 0.9 % bolus 1,000 mL (0 mLs Intravenous Stopped 11/28/20 1333)  metoCLOPramide (REGLAN) injection 10 mg (10 mg Intravenous Given 11/28/20 1330)  alum & mag hydroxide-simeth (MAALOX/MYLANTA) 200-200-20 MG/5ML suspension 30 mL (30 mLs Oral Given 11/28/20 1334)  famotidine (PEPCID) tablet 40 mg (40 mg Oral Given 11/28/20 1334)    Pertinent labs & imaging results that were available during my care of the patient were reviewed by me and considered in my medical decision making (see chart for details).  Mary Velasquez was evaluated in Emergency Department on 11/28/2020 for the symptoms described in the history of present illness. She was evaluated in the context of the global COVID-19 pandemic, which necessitated consideration that the patient might be at risk for infection with the SARS-CoV-2 virus that causes COVID-19. Institutional protocols and algorithms that pertain to the evaluation of patients at risk for COVID-19 are in a state of rapid change based on information released by regulatory bodies including the CDC and federal and state organizations. These policies and algorithms were followed during the patient's care in the ED.   Patient presents with right-sided abdominal pain and tenderness.  History concerning for biliary disease.  Will check labs and right upper quadrant ultrasound.  Toradol and Zofran for symptom relief.  Clinical Course as of 11/28/20 1518  Tue Nov 28, 2020  1406 Still having right-sided abdominal pain, right-sided abdominal tenderness.  Labs and ultrasound are all normal, no signs of cholecystitis or  choledocholithiasis.  Will obtain CT scan to evaluate for appendicitis or colitis. [PS]  1516 CT scan unremarkable.  Tolerating oral intake.  Tachycardia improved.  Stable for discharge [PS]    Clinical Course User Index [PS] Sharman Cheek, MD     ----------------------------------------- 2:13 PM on 11/28/2020 -----------------------------------------  Patient tolerating small amounts of water and crackers after Reglan Pepcid and Maalox.  If CT negative, recommend continuing antacid regimen and following up with GI in 1 week.  ____________________________________________   FINAL CLINICAL IMPRESSION(S) / ED DIAGNOSES    Final diagnoses:  Right upper quadrant abdominal pain  Non-intractable vomiting with nausea, unspecified vomiting type     ED Discharge Orders  None      Portions of this note were generated with dragon dictation software. Dictation errors may occur despite best attempts at proofreading.   Sharman CheekStafford, Treon Kehl, MD 11/28/20 [phone] [id number]

## 2020-11-28 NOTE — ED Notes (Signed)
Pt provided ice water, ok per Dr Stafford 

## 2020-11-28 NOTE — ED Triage Notes (Signed)
C/O RUQ abdominal pain radiating around to back x 2 weeks.  Alxo c/o nausea

## 2020-11-28 NOTE — ED Notes (Signed)
Pt to ED c/o RUQ pain radiating to back that worsens with eating. +nausea.

## 2020-11-28 NOTE — ED Notes (Signed)
Pt provided graham crackers and ice water

## 2020-12-05 DIAGNOSIS — R1011 Right upper quadrant pain: Secondary | ICD-10-CM | POA: Insufficient documentation

## 2020-12-05 DIAGNOSIS — K5904 Chronic idiopathic constipation: Secondary | ICD-10-CM | POA: Insufficient documentation

## 2020-12-05 DIAGNOSIS — K219 Gastro-esophageal reflux disease without esophagitis: Secondary | ICD-10-CM | POA: Insufficient documentation

## 2020-12-05 DIAGNOSIS — R1012 Left upper quadrant pain: Secondary | ICD-10-CM | POA: Insufficient documentation

## 2020-12-05 DIAGNOSIS — R1013 Epigastric pain: Secondary | ICD-10-CM | POA: Insufficient documentation

## 2020-12-05 NOTE — Progress Notes (Deleted)
No-show to initial evaluation for the second time.

## 2020-12-06 ENCOUNTER — Ambulatory Visit: Payer: 59 | Admitting: Pain Medicine

## 2020-12-06 ENCOUNTER — Other Ambulatory Visit: Payer: Self-pay | Admitting: Pain Medicine

## 2020-12-06 DIAGNOSIS — E1149 Type 2 diabetes mellitus with other diabetic neurological complication: Secondary | ICD-10-CM | POA: Insufficient documentation

## 2020-12-06 DIAGNOSIS — G8929 Other chronic pain: Secondary | ICD-10-CM | POA: Insufficient documentation

## 2020-12-06 DIAGNOSIS — E1140 Type 2 diabetes mellitus with diabetic neuropathy, unspecified: Secondary | ICD-10-CM | POA: Insufficient documentation

## 2020-12-06 DIAGNOSIS — R937 Abnormal findings on diagnostic imaging of other parts of musculoskeletal system: Secondary | ICD-10-CM | POA: Insufficient documentation

## 2020-12-06 DIAGNOSIS — Z91199 Patient's noncompliance with other medical treatment and regimen due to unspecified reason: Secondary | ICD-10-CM

## 2020-12-06 DIAGNOSIS — M792 Neuralgia and neuritis, unspecified: Secondary | ICD-10-CM | POA: Insufficient documentation

## 2020-12-06 NOTE — Progress Notes (Signed)
No-show to initial evaluation on 09/27/2020 and again on 12/06/2020 for the second time.  No further appointments will be offered to this patient.

## 2021-09-26 ENCOUNTER — Other Ambulatory Visit: Payer: Self-pay

## 2021-09-26 ENCOUNTER — Emergency Department: Payer: Self-pay

## 2021-09-26 ENCOUNTER — Encounter: Payer: Self-pay | Admitting: Intensive Care

## 2021-09-26 ENCOUNTER — Emergency Department
Admission: EM | Admit: 2021-09-26 | Discharge: 2021-09-27 | Disposition: A | Discharge status: Home / Self Care | Payer: Self-pay | Attending: Emergency Medicine | Admitting: Emergency Medicine

## 2021-09-26 DIAGNOSIS — R110 Nausea: Secondary | ICD-10-CM | POA: Insufficient documentation

## 2021-09-26 DIAGNOSIS — R600 Localized edema: Secondary | ICD-10-CM | POA: Insufficient documentation

## 2021-09-26 DIAGNOSIS — R1011 Right upper quadrant pain: Secondary | ICD-10-CM

## 2021-09-26 DIAGNOSIS — R7309 Other abnormal glucose: Secondary | ICD-10-CM | POA: Insufficient documentation

## 2021-09-26 DIAGNOSIS — R609 Edema, unspecified: Secondary | ICD-10-CM

## 2021-09-26 DIAGNOSIS — K5900 Constipation, unspecified: Secondary | ICD-10-CM | POA: Insufficient documentation

## 2021-09-26 LAB — URINALYSIS, ROUTINE W REFLEX MICROSCOPIC
Bilirubin Urine: NEGATIVE
Glucose, UA: >=500 mg/dL — AB
Ketones, ur: NEGATIVE mg/dL
Leukocytes,Ua: NEGATIVE
Nitrite: NEGATIVE
Protein, ur: NEGATIVE mg/dL
Specific Gravity, Urine: 1.033 — ABNORMAL HIGH (ref 1.005–1.030)
pH: 5 (ref 5.0–8.0)

## 2021-09-26 LAB — LACTIC ACID, PLASMA: Lactic Acid, Venous: 1.5 mmol/L (ref 0.5–1.9)

## 2021-09-26 LAB — COMPREHENSIVE METABOLIC PANEL
ALT: 16 U/L (ref 0–44)
AST: 21 U/L (ref 15–41)
Albumin: 3.2 g/dL — ABNORMAL LOW (ref 3.5–5.0)
Alkaline Phosphatase: 117 U/L (ref 38–126)
Anion gap: 12 (ref 5–15)
BUN: 11 mg/dL (ref 6–20)
CO2: 22 mmol/L (ref 22–32)
Calcium: 8.6 mg/dL — ABNORMAL LOW (ref 8.9–10.3)
Chloride: 95 mmol/L — ABNORMAL LOW (ref 98–111)
Creatinine, Ser: 0.84 mg/dL (ref 0.44–1.00)
GFR, Estimated: >60 mL/min (ref >60)
Glucose, Bld: 552 mg/dL (ref 70–99)
Potassium: 4 mmol/L (ref 3.5–5.1)
Sodium: 129 mmol/L — ABNORMAL LOW (ref 135–145)
Total Bilirubin: 0.6 mg/dL (ref 0.3–1.2)
Total Protein: 7.9 g/dL (ref 6.5–8.1)

## 2021-09-26 LAB — CBC
HCT: 37.4 % (ref 36.0–46.0)
Hemoglobin: 11.7 g/dL — ABNORMAL LOW (ref 12.0–15.0)
MCH: 25.8 pg — ABNORMAL LOW (ref 26.0–34.0)
MCHC: 31.3 g/dL (ref 30.0–36.0)
MCV: 82.6 fL (ref 80.0–100.0)
Platelets: 275 10*3/uL (ref 150–400)
RBC: 4.53 MIL/uL (ref 3.87–5.11)
RDW: 13.5 % (ref 11.5–15.5)
WBC: 6.2 10*3/uL (ref 4.0–10.5)
nRBC: 0 % (ref 0.0–0.2)

## 2021-09-26 LAB — BRAIN NATRIURETIC PEPTIDE: B Natriuretic Peptide: 12.1 pg/mL (ref 0.0–100.0)

## 2021-09-26 LAB — LIPASE, BLOOD: Lipase: 42 U/L (ref 11–51)

## 2021-09-26 LAB — POC URINE PREG, ED: Preg Test, Ur: NEGATIVE

## 2021-09-26 LAB — CBG MONITORING, ED: Glucose-Capillary: 291 mg/dL — ABNORMAL HIGH (ref 70–99)

## 2021-09-26 MED ORDER — IOHEXOL 300 MG/ML  SOLN
100.0000 mL | Freq: Once | INTRAMUSCULAR | Status: AC | PRN
  Administered 2021-09-26: 100 mL via INTRAVENOUS

## 2021-09-26 MED ORDER — SODIUM CHLORIDE 0.9 % IV BOLUS
1000.0000 mL | Freq: Once | INTRAVENOUS | Status: AC
  Administered 2021-09-26: 1000 mL via INTRAVENOUS

## 2021-09-26 NOTE — ED Triage Notes (Addendum)
Patient presents with bilateral lower leg and feet swelling, abdominal swelling, and c/o abdominal pain on right side. Reports 11lb weight gain in 5 days. Reports it is "harder to pee" ?

## 2021-09-27 LAB — BASIC METABOLIC PANEL
Anion gap: 4 — ABNORMAL LOW (ref 5–15)
BUN: 11 mg/dL (ref 6–20)
CO2: 27 mmol/L (ref 22–32)
Calcium: 8 mg/dL — ABNORMAL LOW (ref 8.9–10.3)
Chloride: 103 mmol/L (ref 98–111)
Creatinine, Ser: 0.58 mg/dL (ref 0.44–1.00)
GFR, Estimated: >60 mL/min (ref >60)
Glucose, Bld: 296 mg/dL — ABNORMAL HIGH (ref 70–99)
Potassium: 3.5 mmol/L (ref 3.5–5.1)
Sodium: 134 mmol/L — ABNORMAL LOW (ref 135–145)

## 2021-09-27 LAB — LACTIC ACID, PLASMA: Lactic Acid, Venous: 1.4 mmol/L (ref 0.5–1.9)

## 2021-09-27 NOTE — Discharge Instructions (Addendum)
Please follow-up with primary care at [facility].  They can help regulate your blood sugars.  Please do your fingersticks at least once if not twice a day and keep a written record of them.  Sugar today remember was up to 552.  I would go to the pharmacy and get an over-the-counter medicine to treat your constipation.  Keep your legs elevated for the next day or 2.  That should help with the swelling.  Primary care can then address any further problems.  Please do not hesitate to return for increasing pain, fever, vomiting or any other problems. ?

## 2021-09-27 NOTE — ED Notes (Signed)
MD aware of increased heart rate.   ?

## 2021-09-27 NOTE — ED Provider Notes (Signed)
? ?[facility] ?Provider Note ? ? ? Event Date/Time  ? First MD Initiated Contact with Patient 09/26/21 2056   ?  (approximate) ? ? ?History  ? ?Leg Swelling, Bloated, and Abdominal Pain ? ? ?HPI ? ?Mary Velasquez is a 40 y.o. female patient comes in with bilateral leg swelling and abdominal swelling he complains of right upper quadrant pain in her belly.  She says she has gained 11 pounds in the last 5 days and says it is harder to pee.  Her belly is and be tender in the right upper quadrant.  She is not running a fever or having vomiting or diarrhea.  She is having some nausea. ? ?  ? ? ?Physical Exam  ? ?Triage Vital Signs: ?ED Triage Vitals  ?Enc Vitals Group  ?   BP 09/26/21 1739 120/83  ?   Pulse Rate 09/26/21 1739 (!) 128  ?   Resp 09/26/21 1739 16  ?   Temp 09/26/21 1739 98.4 ?F (36.9 ?C)  ?   Temp Source 09/26/21 1739 Oral  ?   SpO2 09/26/21 1739 99 %  ?   Weight 09/26/21 1742 171 lb (77.6 kg)  ?   Height 09/26/21 1742 5' (1.524 m)  ?   Head Circumference --   ?   Peak Flow --   ?   Pain Score 09/26/21 1742 7  ?   Pain Loc --   ?   Pain Edu? --   ?   Excl. in GC? --   ? ? ?Most recent vital signs: ?Vitals:  ? 09/26/21 2330 09/27/21 0000  ?BP: 125/82 129/90  ?Pulse: (!) 109 (!) 109  ?Resp: 17 16  ?Temp:    ?SpO2: 100% 100%  ? ? ? ?General: Awake, no distress.  ?CV:  Good peripheral perfusion.  Heart regular rate and rhythm somewhat tacky ?Resp:  Normal effort.  Lungs are clear ?Abd:  Somewhat enlarged and protuberant tender in the right upper quadrant to palpation nontender elsewhere to palpation or percussion ?Extremities there is bilateral 1+ edema. ? ? ?ED Results / Procedures / Treatments  ? ?Labs ?(all labs ordered are listed, but only abnormal results are displayed) ?Labs Reviewed  ?COMPREHENSIVE METABOLIC PANEL - Abnormal; Notable for the following components:  ?    Result Value  ? Sodium 129 (*)   ? Chloride 95 (*)   ? Glucose, Bld 552 (*)   ? Calcium 8.6 (*)   ? Albumin 3.2 (*)    ? All other components within normal limits  ?CBC - Abnormal; Notable for the following components:  ? Hemoglobin 11.7 (*)   ? MCH 25.8 (*)   ? All other components within normal limits  ?URINALYSIS, ROUTINE W REFLEX MICROSCOPIC - Abnormal; Notable for the following components:  ? Color, Urine STRAW (*)   ? APPearance CLEAR (*)   ? Specific Gravity, Urine 1.033 (*)   ? Glucose, UA >=500 (*)   ? Hgb urine dipstick LARGE (*)   ? Bacteria, UA RARE (*)   ? All other components within normal limits  ?BASIC METABOLIC PANEL - Abnormal; Notable for the following components:  ? Sodium 134 (*)   ? Glucose, Bld 296 (*)   ? Calcium 8.0 (*)   ? Anion gap 4 (*)   ? All other components within normal limits  ?CBG MONITORING, ED - Abnormal; Notable for the following components:  ? Glucose-Capillary 291 (*)   ? All other components within  normal limits  ?LIPASE, BLOOD  ?LACTIC ACID, PLASMA  ?LACTIC ACID, PLASMA  ?BRAIN NATRIURETIC PEPTIDE  ?POC URINE PREG, ED  ? ? ? ?EKG ? ? ? ? ?RADIOLOGY ?CT abdomen pelvis read by radiology films reviewed by me show no acute intra-abdominal process there is a good deal of retained stool and gas in the colon.  This may explain a good part of her weight gain and distention. ? ? ?PROCEDURES: ? ?Critical Care performed:  ? ?Procedures ? ? ?MEDICATIONS ORDERED IN ED: ?Medications  ?sodium chloride 0.9 % bolus 1,000 mL (0 mLs Intravenous Stopped 09/26/21 2245)  ?iohexol (OMNIPAQUE) 300 MG/ML solution 100 mL (100 mLs Intravenous Contrast Given 09/26/21 2212)  ? ? ? ?IMPRESSION / MDM / ASSESSMENT AND PLAN / ED COURSE  ?I reviewed the triage vital signs and the nursing notes. ? ?40 feet Labs show sodium is almost normalized the glucose is much better.  She is not having any lactic acidosis urine is clear white count is normal and BNP is negative.  Liver functions are good CT does not show anything for constipation.  Likely the pain is from the constipation as is the distention or at least some of her  weight gain.  I think if we treat that we will address most of her problems.  We will have to watch her carefully to make sure her sugar stays down.  I will have her follow-up with primary care she is to go to [facility] but her primary care doctor left.  I am sure they have other primary care doctors or [facility] clinic that can take up her case.  I will ask her to return if she has any further problems. ? ?The patient is on the cardiac monitor to evaluate for evidence of arrhythmia and/or significant heart rate changes.  None were seen ? ?  ? ? ?FINAL CLINICAL IMPRESSION(S) / ED DIAGNOSES  ? ?Final diagnoses:  ?Peripheral edema  ?Right upper quadrant abdominal pain  ?Constipation, unspecified constipation type  ? ? ? ?Rx / DC Orders  ? ?ED Discharge Orders   ? ? None  ? ?  ? ? ? ?Note:  This document was prepared using Dragon voice recognition software and may include unintentional dictation errors. ?  ?Arnaldo Natal, MD ?09/27/21 0034 ? ?

## 2021-12-13 ENCOUNTER — Encounter: Payer: Self-pay | Admitting: Emergency Medicine

## 2021-12-13 ENCOUNTER — Emergency Department
Admission: EM | Admit: 2021-12-13 | Discharge: 2021-12-14 | Disposition: A | Discharge status: Home / Self Care | Payer: Self-pay | Attending: Emergency Medicine | Admitting: Emergency Medicine

## 2021-12-13 ENCOUNTER — Emergency Department: Payer: Self-pay

## 2021-12-13 DIAGNOSIS — R7309 Other abnormal glucose: Secondary | ICD-10-CM | POA: Insufficient documentation

## 2021-12-13 DIAGNOSIS — R0789 Other chest pain: Secondary | ICD-10-CM | POA: Insufficient documentation

## 2021-12-13 DIAGNOSIS — R0602 Shortness of breath: Secondary | ICD-10-CM | POA: Insufficient documentation

## 2021-12-13 DIAGNOSIS — R030 Elevated blood-pressure reading, without diagnosis of hypertension: Secondary | ICD-10-CM | POA: Insufficient documentation

## 2021-12-13 DIAGNOSIS — R000 Tachycardia, unspecified: Secondary | ICD-10-CM | POA: Insufficient documentation

## 2021-12-13 DIAGNOSIS — M7989 Other specified soft tissue disorders: Secondary | ICD-10-CM | POA: Insufficient documentation

## 2021-12-13 DIAGNOSIS — R079 Chest pain, unspecified: Secondary | ICD-10-CM

## 2021-12-13 LAB — CBC
HCT: 35.2 % — ABNORMAL LOW (ref 36.0–46.0)
Hemoglobin: 10.9 g/dL — ABNORMAL LOW (ref 12.0–15.0)
MCH: 25.1 pg — ABNORMAL LOW (ref 26.0–34.0)
MCHC: 31 g/dL (ref 30.0–36.0)
MCV: 80.9 fL (ref 80.0–100.0)
Platelets: 303 10*3/uL (ref 150–400)
RBC: 4.35 MIL/uL (ref 3.87–5.11)
RDW: 13.5 % (ref 11.5–15.5)
WBC: 6.2 10*3/uL (ref 4.0–10.5)
nRBC: 0 % (ref 0.0–0.2)

## 2021-12-13 LAB — URINALYSIS, ROUTINE W REFLEX MICROSCOPIC
Bilirubin Urine: NEGATIVE
Glucose, UA: >=500 mg/dL — AB
Ketones, ur: 5 mg/dL — AB
Nitrite: NEGATIVE
Protein, ur: NEGATIVE mg/dL
Specific Gravity, Urine: 1.029 (ref 1.005–1.030)
pH: 7 (ref 5.0–8.0)

## 2021-12-13 LAB — TROPONIN I (HIGH SENSITIVITY): Troponin I (High Sensitivity): 5 ng/L (ref <18)

## 2021-12-13 LAB — BASIC METABOLIC PANEL
Anion gap: 8 (ref 5–15)
BUN: 9 mg/dL (ref 6–20)
CO2: 26 mmol/L (ref 22–32)
Calcium: 9.3 mg/dL (ref 8.9–10.3)
Chloride: 99 mmol/L (ref 98–111)
Creatinine, Ser: 0.54 mg/dL (ref 0.44–1.00)
GFR, Estimated: >60 mL/min (ref >60)
Glucose, Bld: 413 mg/dL — ABNORMAL HIGH (ref 70–99)
Potassium: 3.6 mmol/L (ref 3.5–5.1)
Sodium: 133 mmol/L — ABNORMAL LOW (ref 135–145)

## 2021-12-13 LAB — POC URINE PREG, ED: Preg Test, Ur: NEGATIVE

## 2021-12-13 NOTE — ED Provider Notes (Signed)
[facility] Provider Note    None    (approximate)   History   Chest Pain   HPI  Mary Velasquez is a 41 y.o. female  who presents to the emergency department today because of concerns for shortness of breath and chest pain.  She states symptoms have been going on for about 3 weeks.  Came in today because she checked her blood pressure at home found was elevated and her daughters wanted her to be evaluated.  Patient states that the pressure is located in the center part of her chest.  In addition she has noticed swelling to her lower extremities.  She denies any history of heart failure but states she has family history of that. Denies any fevers.   Physical Exam   Triage Vital Signs: ED Triage Vitals  Enc Vitals Group     BP 12/13/21 2131 (!) 151/106     Pulse Rate 12/13/21 2131 (!) 114     Resp 12/13/21 2131 20     Temp 12/13/21 2131 98 F (36.7 C)     Temp Source 12/13/21 2131 Oral     SpO2 12/13/21 2131 100 %     Weight 12/13/21 2129 167 lb (75.8 kg)     Height 12/13/21 2129 5' (1.524 m)     Head Circumference --      Peak Flow --      Pain Score 12/13/21 2129 8   Most recent vital signs: Vitals:   12/13/21 2131  BP: (!) 151/106  Pulse: (!) 114  Resp: 20  Temp: 98 F (36.7 C)  SpO2: 100%   General: Awake, alert, oriented. CV:  Good peripheral perfusion. Tachycardia, regular rhythm. Resp:  Normal effort. Lungs clear. Abd:  No distention.  Other:  Mild edema to feet and ankles.   ED Results / Procedures / Treatments   Labs (all labs ordered are listed, but only abnormal results are displayed) Labs Reviewed  BASIC METABOLIC PANEL - Abnormal; Notable for the following components:      Result Value   Sodium 133 (*)    Glucose, Bld 413 (*)    All other components within normal limits  CBC - Abnormal; Notable for the following components:   Hemoglobin 10.9 (*)    HCT 35.2 (*)    MCH 25.1 (*)    All other components within normal  limits  URINALYSIS, ROUTINE W REFLEX MICROSCOPIC - Abnormal; Notable for the following components:   Color, Urine STRAW (*)    APPearance CLEAR (*)    Glucose, UA >=500 (*)    Hgb urine dipstick LARGE (*)    Ketones, ur 5 (*)    Leukocytes,Ua TRACE (*)    Bacteria, UA RARE (*)    All other components within normal limits  CBG MONITORING, ED - Abnormal; Notable for the following components:   Glucose-Capillary 370 (*)    All other components within normal limits  BRAIN NATRIURETIC PEPTIDE  D-DIMER, QUANTITATIVE (NOT AT [facility])  POC URINE PREG, ED  TROPONIN I (HIGH SENSITIVITY)  TROPONIN I (HIGH SENSITIVITY)     EKG  I, Phineas Semen, attending physician, personally viewed and interpreted this EKG  EKG Time: 2134 Rate: 117 Rhythm: sinus tachycardia Axis: normal Intervals: qtc 454 QRS: narrow, q waves v1 ST changes: no st elevation Impression: abnormal ekg   RADIOLOGY I independently interpreted and visualized the CXR. My interpretation: No pneumonia. No pneumothorax. Radiology interpretation:  IMPRESSION:  No active cardiopulmonary  disease.     PROCEDURES:  Critical Care performed: No  Procedures   MEDICATIONS ORDERED IN ED: Medications - No data to display   IMPRESSION / MDM / ASSESSMENT AND PLAN / ED COURSE  I reviewed the triage vital signs and the nursing notes.                              Differential diagnosis includes, but is not limited to, pneumonia, pneumothorax, ACS, PE.  Patient's presentation is most consistent with acute presentation with potential threat to life or bodily function.  Patient presented to the emergency department today because of concerns for shortness of breath and chest pain that has been going on for couple of weeks.  On exam patient's lungs were clear.  Heart without concerning auscultatory findings save for some tachycardia.  Chest x-ray did not show any pneumonia or pneumothorax.  Blood work with troponin negative x2.   D-dimer also negative.  At this point time I think unlikely PE.  Blood work does show elevated glucose.  I do wonder if she might be slightly dehydrated that could explain the tachycardia.  Somewhat unclear etiology of the patient's pain.  However given reassuring work-up, I do think it is reasonable for patient to be discharged.  Patient does have follow-up already scheduled with cardiology.     FINAL CLINICAL IMPRESSION(S) / ED DIAGNOSES   Final diagnoses:  Nonspecific chest pain     Note:  This document was prepared using Dragon voice recognition software and may include unintentional dictation errors.    Phineas Semen, MD 12/14/21 [phone]

## 2021-12-13 NOTE — ED Triage Notes (Signed)
Pt presents via POV with complaints of midsternal CP that started this AM. Pt endorses HTN (144/101) and dizziness this afternoon. No meds taken PTA. Denies syncope, falls, N/V.

## 2021-12-14 LAB — CBG MONITORING, ED: Glucose-Capillary: 370 mg/dL — ABNORMAL HIGH (ref 70–99)

## 2021-12-14 LAB — TROPONIN I (HIGH SENSITIVITY): Troponin I (High Sensitivity): 4 ng/L (ref <18)

## 2021-12-14 LAB — BRAIN NATRIURETIC PEPTIDE: B Natriuretic Peptide: 12.9 pg/mL (ref 0.0–100.0)

## 2021-12-14 LAB — D-DIMER, QUANTITATIVE: D-Dimer, Quant: <0.27 ug/mL-FEU (ref 0.00–0.50)

## 2021-12-14 MED ORDER — KETOROLAC TROMETHAMINE 30 MG/ML IJ SOLN
30.0000 mg | Freq: Once | INTRAMUSCULAR | Status: AC
  Administered 2021-12-14: 30 mg via INTRAVENOUS
  Filled 2021-12-14: qty 1

## 2021-12-14 MED ORDER — LIDOCAINE VISCOUS HCL 2 % MT SOLN
15.0000 mL | Freq: Once | OROMUCOSAL | Status: AC
  Administered 2021-12-14: 15 mL via ORAL
  Filled 2021-12-14: qty 15

## 2021-12-14 MED ORDER — ALUM & MAG HYDROXIDE-SIMETH 200-200-20 MG/5ML PO SUSP
30.0000 mL | Freq: Once | ORAL | Status: AC
  Administered 2021-12-14: 30 mL via ORAL
  Filled 2021-12-14: qty 30

## 2021-12-14 MED ORDER — SODIUM CHLORIDE 0.9 % IV BOLUS
1000.0000 mL | Freq: Once | INTRAVENOUS | Status: AC
  Administered 2021-12-14: 1000 mL via INTRAVENOUS

## 2021-12-14 NOTE — Discharge Instructions (Signed)
Please seek medical attention for any high fevers, chest pain, shortness of breath, change in behavior, persistent vomiting, bloody stool or any other new or concerning symptoms.  

## 2021-12-14 NOTE — ED Notes (Signed)
Pt discharge information reviewed. Pt understands need for follow up care and when to return if symptoms worsen. All questions answered. Pt is alert and oriented with even and regular respirations. Pt is seen ambulating out of department with string steady gait.   

## 2022-02-20 ENCOUNTER — Emergency Department
Admission: EM | Admit: 2022-02-20 | Discharge: 2022-02-20 | Disposition: A | Discharge status: Home / Self Care | Payer: 59 | Attending: Emergency Medicine | Admitting: Emergency Medicine

## 2022-02-20 ENCOUNTER — Other Ambulatory Visit: Payer: Self-pay

## 2022-02-20 ENCOUNTER — Encounter: Payer: Self-pay | Admitting: Emergency Medicine

## 2022-02-20 DIAGNOSIS — M7989 Other specified soft tissue disorders: Secondary | ICD-10-CM | POA: Diagnosis present

## 2022-02-20 DIAGNOSIS — E1140 Type 2 diabetes mellitus with diabetic neuropathy, unspecified: Secondary | ICD-10-CM | POA: Diagnosis not present

## 2022-02-20 DIAGNOSIS — L02416 Cutaneous abscess of left lower limb: Secondary | ICD-10-CM | POA: Diagnosis not present

## 2022-02-20 DIAGNOSIS — L0291 Cutaneous abscess, unspecified: Secondary | ICD-10-CM

## 2022-02-20 LAB — COMPREHENSIVE METABOLIC PANEL
ALT: 10 U/L (ref 0–44)
AST: 15 U/L (ref 15–41)
Albumin: 2.9 g/dL — ABNORMAL LOW (ref 3.5–5.0)
Alkaline Phosphatase: 123 U/L (ref 38–126)
Anion gap: 10 (ref 5–15)
BUN: 8 mg/dL (ref 6–20)
CO2: 25 mmol/L (ref 22–32)
Calcium: 8.8 mg/dL — ABNORMAL LOW (ref 8.9–10.3)
Chloride: 95 mmol/L — ABNORMAL LOW (ref 98–111)
Creatinine, Ser: 0.65 mg/dL (ref 0.44–1.00)
GFR, Estimated: >60 mL/min (ref >60)
Glucose, Bld: 544 mg/dL (ref 70–99)
Potassium: 3.9 mmol/L (ref 3.5–5.1)
Sodium: 130 mmol/L — ABNORMAL LOW (ref 135–145)
Total Bilirubin: 0.7 mg/dL (ref 0.3–1.2)
Total Protein: 7.9 g/dL (ref 6.5–8.1)

## 2022-02-20 LAB — CBC WITH DIFFERENTIAL/PLATELET
Abs Immature Granulocytes: 0.02 10*3/uL (ref 0.00–0.07)
Basophils Absolute: 0 10*3/uL (ref 0.0–0.1)
Basophils Relative: 0 %
Eosinophils Absolute: 0.1 10*3/uL (ref 0.0–0.5)
Eosinophils Relative: 1 %
HCT: 36.7 % (ref 36.0–46.0)
Hemoglobin: 11.4 g/dL — ABNORMAL LOW (ref 12.0–15.0)
Immature Granulocytes: 0 %
Lymphocytes Relative: 18 %
Lymphs Abs: 1.4 10*3/uL (ref 0.7–4.0)
MCH: 24.6 pg — ABNORMAL LOW (ref 26.0–34.0)
MCHC: 31.1 g/dL (ref 30.0–36.0)
MCV: 79.1 fL — ABNORMAL LOW (ref 80.0–100.0)
Monocytes Absolute: 0.6 10*3/uL (ref 0.1–1.0)
Monocytes Relative: 8 %
Neutro Abs: 5.4 10*3/uL (ref 1.7–7.7)
Neutrophils Relative %: 73 %
Platelets: 358 10*3/uL (ref 150–400)
RBC: 4.64 MIL/uL (ref 3.87–5.11)
RDW: 14 % (ref 11.5–15.5)
WBC: 7.6 10*3/uL (ref 4.0–10.5)
nRBC: 0 % (ref 0.0–0.2)

## 2022-02-20 LAB — LACTIC ACID, PLASMA: Lactic Acid, Venous: 2 mmol/L (ref 0.5–1.9)

## 2022-02-20 MED ORDER — SULFAMETHOXAZOLE-TRIMETHOPRIM 800-160 MG PO TABS: 1.0000 | ORAL_TABLET | Freq: Two times a day (BID) | ORAL | 0 refills | Status: AC

## 2022-02-20 MED ORDER — SULFAMETHOXAZOLE-TRIMETHOPRIM 800-160 MG PO TABS: 1.0000 | ORAL_TABLET | Freq: Two times a day (BID) | ORAL | 0 refills | Status: DC

## 2022-02-20 NOTE — ED Provider Notes (Signed)
[facility] Provider Note   Event Date/Time   First MD Initiated Contact with Patient 02/20/22 1640     (approximate) History  Abscess  HPI RAILYN HOUSE is a 41 y.o. female with a stated past medical history of type 2 diabetes with peripheral neuropathy who presents for a draining abscess to the back of her right knee.  Patient states that this started as a small lesion that has since become large and opened up to where it is draining purulent fluid over the last 4 days.  Patient denies any fevers however does endorse subjective chills.   ROS: Patient currently denies any vision changes, tinnitus, difficulty speaking, facial droop, sore throat, chest pain, shortness of breath, abdominal pain, nausea/vomiting/diarrhea, dysuria, or weakness/numbness/paresthesias in any extremity   Physical Exam  Triage Vital Signs: ED Triage Vitals  Enc Vitals Group     BP 02/20/22 1454 (!) 127/92     Pulse Rate 02/20/22 1454 (!) 126     Resp 02/20/22 1454 18     Temp 02/20/22 1454 (!) 97.4 F (36.3 C)     Temp src --      SpO2 02/20/22 1454 100 %     Weight 02/20/22 1457 160 lb (72.6 kg)     Height 02/20/22 1457 5' (1.524 m)     Head Circumference --      Peak Flow --      Pain Score 02/20/22 1457 6     Pain Loc --      Pain Edu? --      Excl. in [place]? --    Most recent vital signs: Vitals:   02/20/22 1454  BP: (!) 127/92  Pulse: (!) 126  Resp: 18  Temp: (!) 97.4 F (36.3 C)  SpO2: 100%   General: Awake, oriented x4. CV:  Good peripheral perfusion.  Resp:  Normal effort.  Abd:  No distention.  Other:  Overweight middle-aged African-American female sitting in chair in exam room in no acute distress.  Posterior aspect of the right knee shows a 1 cm diameter area that is open and draining purulent material with surrounding erythema and induration as well as tenderness to palpation.  There is also a small subcentimeter diameter area that is open draining just distal  to the abscess above. ED Results / Procedures / Treatments  Labs (all labs ordered are listed, but only abnormal results are displayed) Labs Reviewed  LACTIC ACID, PLASMA - Abnormal; Notable for the following components:      Result Value   Lactic Acid, Venous 2.0 (*)    All other components within normal limits  COMPREHENSIVE METABOLIC PANEL - Abnormal; Notable for the following components:   Sodium 130 (*)    Chloride 95 (*)    Glucose, Bld 544 (*)    Calcium 8.8 (*)    Albumin 2.9 (*)    All other components within normal limits  CBC WITH DIFFERENTIAL/PLATELET - Abnormal; Notable for the following components:   Hemoglobin 11.4 (*)    MCV 79.1 (*)    MCH 24.6 (*)    All other components within normal limits  LACTIC ACID, PLASMA   PROCEDURES: Critical Care performed: No Procedures MEDICATIONS ORDERED IN ED: Medications - No data to display IMPRESSION / MDM / ASSESSMENT AND PLAN / ED COURSE  I reviewed the triage vital signs and the nursing notes.  The patient is on the cardiac monitor to evaluate for evidence of arrhythmia and/or significant heart rate changes. Patient's presentation is most consistent with acute presentation with potential threat to life or bodily function. Presentation most consistent with simple Abscess. Given History, Exam, and Workup I have low suspicion for Cellulitis, Necrotizing Fasciitis, Pyomyositis, Sporotrichosis, Osteomyelitis or other emergent problem as a cause for this presentation. Interventions: none necessary as wound is already open and draining] Rx: Bactrim DS twice daily x10 days Disposition: Patient is stable for discharge, advised to follow up with primary care physician in 48 hours.   FINAL CLINICAL IMPRESSION(S) / ED DIAGNOSES   Final diagnoses:  Abscess  Cutaneous abscess of left knee   Rx / DC Orders   ED Discharge Orders          Ordered    sulfamethoxazole-trimethoprim (BACTRIM DS) 800-160 MG  tablet  2 times daily        02/20/22 1724           Note:  This document was prepared using Dragon voice recognition software and may include unintentional dictation errors.   Merwyn Katos, MD 02/20/22 [phone]

## 2022-02-20 NOTE — ED Notes (Signed)
Pt verbalized understanding of discharge instructions. Opportunity for questions provided.  

## 2022-02-20 NOTE — ED Triage Notes (Signed)
Patient arrives ambulatory by POV c/o abscess behind right knee. Patient states it started draining Sunday night. Hole to back of knee with redness around black tissue noted. Patient adds her HR has been increased and she sees her cardiologist tomorrow to find out results for echo and stress test.

## 2022-03-22 ENCOUNTER — Other Ambulatory Visit: Payer: Self-pay | Admitting: Neurology

## 2022-03-22 DIAGNOSIS — R55 Syncope and collapse: Secondary | ICD-10-CM

## 2022-03-29 ENCOUNTER — Ambulatory Visit
Admission: RE | Admit: 2022-03-29 | Discharge: 2022-03-29 | Disposition: A | Discharge status: Home / Self Care | Payer: 59 | Source: Ambulatory Visit | Attending: Neurology | Admitting: Neurology

## 2022-03-29 DIAGNOSIS — R55 Syncope and collapse: Secondary | ICD-10-CM | POA: Insufficient documentation

## 2022-03-29 MED ORDER — GADOPICLENOL 0.5 MMOL/ML IV SOLN
7.0000 mL | Freq: Once | INTRAVENOUS | Status: AC | PRN
  Administered 2022-03-29: 7 mL via INTRAVENOUS

## 2022-05-30 IMAGING — MR MR CERVICAL SPINE WO/W CM
5 of 8 series · 29 of 48 positions shown · IV contrast (gadavist)
Comparison: Head MRI 02/09/2020

CLINICAL DATA: Paresthesia of both lower extremities. Other urinary
incontinence. Spine pain and weakness for a few months.

EXAM:
MRI CERVICAL SPINE WITHOUT AND WITH CONTRAST
TECHNIQUE: Multiplanar and multiecho pulse sequences of the cervical spine, to
include the craniocervical junction and cervicothoracic junction,
were obtained without and with intravenous contrast.
CONTRAST:  7.5mL GADAVIST GADOBUTROL 1 MMOL/ML IV SOLN

[Series 5: T2 · sagittal · 3.0mm · 0.62mm/px · 4 of 15 slices shown (1 of 2)]
[im 1/15]
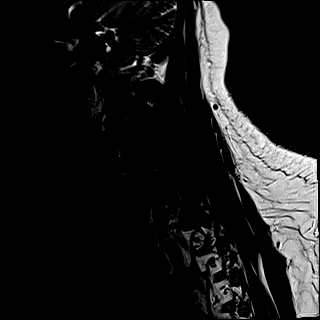
[im 5/15]
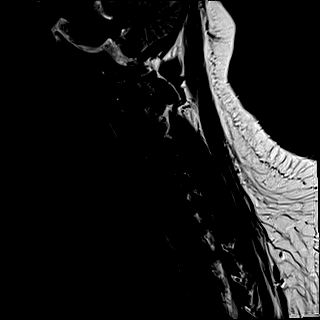
[im 10/15]
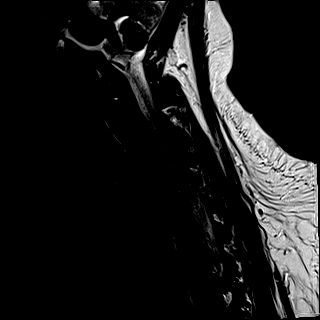
[im 15/15]
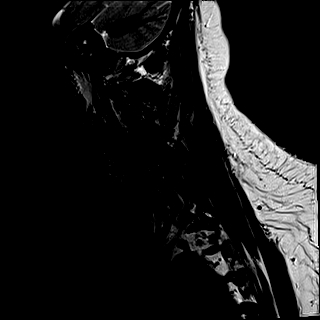

[Series 7: STIR · sagittal · 3.0mm · 0.62mm/px · 4 of 15 slices shown]
[im 1/15]
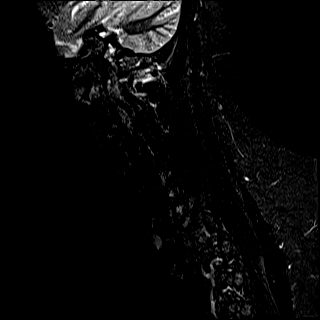
[im 5/15]
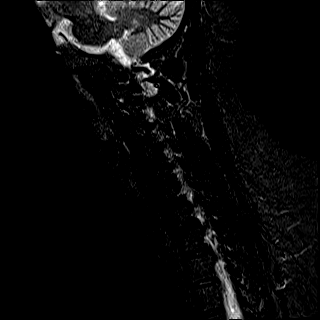
[im 10/15]
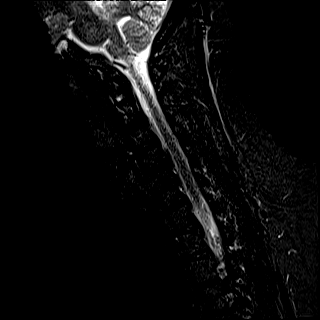
[im 15/15]
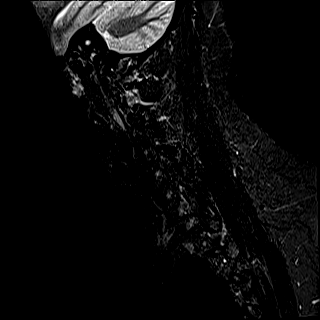

[Series 8: T2 · axial · 3.0mm · 0.70mm/px · z∈[-24,+71]mm · 8 of 29 slices shown (2 of 2)]
[im 1/29]
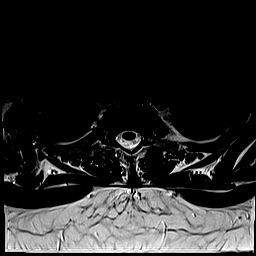
[im 5/29]
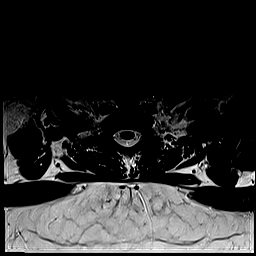
[im 9/29]
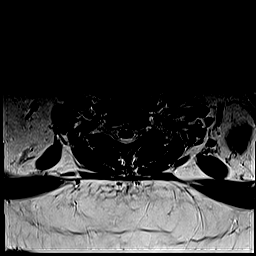
[im 13/29]
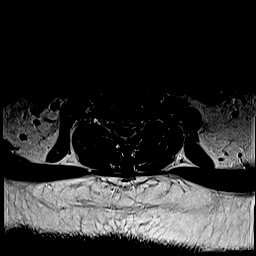
[im 17/29]
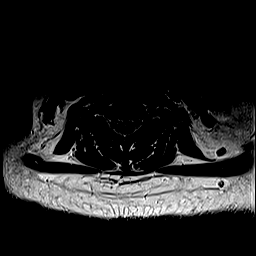
[im 21/29]
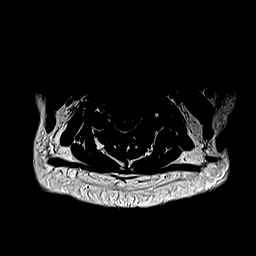
[im 25/29]
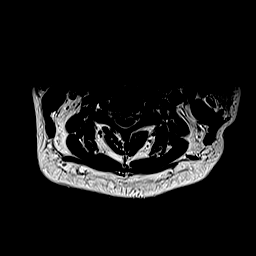
[im 29/29]
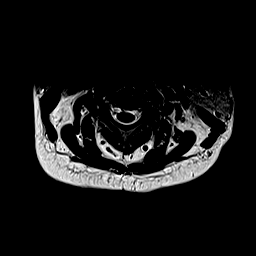

[Series 10: T1 · axial · non-contrast · 3.0mm · 0.35mm/px · z∈[-24,+71]mm · 8 of 29 slices shown]
[im 1/29]
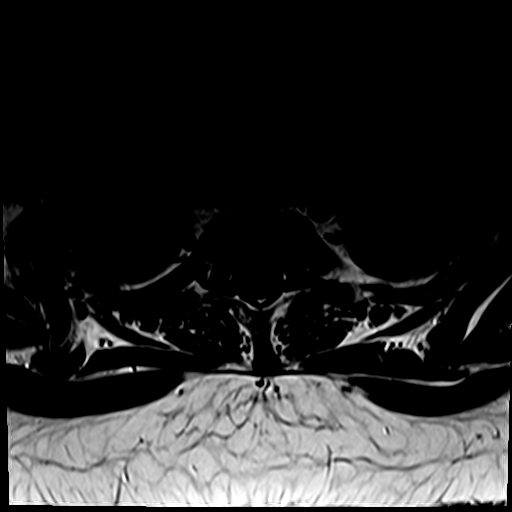
[im 5/29]
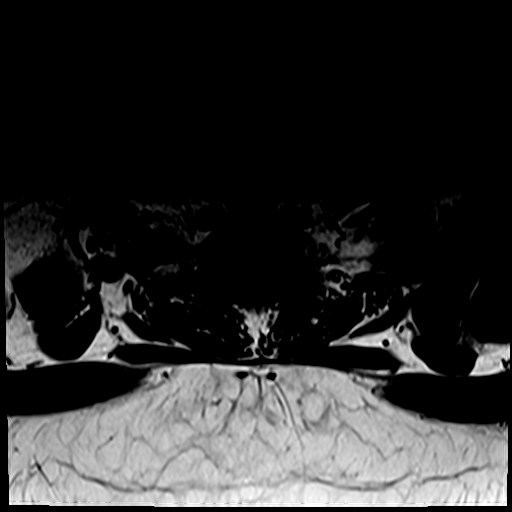
[im 9/29]
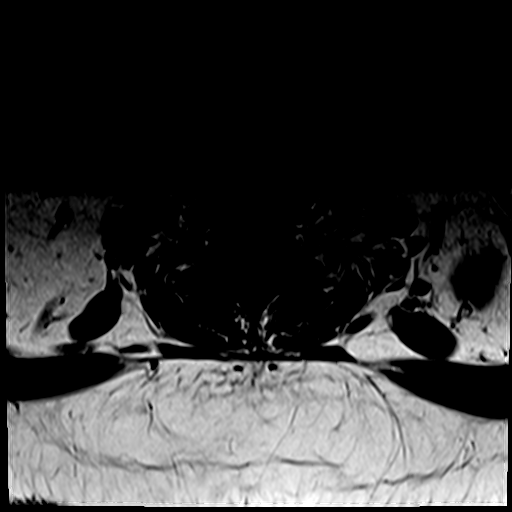
[im 13/29]
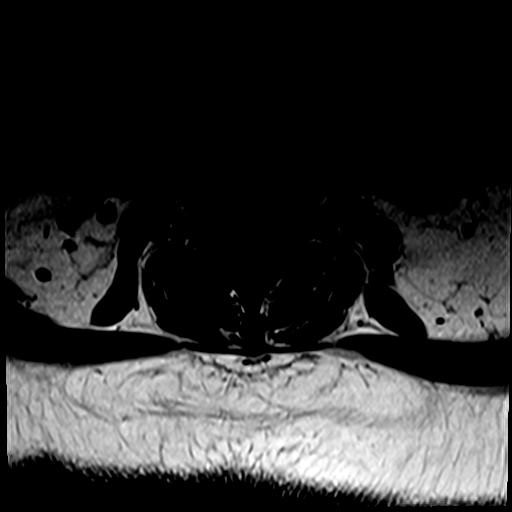
[im 17/29]
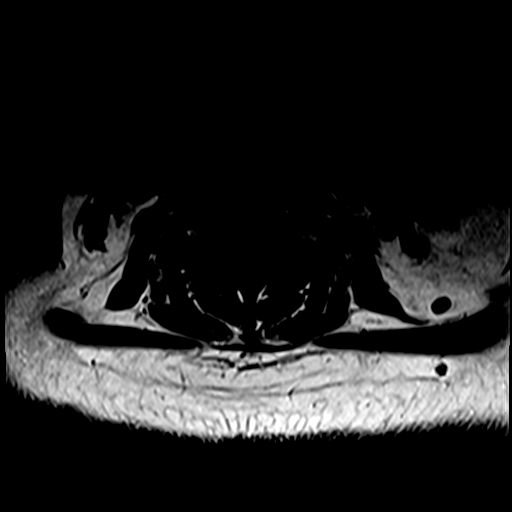
[im 21/29]
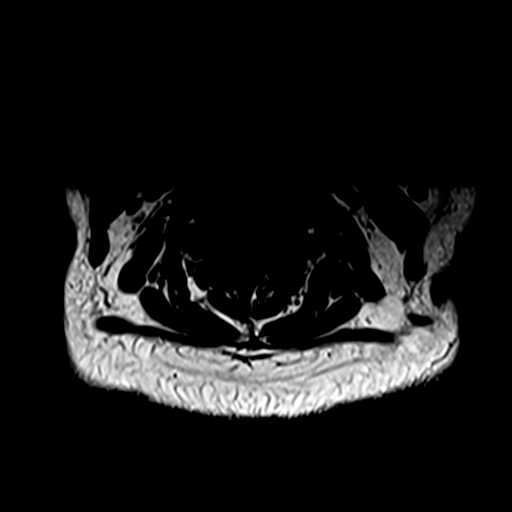
[im 25/29]
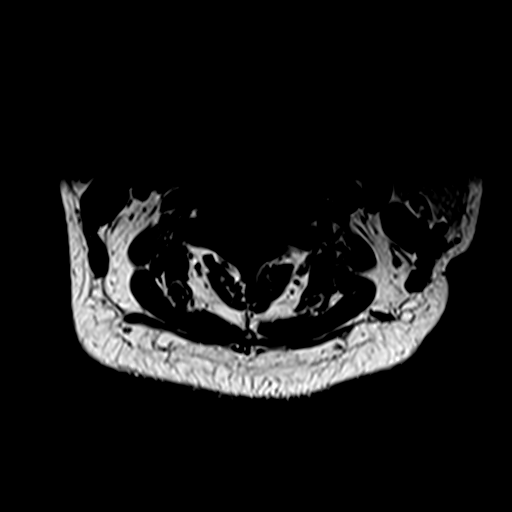
[im 29/29]
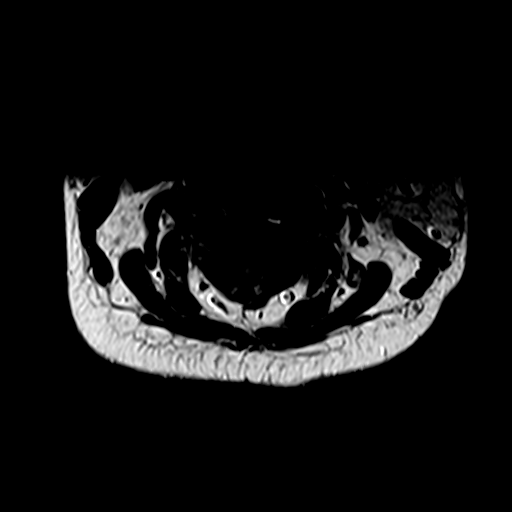

[Series 12: T1 post-contrast · axial · 3.0mm · 0.35mm/px · z∈[-24,+30]mm · 5 of 29 slices shown]
[im 1/29]
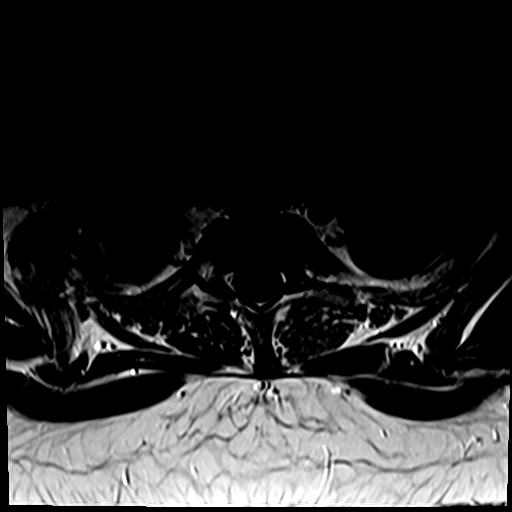
[im 5/29]
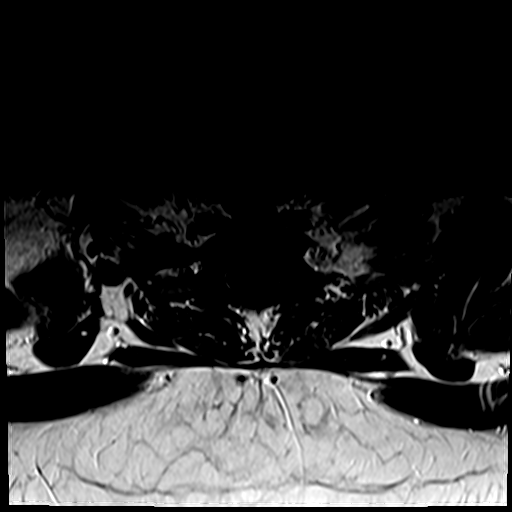
[im 9/29]
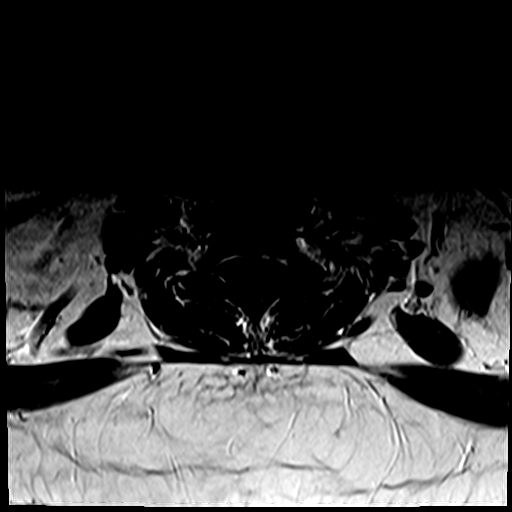
[im 13/29]
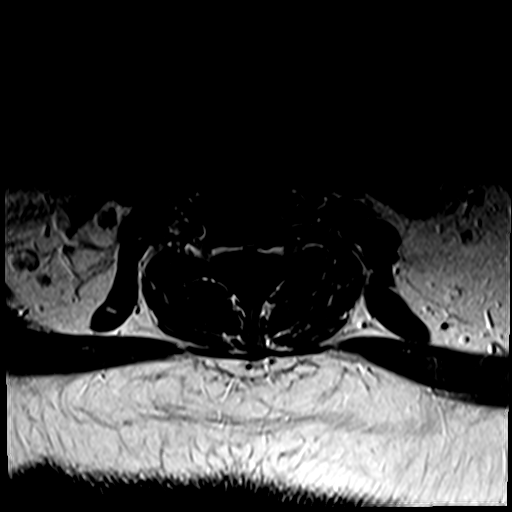
[im 17/29]
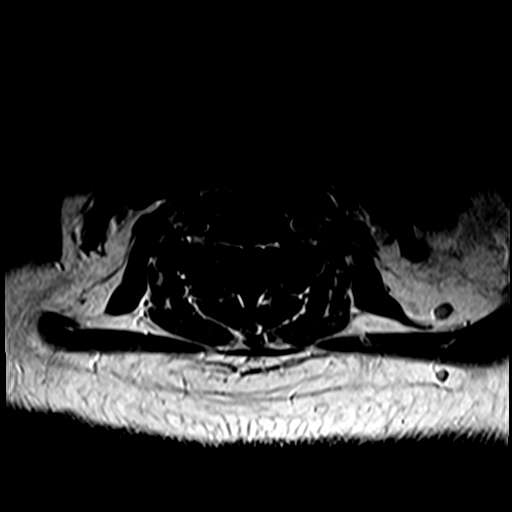

[29 of 48 positions shown; findings below may reference images not displayed]

FINDINGS: Alignment: Cervical spine straightening.  No listhesis.

Vertebrae: No fracture, suspicious osseous lesion, or significant
marrow edema.

Cord: Normal signal.  No abnormal intradural enhancement.

Posterior Fossa, vertebral arteries, paraspinal tissues: Capillary
telangiectasia in the pons as demonstrated on prior head MRI.
Preserved vertebral artery flow voids. Unremarkable paraspinal soft
tissues.

Disc levels:

C2-3: Negative.

C3-4: A small central disc extrusion with mild superior migration
results in mild spinal stenosis. Patent neural foramina.

C4-5: A small right paracentral disc protrusion results in mild
spinal stenosis. Patent neural foramina.

C5-6: Moderate disc space narrowing. A broad-based posterior disc
osteophyte complex eccentric to the left results in mild spinal
stenosis. Patent neural foramina.

C6-7: Mild disc space narrowing. Small left paracentral disc
protrusion without stenosis.

C7-T1: Negative.
IMPRESSION: 1. Small disc herniations at C3-4, C4-5, and C5-6 resulting in mild
spinal stenosis.
2. No cervical spinal cord signal abnormality.

## 2022-08-26 ENCOUNTER — Other Ambulatory Visit: Payer: Self-pay

## 2022-08-26 ENCOUNTER — Emergency Department: Payer: 59

## 2022-08-26 ENCOUNTER — Emergency Department
Admission: EM | Admit: 2022-08-26 | Discharge: 2022-08-26 | Disposition: A | Discharge status: Home / Self Care | Payer: 59 | Attending: Emergency Medicine | Admitting: Emergency Medicine

## 2022-08-26 DIAGNOSIS — I10 Essential (primary) hypertension: Secondary | ICD-10-CM | POA: Insufficient documentation

## 2022-08-26 DIAGNOSIS — Z1152 Encounter for screening for COVID-19: Secondary | ICD-10-CM | POA: Insufficient documentation

## 2022-08-26 DIAGNOSIS — E1165 Type 2 diabetes mellitus with hyperglycemia: Secondary | ICD-10-CM | POA: Diagnosis not present

## 2022-08-26 DIAGNOSIS — R739 Hyperglycemia, unspecified: Secondary | ICD-10-CM

## 2022-08-26 DIAGNOSIS — R079 Chest pain, unspecified: Secondary | ICD-10-CM

## 2022-08-26 DIAGNOSIS — R6883 Chills (without fever): Secondary | ICD-10-CM | POA: Diagnosis present

## 2022-08-26 LAB — COMPREHENSIVE METABOLIC PANEL
ALT: 12 U/L (ref 0–44)
AST: 18 U/L (ref 15–41)
Albumin: 3.2 g/dL — ABNORMAL LOW (ref 3.5–5.0)
Alkaline Phosphatase: 153 U/L — ABNORMAL HIGH (ref 38–126)
Anion gap: 12 (ref 5–15)
BUN: 12 mg/dL (ref 6–20)
CO2: 24 mmol/L (ref 22–32)
Calcium: 9 mg/dL (ref 8.9–10.3)
Chloride: 92 mmol/L — ABNORMAL LOW (ref 98–111)
Creatinine, Ser: 0.55 mg/dL (ref 0.44–1.00)
GFR, Estimated: >60 mL/min (ref >60)
Glucose, Bld: 476 mg/dL — ABNORMAL HIGH (ref 70–99)
Potassium: 3.6 mmol/L (ref 3.5–5.1)
Sodium: 128 mmol/L — ABNORMAL LOW (ref 135–145)
Total Bilirubin: 0.5 mg/dL (ref 0.3–1.2)
Total Protein: 8.6 g/dL — ABNORMAL HIGH (ref 6.5–8.1)

## 2022-08-26 LAB — URINE DRUG SCREEN, QUALITATIVE (ARMC ONLY)
Amphetamines, Ur Screen: NOT DETECTED
Barbiturates, Ur Screen: NOT DETECTED
Benzodiazepine, Ur Scrn: NOT DETECTED
Cannabinoid 50 Ng, Ur ~~LOC~~: NOT DETECTED
Cocaine Metabolite,Ur ~~LOC~~: NOT DETECTED
MDMA (Ecstasy)Ur Screen: NOT DETECTED
Methadone Scn, Ur: NOT DETECTED
Opiate, Ur Screen: NOT DETECTED
Phencyclidine (PCP) Ur S: NOT DETECTED
Tricyclic, Ur Screen: POSITIVE — AB

## 2022-08-26 LAB — PROCALCITONIN: Procalcitonin: <0.1 ng/mL

## 2022-08-26 LAB — CBC WITH DIFFERENTIAL/PLATELET
Abs Immature Granulocytes: 0.03 10*3/uL (ref 0.00–0.07)
Basophils Absolute: 0 10*3/uL (ref 0.0–0.1)
Basophils Relative: 0 %
Eosinophils Absolute: 0.1 10*3/uL (ref 0.0–0.5)
Eosinophils Relative: 1 %
HCT: 36.7 % (ref 36.0–46.0)
Hemoglobin: 11.3 g/dL — ABNORMAL LOW (ref 12.0–15.0)
Immature Granulocytes: 0 %
Lymphocytes Relative: 23 %
Lymphs Abs: 2 10*3/uL (ref 0.7–4.0)
MCH: 23.3 pg — ABNORMAL LOW (ref 26.0–34.0)
MCHC: 30.8 g/dL (ref 30.0–36.0)
MCV: 75.7 fL — ABNORMAL LOW (ref 80.0–100.0)
Monocytes Absolute: 0.8 10*3/uL (ref 0.1–1.0)
Monocytes Relative: 9 %
Neutro Abs: 5.6 10*3/uL (ref 1.7–7.7)
Neutrophils Relative %: 67 %
Platelets: 425 10*3/uL — ABNORMAL HIGH (ref 150–400)
RBC: 4.85 MIL/uL (ref 3.87–5.11)
RDW: 14.7 % (ref 11.5–15.5)
WBC: 8.5 10*3/uL (ref 4.0–10.5)
nRBC: 0 % (ref 0.0–0.2)

## 2022-08-26 LAB — CBG MONITORING, ED
Glucose-Capillary: 361 mg/dL — ABNORMAL HIGH (ref 70–99)
Glucose-Capillary: 244 mg/dL — ABNORMAL HIGH (ref 70–99)

## 2022-08-26 LAB — MAGNESIUM: Magnesium: 2.2 mg/dL (ref 1.7–2.4)

## 2022-08-26 LAB — TROPONIN I (HIGH SENSITIVITY)
Troponin I (High Sensitivity): 5 ng/L (ref <18)
Troponin I (High Sensitivity): 5 ng/L (ref <18)

## 2022-08-26 LAB — URINALYSIS, ROUTINE W REFLEX MICROSCOPIC
Bacteria, UA: NONE SEEN
Bilirubin Urine: NEGATIVE
Glucose, UA: >=500 mg/dL — AB
Hgb urine dipstick: NEGATIVE
Ketones, ur: 5 mg/dL — AB
Nitrite: NEGATIVE
Protein, ur: NEGATIVE mg/dL
Specific Gravity, Urine: 1.03 (ref 1.005–1.030)
pH: 5 (ref 5.0–8.0)

## 2022-08-26 LAB — RESP PANEL BY RT-PCR (RSV, FLU A&B, COVID)  RVPGX2
Influenza A by PCR: NEGATIVE
Influenza B by PCR: NEGATIVE
Resp Syncytial Virus by PCR: NEGATIVE
SARS Coronavirus 2 by RT PCR: NEGATIVE

## 2022-08-26 LAB — D-DIMER, QUANTITATIVE: D-Dimer, Quant: 0.4 ug/mL-FEU (ref 0.00–0.50)

## 2022-08-26 LAB — POC URINE PREG, ED: Preg Test, Ur: NEGATIVE

## 2022-08-26 LAB — TSH: TSH: 1.838 u[IU]/mL (ref 0.350–4.500)

## 2022-08-26 MED ORDER — INSULIN ASPART 100 UNIT/ML IJ SOLN
5.0000 [IU] | Freq: Once | INTRAMUSCULAR | Status: AC
  Administered 2022-08-26: 5 [IU] via INTRAVENOUS
  Filled 2022-08-26: qty 1

## 2022-08-26 MED ORDER — SODIUM CHLORIDE 0.9 % IV BOLUS (SEPSIS)
1000.0000 mL | Freq: Once | INTRAVENOUS | Status: AC
  Administered 2022-08-26: 1000 mL via INTRAVENOUS

## 2022-08-26 MED ORDER — ACETAMINOPHEN 500 MG PO TABS
1000.0000 mg | ORAL_TABLET | Freq: Once | ORAL | Status: AC
  Administered 2022-08-26: 1000 mg via ORAL
  Filled 2022-08-26: qty 2

## 2022-08-26 MED ORDER — METFORMIN HCL ER 500 MG PO TB24: 500.0000 mg | ORAL_TABLET | Freq: Every day | ORAL | 0 refills | Status: DC

## 2022-08-26 NOTE — Discharge Instructions (Addendum)
You may alternate Tylenol 1000 mg every 6 hours as needed for pain, fever and Ibuprofen 800 mg every 6-8 hours as needed for pain, fever.  Please take Ibuprofen with food.  Do not take more than 4000 mg of Tylenol (acetaminophen) in a 24 hour period.  Your blood pressure here improved without intervention.  Your heart rate is elevated but this appears chronic for you and I recommend follow-up with your PCP for this.  Your blood sugar was elevated and I recommend that we restart you on metformin daily.  Please take this with food.  You may notice over the first few days that it can cause nausea and diarrhea but this will improve with time.  Your cardiac labs were normal today showing no sign of heart attack.  We did a blood test that ruled out blood clots.  Your COVID and flu test were negative.  Your chest x-ray was clear.  There is no sign of any life-threatening process causing your chest pain today but if it continues to occur, we have placed a referral to cardiology.

## 2022-08-26 NOTE — ED Provider Notes (Signed)
[facility] Provider Note    Event Date/Time   First MD Initiated Contact with Patient 08/26/22 0149     (approximate)   History   Hypertension   HPI  Mary Velasquez is a 42 y.o. female history of type II diabetes who presents emergency department concerns for chest pain, chills, hypertension, tachycardia, feeling like her skin is painful to touch started today.  No known fevers, cough, shortness of breath, vomiting, diarrhea, urinary symptoms.  No history of PE or DVT.  No history of hypertension.  States the highest her blood pressure was at home was 151/109.   History provided by patient.    Past Medical History:  Diagnosis Date   AVM (arteriovenous malformation)    Diabetes mellitus without complication ([place])    Peripheral neuropathy     Past Surgical History:  Procedure Laterality Date   ABDOMINAL SURGERY     BREAST SURGERY     CESAREAN SECTION     ESOPHAGOGASTRODUODENOSCOPY N/A 07/10/2018   Procedure: ESOPHAGOGASTRODUODENOSCOPY (EGD);  Surgeon: Lin Landsman, MD;  Location: [facility] ENDOSCOPY;  Service: Gastroenterology;  Laterality: N/A;   TUBAL LIGATION      MEDICATIONS:  Prior to Admission medications   Medication Sig Start Date End Date Taking? Authorizing Provider  gabapentin (NEURONTIN) 300 MG capsule Take 600 mg by mouth at bedtime. 08/13/21   [provider]  nortriptyline (PAMELOR) 50 MG capsule Take 100 mg by mouth at bedtime. 08/13/21   [provider]    Physical Exam   Triage Vital Signs: ED Triage Vitals  Enc Vitals Group     BP 08/26/22 0104 (!) 143/104     Pulse Rate 08/26/22 0104 (!) 131     Resp 08/26/22 0104 20     Temp 08/26/22 0104 (!) 97.5 F (36.4 C)     Temp Source 08/26/22 0104 Oral     SpO2 08/26/22 0104 99 %     Weight 08/26/22 0105 152 lb 1.9 oz (69 kg)     Height 08/26/22 0105 5' (1.524 m)     Head Circumference --      Peak Flow --      Pain Score 08/26/22 0105 6     Pain Loc  --      Pain Edu? --      Excl. in [place]? --     Most recent vital signs: Vitals:   08/26/22 0400 08/26/22 0430  BP: 135/80 (!) 105/93  Pulse: (!) 110 (!) 115  Resp: 15 (!) 21  Temp:    SpO2: 95% 98%    CONSTITUTIONAL: Alert, responds appropriately to questions. Well-appearing; well-nourished HEAD: Normocephalic, atraumatic EYES: Conjunctivae clear, pupils appear equal, sclera nonicteric ENT: normal nose; moist mucous membranes NECK: Supple, normal ROM CARD: Regular and tachycardic; S1 and S2 appreciated RESP: Normal chest excursion without splinting or tachypnea; breath sounds clear and equal bilaterally; no wheezes, no rhonchi, no rales, no hypoxia or respiratory distress, speaking full sentences ABD/GI: Non-distended; soft, non-tender, no rebound, no guarding, no peritoneal signs BACK: The back appears normal EXT: Normal ROM in all joints; no deformity noted, no edema SKIN: Normal color for age and race; warm; no rash on exposed skin, skin feels warm to touch NEURO: Moves all extremities equally, normal speech PSYCH: The patient's mood and manner are appropriate.   ED Results / Procedures / Treatments   LABS: (all labs ordered are listed, but only abnormal results are displayed) Labs Reviewed  CBC WITH  DIFFERENTIAL/PLATELET - Abnormal; Notable for the following components:      Result Value   Hemoglobin 11.3 (*)    MCV 75.7 (*)    MCH 23.3 (*)    Platelets 425 (*)    All other components within normal limits  COMPREHENSIVE METABOLIC PANEL - Abnormal; Notable for the following components:   Sodium 128 (*)    Chloride 92 (*)    Glucose, Bld 476 (*)    Total Protein 8.6 (*)    Albumin 3.2 (*)    Alkaline Phosphatase 153 (*)    All other components within normal limits  URINALYSIS, ROUTINE W REFLEX MICROSCOPIC - Abnormal; Notable for the following components:   Color, Urine STRAW (*)    APPearance CLEAR (*)    Glucose, UA >=500 (*)    Ketones, ur 5 (*)     Leukocytes,Ua TRACE (*)    All other components within normal limits  URINE DRUG SCREEN, QUALITATIVE ([facility] ONLY) - Abnormal; Notable for the following components:   Tricyclic, Ur Screen POSITIVE (*)    All other components within normal limits  CBG MONITORING, ED - Abnormal; Notable for the following components:   Glucose-Capillary 361 (*)    All other components within normal limits  CBG MONITORING, ED - Abnormal; Notable for the following components:   Glucose-Capillary 244 (*)    All other components within normal limits  POC URINE PREG, ED - Normal  RESP PANEL BY RT-PCR (RSV, FLU A&B, COVID)  RVPGX2  PROCALCITONIN  MAGNESIUM  TSH  D-DIMER, QUANTITATIVE (NOT AT [facility])  TROPONIN I (HIGH SENSITIVITY)  TROPONIN I (HIGH SENSITIVITY)     EKG:  EKG Interpretation  Date/Time:  Monday August 26 2022 01:06:32 EST Ventricular Rate:  125 PR Interval:  126 QRS Duration: 68 QT Interval:  312 QTC Calculation: 450 R Axis:   0 Text Interpretation: Sinus tachycardia Anterior infarct (cited on or before 13-Dec-2021) Abnormal ECG When compared with ECG of 13-Dec-2021 21:34, No significant change was found Confirmed by Pryor Curia [id number]) on 08/26/2022 1:24:33 AM         RADIOLOGY: My personal review and interpretation of imaging: Chest x-ray clear  I have personally reviewed all radiology reports.   DG Chest Portable 1 View  Result Date: 08/26/2022 CLINICAL DATA:  Elevated blood pressure and diffuse pain. EXAM: PORTABLE CHEST 1 VIEW COMPARISON:  December 13, 2021 FINDINGS: The heart size and mediastinal contours are within normal limits. Low lung volumes are noted. Both lungs are clear. The visualized skeletal structures are unremarkable. IMPRESSION: No active disease. Electronically Signed   By: Virgina Norfolk M.D.   On: 08/26/2022 02:25     PROCEDURES:  Critical Care performed: No      .1-3 Lead EKG Interpretation  Performed by: Emil Klassen, Delice Bison, DO Authorized by: Mindie Rawdon,  Delice Bison, DO     Interpretation: abnormal     ECG rate:  131   ECG rate assessment: tachycardic     Rhythm: sinus tachycardia     Ectopy: none     Conduction: normal       IMPRESSION / MDM / ASSESSMENT AND PLAN / ED COURSE  I reviewed the triage vital signs and the nursing notes.    Patient here with tachycardia, hypertension at home, chills, chest pain.  The patient is on the cardiac monitor to evaluate for evidence of arrhythmia and/or significant heart rate changes.   DIFFERENTIAL DIAGNOSIS (includes but not limited to):   ACS, PE, pneumonia,  pneumothorax, anemia, electrolyte derangement, thyroid dysfunction, viral URI   Patient's presentation is most consistent with acute presentation with potential threat to life or bodily function.   PLAN: Will obtain CBC, CMP, TSH, magnesium, troponin x 2, D-dimer, urinalysis.  EKG shows sinus tachycardia.  Will give IV fluids, Tylenol.  She feels warm to touch but normal oral temperature is here.  COVID and flu swabs pending.   MEDICATIONS GIVEN IN ED: Medications  sodium chloride 0.9 % bolus 1,000 mL (0 mLs Intravenous Stopped 08/26/22 0321)  sodium chloride 0.9 % bolus 1,000 mL (0 mLs Intravenous Stopped 08/26/22 0321)  acetaminophen (TYLENOL) tablet 1,000 mg (1,000 mg Oral Given 08/26/22 0220)  insulin aspart (novoLOG) injection 5 Units (5 Units Intravenous Given 08/26/22 0532)  sodium chloride 0.9 % bolus 1,000 mL (1,000 mLs Intravenous New Bag/Given 08/26/22 0536)     ED COURSE: 4:40 AM  Patient hemoglobin is 11.3.  No leukocytosis.  Blood glucose 476 but normal bicarb and no anion gap.  First troponin negative.  D-dimer negative.  COVID and flu negative.  Procalcitonin normal.  Normal electrolytes, TSH.  Chest x-ray reviewed and interpreted by myself and the radiologist and is unremarkable.  Blood sugar has improved to 361 after IV fluids.  She reports noncompliance with her metformin.  Will give IV insulin here.  Will encourage oral  fluids.  Urine pending.   6:43 AM Pt's urine shows no sign of infection.  Pregnancy test negative.  Blood sugar now down to 244.  Will restart her metformin for home and have advised her to follow-up closely with her PCP.  She is still intermittently tachycardic in the low upper 100s and low 110's.  On review of her records this appears chronic for her and she states that this is not abnormal for her.  I feel she is safe for discharge.  Recommended Tylenol, Motrin at home for fevers, pain.  Pressure is currently 105/93.  No indication that she needs to be started on blood pressure medicine from the ED.  At this time, I do not feel there is any life-threatening condition present. I reviewed all nursing notes, vitals, pertinent previous records.  All lab and urine results, EKGs, imaging ordered have been independently reviewed and interpreted by myself.  I reviewed all available radiology reports from any imaging ordered this visit.  Based on my assessment, I feel the patient is safe to be discharged home without further emergent workup and can continue workup as an outpatient as needed. Discussed all findings, treatment plan as well as usual and customary return precautions.  They verbalize understanding and are comfortable with this plan.  Outpatient follow-up has been provided as needed.  All questions have been answered.   CONSULTS:  none   OUTSIDE RECORDS REVIEWED: Reviewed last neurology note on 06/03/2022.       FINAL CLINICAL IMPRESSION(S) / ED DIAGNOSES   Final diagnoses:  Chest pain, unspecified type  Hyperglycemia     Rx / DC Orders   ED Discharge Orders          Ordered    metFORMIN (GLUCOPHAGE-XR) 500 MG 24 hr tablet  Daily with breakfast        08/26/22 [id number]             Note:  This document was prepared using Dragon voice recognition software and may include unintentional dictation errors.   Ekansh Sherk, Delice Bison, DO 08/26/22 [phone]

## 2022-08-26 NOTE — ED Triage Notes (Addendum)
Pt arrives via POV with CC of high blood pressures at home (approx 140/105) and generalized pain. When asked where pt is hurting, pt states, "I don't know. Its all over. Its not in the bones but its like right under the skin." Denies medication changes - alert and oriented.

## 2022-08-26 NOTE — ED Notes (Signed)
Pt A&O x4, no obvious distress noted, respirations regular/unlabored. Pt verbalizes understanding of discharge instructions. Pt able to ambulate from ED independently.

## 2022-10-24 ENCOUNTER — Other Ambulatory Visit: Payer: Self-pay

## 2022-10-24 ENCOUNTER — Encounter: Payer: Self-pay | Admitting: Cardiology

## 2022-10-24 ENCOUNTER — Ambulatory Visit: Payer: 59 | Attending: Cardiology | Admitting: Cardiology

## 2022-10-24 ENCOUNTER — Encounter: Payer: Self-pay | Admitting: Pharmacist

## 2022-10-24 VITALS — BP 120/86 | HR 117 | Ht 60.0 in | Wt 153.8 lb

## 2022-10-24 DIAGNOSIS — I4711 Inappropriate sinus tachycardia, so stated: Secondary | ICD-10-CM

## 2022-10-24 MED ORDER — IVABRADINE HCL 5 MG PO TABS
5.0000 mg | ORAL_TABLET | Freq: Two times a day (BID) | ORAL | 3 refills | Status: DC
  Filled 2022-10-24: qty 60, 30d supply, fill #0

## 2022-10-24 NOTE — Patient Instructions (Signed)
Medication Instructions:   START Corlanor - take one tablet ( ) by mouth twice a day.   *If you need a refill on your cardiac medications before your next appointment, please call your pharmacy*   Lab Work:  None Ordered  If you have labs (blood work) drawn today and your tests are completely normal, you will receive your results only by: MyChart Message (if you have MyChart) OR A paper copy in the mail If you have any lab test that is abnormal or we need to change your treatment, we will call you to review the results.   Testing/Procedures:  Your physician has requested that you have an echocardiogram. Echocardiography is a painless test that uses sound waves to create images of your heart. It provides your doctor with information about the size and shape of your heart and how well your heart's chambers and valves are working. This procedure takes approximately one hour. There are no restrictions for this procedure. Please do NOT wear cologne, perfume, aftershave, or lotions (deodorant is allowed). Please arrive 15 minutes prior to your appointment time.    Follow-Up: At [facility], you and your health needs are our priority.  As part of our continuing mission to provide you with exceptional heart care, we have created designated Provider Care Teams.  These Care Teams include your primary Cardiologist (physician) and Advanced Practice Providers (APPs -  Physician Assistants and Nurse Practitioners) who all work together to provide you with the care you need, when you need it.  We recommend signing up for the patient portal called "MyChart".  Sign up information is provided on this After Visit Summary.  MyChart is used to connect with patients for Virtual Visits (Telemedicine).  Patients are able to view lab/test results, encounter notes, upcoming appointments, etc.  Non-urgent messages can be sent to your provider as well.   To learn more about what you can do with  MyChart, go to [email or web address].    Your next appointment:    After Echocardiogram  Provider:   Debbe Odea, MD ONLY

## 2022-10-24 NOTE — Progress Notes (Signed)
Cardiology Office Note:    Date:  10/24/2022   ID:  Mary Velasquez, DOB 1981/04/08, MRN [id number]  PCP:  Lonell Face, MD   [facility] Providers Cardiologist:  Debbe Odea, MD     Referring MD: Lonell Face, MD   Chief Complaint  Patient presents with   New Patient (Initial Visit)    Previous cardiac work up with [facility] cardiology in 2003.  Here for evaluation of elevated blood pressure and tachycardia.  Does have ulcers and discoloration on lower right leg.   Mary Velasquez is a 42 y.o. female who is being seen today for the evaluation of tachycardia at the request of Lonell Face, MD.   History of Present Illness:    Mary Velasquez is a 42 y.o. female with a hx of diabetes, AVM, peripheral neuropathy who presents due to elevated heart rates.  She has elevated heart rates ongoing for at least a year and a half.  States having neuropathy secondary to diabetes which is adequately controlled on gabapentin and nortriptyline.  She denies any personal history of heart disease or heart attack.  Denies smoking, denies chest pain or shortness of breath.  Her blood pressure is usually normal, occasionally gets dizzy with standing and systolics in the 80s.  Past Medical History:  Diagnosis Date   AVM (arteriovenous malformation)    Diabetes mellitus without complication    Peripheral neuropathy     Past Surgical History:  Procedure Laterality Date   ABDOMINAL SURGERY     BREAST SURGERY     CESAREAN SECTION     ESOPHAGOGASTRODUODENOSCOPY N/A 07/10/2018   Procedure: ESOPHAGOGASTRODUODENOSCOPY (EGD);  Surgeon: Toney Reil, MD;  Location: [facility] ENDOSCOPY;  Service: Gastroenterology;  Laterality: N/A;   TUBAL LIGATION      Current Medications: Current Meds  Medication Sig   gabapentin (NEURONTIN) 300 MG capsule Take 600 mg by mouth at bedtime.   ivabradine (CORLANOR) 5 MG TABS tablet Take 1 tablet (5 mg total) by mouth 2 (two) times daily with a meal.    nortriptyline (PAMELOR) 50 MG capsule Take 100 mg by mouth at bedtime.     Allergies:   Oxycodone and Oxycontin [oxycodone hcl]   Social History   Socioeconomic History   Marital status: Single    Spouse name: Not on file   Number of children: Not on file   Years of education: Not on file   Highest education level: Not on file  Occupational History   Not on file  Tobacco Use   Smoking status: Never   Smokeless tobacco: Never  Vaping Use   Vaping Use: Never used  Substance and Sexual Activity   Alcohol use: No   Drug use: Not Currently   Sexual activity: Not on file  Other Topics Concern   Not on file  Social History Narrative   Not on file   Social Determinants of Health   Financial Resource Strain: Not on file  Food Insecurity: Not on file  Transportation Needs: Not on file  Physical Activity: Not on file  Stress: Not on file  Social Connections: Not on file     Family History: The patient's family history includes Heart attack in her cousin and maternal grandfather; Heart disease in her cousin, maternal grandfather, and paternal aunt. There is no history of CAD or Seizures.  ROS:   Please see the history of present illness.     All other systems reviewed and are negative.  EKGs/Labs/Other Studies Reviewed:    The following studies were reviewed today:   EKG:  EKG is  ordered today.  The ekg ordered today demonstrates sinus tachycardia, otherwise normal ECG.  Heart rate 117.  Recent Labs: 12/13/2021: B Natriuretic Peptide 12.9 08/26/2022: ALT 12; BUN 12; Creatinine, Ser 0.55; Hemoglobin 11.3; Magnesium 2.2; Platelets 425; Potassium 3.6; Sodium 128; TSH 1.838  Recent Lipid Panel No results found for: "CHOL", "TRIG", "HDL", "CHOLHDL", "VLDL", "LDLCALC", "LDLDIRECT"   Risk Assessment/Calculations:             Physical Exam:    VS:  BP 120/86 (BP Location: Right Arm, Patient Position: Sitting, Cuff Size: Normal)   Pulse (!) 117   Ht 5' (1.524 m)   Wt  153 lb 12.8 oz (69.8 kg)   SpO2 99%   BMI 30.04 kg/m     Wt Readings from Last 3 Encounters:  10/24/22 153 lb 12.8 oz (69.8 kg)  08/26/22 152 lb 1.9 oz (69 kg)  02/20/22 160 lb (72.6 kg)     GEN:  Well nourished, well developed in no acute distress HEENT: Normal NECK: No JVD; No carotid bruits CARDIAC: RRR, no murmurs, rubs, gallops RESPIRATORY:  Clear to auscultation without rales, wheezing or rhonchi  ABDOMEN: Soft, non-tender, non-distended MUSCULOSKELETAL:  No edema; No deformity  SKIN: Warm and dry NEUROLOGIC:  Alert and oriented x 3 PSYCHIATRIC:  Normal affect   ASSESSMENT:    1. Inappropriate sinus tachycardia    PLAN:    In order of problems listed above:  Inappropriate sinus tachycardia, get echocardiogram to rule out any structural abnormalities.  Start ivabradine 5 mg twice daily.  Follow-up after echo.      Medication Adjustments/Labs and Tests Ordered: Current medicines are reviewed at length with the patient today.  Concerns regarding medicines are outlined above.  Orders Placed This Encounter  Procedures   EKG 12-Lead   ECHOCARDIOGRAM COMPLETE   Meds ordered this encounter  Medications   ivabradine (CORLANOR) 5 MG TABS tablet    Sig: Take 1 tablet (5 mg total) by mouth 2 (two) times daily with a meal.    Dispense:  60 tablet    Refill:  3    Patient Instructions  Medication Instructions:   START Corlanor - take one tablet ( 5mg ) by mouth twice a day.   *If you need a refill on your cardiac medications before your next appointment, please call your pharmacy*   Lab Work:  None Ordered  If you have labs (blood work) drawn today and your tests are completely normal, you will receive your results only by: MyChart Message (if you have MyChart) OR A paper copy in the mail If you have any lab test that is abnormal or we need to change your treatment, we will call you to review the results.   Testing/Procedures:  Your physician has  requested that you have an echocardiogram. Echocardiography is a painless test that uses sound waves to create images of your heart. It provides your doctor with information about the size and shape of your heart and how well your heart's chambers and valves are working. This procedure takes approximately one hour. There are no restrictions for this procedure. Please do NOT wear cologne, perfume, aftershave, or lotions (deodorant is allowed). Please arrive 15 minutes prior to your appointment time.    Follow-Up: At [facility], you and your health needs are our priority.  As part of our continuing mission to provide you with exceptional  heart care, we have created designated Provider Care Teams.  These Care Teams include your primary Cardiologist (physician) and Advanced Practice Providers (APPs -  Physician Assistants and Nurse Practitioners) who all work together to provide you with the care you need, when you need it.  We recommend signing up for the patient portal called "MyChart".  Sign up information is provided on this After Visit Summary.  MyChart is used to connect with patients for Virtual Visits (Telemedicine).  Patients are able to view lab/test results, encounter notes, upcoming appointments, etc.  Non-urgent messages can be sent to your provider as well.   To learn more about what you can do with MyChart, go to [email or web address].    Your next appointment:    After Echocardiogram  Provider:   Debbe Odea, MD ONLY        Signed, Debbe Odea, MD  10/24/2022 12:09 PM    [facility]

## 2022-10-25 ENCOUNTER — Telehealth: Payer: Self-pay | Admitting: Cardiology

## 2022-10-25 ENCOUNTER — Other Ambulatory Visit: Payer: Self-pay

## 2022-10-25 MED ORDER — METOPROLOL TARTRATE 25 MG PO TABS: 12.5000 mg | ORAL_TABLET | Freq: Two times a day (BID) | ORAL | 3 refills | Status: DC

## 2022-10-25 NOTE — Telephone Encounter (Signed)
Spoke with patient.  Made her aware of Dr. Amaryllis Dyke Recommendations.  Patient was agreeable.  Pharmacy comfirmed.

## 2022-10-25 NOTE — Telephone Encounter (Signed)
Pt c/o medication issue:  1. Name of Medication: ivabradine (CORLANOR) 5 MG TABS tablet   2. How are you currently taking this medication (dosage and times per day)? Take 1 tablet (5 mg total) by mouth 2 (two) times daily with a meal.   3. Are you having a reaction (difficulty breathing--STAT)? No  4. What is your medication issue? Patient went to get this medication and it was going to cost the patient $500. Patient tried to get a co pay card online to help with the cost, but was having issues. Patient would like to know if she can get this medication for a cheaper price or if there is a cheaper alternative to this medication. Please advise.

## 2022-10-25 NOTE — Telephone Encounter (Signed)
Called pt in regards to cost of Corlanor.  Advised pt to [employer] co pay card; there is an offer for as low as $20 a month with commercial insurance.  Pt reports tried this but never received an email. Gave pt the number for Corlanor pt assistance. Pt reports will call to see if qualifies.   Pt wants to know if there is an alternate med she can take if can't afford Corlanor. Will send to primary Covering to follow up.

## 2022-11-14 ENCOUNTER — Encounter: Payer: Self-pay | Admitting: Cardiology

## 2022-11-21 ENCOUNTER — Ambulatory Visit: Payer: 59 | Attending: Cardiology

## 2022-11-21 DIAGNOSIS — I4711 Inappropriate sinus tachycardia, so stated: Secondary | ICD-10-CM

## 2022-11-21 LAB — ECHOCARDIOGRAM COMPLETE
AR max vel: 2.51 cm2
AV Area VTI: 2.12 cm2
AV Area mean vel: 2.41 cm2
AV Mean grad: 4 mmHg
AV Peak grad: 7.8 mmHg
Ao pk vel: 1.4 m/s
Area-P 1/2: 5.2 cm2
Calc EF: 56.3 %
S' Lateral: 2.65 cm
Single Plane A2C EF: 57.8 %
Single Plane A4C EF: 54.6 %

## 2022-11-26 ENCOUNTER — Telehealth: Payer: Self-pay | Admitting: Cardiology

## 2022-11-26 NOTE — Telephone Encounter (Signed)
Patient is returning CMA's call for her echo results.   Please advise.

## 2022-11-26 NOTE — Telephone Encounter (Signed)
The patient has been notified of the result and verbalized understanding.  All questions (if any) were answered. Margrett Rud, [place] 11/26/2022 12:38 PM

## 2022-12-09 ENCOUNTER — Ambulatory Visit: Payer: 59 | Attending: Cardiology | Admitting: Cardiology

## 2022-12-12 ENCOUNTER — Encounter: Payer: Self-pay | Admitting: Cardiology

## 2023-01-15 ENCOUNTER — Observation Stay: Payer: 59

## 2023-01-15 ENCOUNTER — Observation Stay
Admission: EM | Admit: 2023-01-15 | Discharge: 2023-01-16 | Disposition: A | Discharge status: Home / Self Care | Payer: 59 | Attending: Hospitalist | Admitting: Hospitalist

## 2023-01-15 ENCOUNTER — Encounter: Payer: Self-pay | Admitting: Emergency Medicine

## 2023-01-15 ENCOUNTER — Other Ambulatory Visit: Payer: Self-pay

## 2023-01-15 ENCOUNTER — Other Ambulatory Visit (HOSPITAL_COMMUNITY): Payer: Self-pay

## 2023-01-15 DIAGNOSIS — D649 Anemia, unspecified: Secondary | ICD-10-CM | POA: Diagnosis not present

## 2023-01-15 DIAGNOSIS — Z7984 Long term (current) use of oral hypoglycemic drugs: Secondary | ICD-10-CM | POA: Insufficient documentation

## 2023-01-15 DIAGNOSIS — Z8774 Personal history of (corrected) congenital malformations of heart and circulatory system: Secondary | ICD-10-CM

## 2023-01-15 DIAGNOSIS — E119 Type 2 diabetes mellitus without complications: Secondary | ICD-10-CM | POA: Diagnosis not present

## 2023-01-15 DIAGNOSIS — E1149 Type 2 diabetes mellitus with other diabetic neurological complication: Secondary | ICD-10-CM | POA: Diagnosis present

## 2023-01-15 DIAGNOSIS — N920 Excessive and frequent menstruation with regular cycle: Secondary | ICD-10-CM | POA: Diagnosis not present

## 2023-01-15 DIAGNOSIS — R55 Syncope and collapse: Principal | ICD-10-CM | POA: Insufficient documentation

## 2023-01-15 DIAGNOSIS — E1342 Other specified diabetes mellitus with diabetic polyneuropathy: Secondary | ICD-10-CM

## 2023-01-15 DIAGNOSIS — N924 Excessive bleeding in the premenopausal period: Secondary | ICD-10-CM | POA: Diagnosis not present

## 2023-01-15 DIAGNOSIS — R000 Tachycardia, unspecified: Secondary | ICD-10-CM | POA: Insufficient documentation

## 2023-01-15 DIAGNOSIS — Z79899 Other long term (current) drug therapy: Secondary | ICD-10-CM | POA: Diagnosis not present

## 2023-01-15 DIAGNOSIS — E1142 Type 2 diabetes mellitus with diabetic polyneuropathy: Secondary | ICD-10-CM | POA: Insufficient documentation

## 2023-01-15 LAB — CBG MONITORING, ED
Glucose-Capillary: 362 mg/dL — ABNORMAL HIGH (ref 70–99)
Glucose-Capillary: 153 mg/dL — ABNORMAL HIGH (ref 70–99)
Glucose-Capillary: 140 mg/dL — ABNORMAL HIGH (ref 70–99)
Glucose-Capillary: 151 mg/dL — ABNORMAL HIGH (ref 70–99)
Glucose-Capillary: 196 mg/dL — ABNORMAL HIGH (ref 70–99)
Glucose-Capillary: 210 mg/dL — ABNORMAL HIGH (ref 70–99)
Glucose-Capillary: 205 mg/dL — ABNORMAL HIGH (ref 70–99)
Glucose-Capillary: 200 mg/dL — ABNORMAL HIGH (ref 70–99)
Glucose-Capillary: 206 mg/dL — ABNORMAL HIGH (ref 70–99)

## 2023-01-15 LAB — BASIC METABOLIC PANEL
Anion gap: 5 (ref 5–15)
BUN: 10 mg/dL (ref 6–20)
CO2: 23 mmol/L (ref 22–32)
Calcium: 7.2 mg/dL — ABNORMAL LOW (ref 8.9–10.3)
Chloride: 103 mmol/L (ref 98–111)
Creatinine, Ser: 0.54 mg/dL (ref 0.44–1.00)
GFR, Estimated: >60 mL/min (ref >60)
Glucose, Bld: 466 mg/dL — ABNORMAL HIGH (ref 70–99)
Potassium: 3.7 mmol/L (ref 3.5–5.1)
Sodium: 131 mmol/L — ABNORMAL LOW (ref 135–145)

## 2023-01-15 LAB — URINALYSIS, ROUTINE W REFLEX MICROSCOPIC
Bacteria, UA: NONE SEEN
Bilirubin Urine: NEGATIVE
Glucose, UA: >=500 mg/dL — AB
Ketones, ur: NEGATIVE mg/dL
Leukocytes,Ua: NEGATIVE
Nitrite: NEGATIVE
Protein, ur: NEGATIVE mg/dL
Specific Gravity, Urine: 1.03 (ref 1.005–1.030)
pH: 6 (ref 5.0–8.0)

## 2023-01-15 LAB — ABO/RH: ABO/RH(D): B POS

## 2023-01-15 LAB — PREPARE RBC (CROSSMATCH)

## 2023-01-15 LAB — CBC WITH DIFFERENTIAL/PLATELET
Abs Immature Granulocytes: 0.03 10*3/uL (ref 0.00–0.07)
Basophils Absolute: 0 10*3/uL (ref 0.0–0.1)
Basophils Relative: 0 %
Eosinophils Absolute: 0.1 10*3/uL (ref 0.0–0.5)
Eosinophils Relative: 1 %
HCT: 24.7 % — ABNORMAL LOW (ref 36.0–46.0)
Hemoglobin: 7.3 g/dL — ABNORMAL LOW (ref 12.0–15.0)
Immature Granulocytes: 0 %
Lymphocytes Relative: 20 %
Lymphs Abs: 1.3 10*3/uL (ref 0.7–4.0)
MCH: 21.2 pg — ABNORMAL LOW (ref 26.0–34.0)
MCHC: 29.6 g/dL — ABNORMAL LOW (ref 30.0–36.0)
MCV: 71.6 fL — ABNORMAL LOW (ref 80.0–100.0)
Monocytes Absolute: 0.8 10*3/uL (ref 0.1–1.0)
Monocytes Relative: 12 %
Neutro Abs: 4.5 10*3/uL (ref 1.7–7.7)
Neutrophils Relative %: 67 %
Platelets: 328 10*3/uL (ref 150–400)
RBC: 3.45 MIL/uL — ABNORMAL LOW (ref 3.87–5.11)
RDW: 15.8 % — ABNORMAL HIGH (ref 11.5–15.5)
WBC: 6.7 10*3/uL (ref 4.0–10.5)
nRBC: 0 % (ref 0.0–0.2)

## 2023-01-15 LAB — BETA-HYDROXYBUTYRIC ACID
Beta-Hydroxybutyric Acid: 0.18 mmol/L (ref 0.05–0.27)
Beta-Hydroxybutyric Acid: 0.11 mmol/L (ref 0.05–0.27)

## 2023-01-15 LAB — BLOOD GAS, VENOUS
Acid-Base Excess: 0.8 mmol/L (ref 0.0–2.0)
Bicarbonate: 27.1 mmol/L (ref 20.0–28.0)
O2 Saturation: 48 %
Patient temperature: 37
pCO2, Ven: 49 mmHg (ref 44–60)
pH, Ven: 7.35 (ref 7.25–7.43)
pO2, Ven: <31 mmHg — CL (ref 32–45)

## 2023-01-15 LAB — RETICULOCYTES
Immature Retic Fract: 28.1 % — ABNORMAL HIGH (ref 2.3–15.9)
RBC.: 4.15 MIL/uL (ref 3.87–5.11)
Retic Count, Absolute: 77.6 10*3/uL (ref 19.0–186.0)
Retic Ct Pct: 1.9 % (ref 0.4–3.1)

## 2023-01-15 LAB — FERRITIN: Ferritin: 3 ng/mL — ABNORMAL LOW (ref 11–307)

## 2023-01-15 LAB — IRON AND TIBC
Iron: 143 ug/dL (ref 28–170)
Saturation Ratios: 34 % — ABNORMAL HIGH (ref 10.4–31.8)
TIBC: 421 ug/dL (ref 250–450)
UIBC: 278 ug/dL

## 2023-01-15 LAB — HEMOGLOBIN AND HEMATOCRIT, BLOOD
HCT: 29.2 % — ABNORMAL LOW (ref 36.0–46.0)
Hemoglobin: 9.1 g/dL — ABNORMAL LOW (ref 12.0–15.0)

## 2023-01-15 LAB — VITAMIN B12: Vitamin B-12: 228 pg/mL (ref 180–914)

## 2023-01-15 LAB — POC URINE PREG, ED: Preg Test, Ur: NEGATIVE

## 2023-01-15 LAB — HIV ANTIBODY (ROUTINE TESTING W REFLEX): HIV Screen 4th Generation wRfx: NONREACTIVE

## 2023-01-15 LAB — FOLATE: Folate: 14 ng/mL (ref >5.9)

## 2023-01-15 MED ORDER — ENOXAPARIN SODIUM 40 MG/0.4ML IJ SOSY
40.0000 mg | PREFILLED_SYRINGE | INTRAMUSCULAR | Status: DC
  Administered 2023-01-15: 40 mg via SUBCUTANEOUS
  Filled 2023-01-15: qty 0.4

## 2023-01-15 MED ORDER — POTASSIUM CHLORIDE 10 MEQ/100ML IV SOLN
INTRAVENOUS | Status: AC
  Administered 2023-01-15: 10 meq via INTRAVENOUS
  Filled 2023-01-15: qty 100

## 2023-01-15 MED ORDER — LACTATED RINGERS IV SOLN: INTRAVENOUS | Status: DC

## 2023-01-15 MED ORDER — POTASSIUM CHLORIDE 10 MEQ/100ML IV SOLN
10.0000 meq | INTRAVENOUS | Status: AC
  Administered 2023-01-15: 10 meq via INTRAVENOUS
  Filled 2023-01-15 (×2): qty 100

## 2023-01-15 MED ORDER — SODIUM CHLORIDE 0.9 % IV SOLN: 10.0000 mL/h | Freq: Once | INTRAVENOUS | Status: DC

## 2023-01-15 MED ORDER — ONDANSETRON HCL 4 MG PO TABS: 4.0000 mg | ORAL_TABLET | Freq: Four times a day (QID) | ORAL | Status: DC | PRN

## 2023-01-15 MED ORDER — NORETHIN-ETH ESTRADIOL-FE 0.4-35 MG-MCG PO CHEW: 1.0000 | CHEWABLE_TABLET | Freq: Two times a day (BID) | ORAL | Status: DC

## 2023-01-15 MED ORDER — INSULIN ASPART 100 UNIT/ML IJ SOLN
0.0000 [IU] | Freq: Three times a day (TID) | INTRAMUSCULAR | Status: DC
  Administered 2023-01-15: 3 [IU] via SUBCUTANEOUS
  Administered 2023-01-16: 8 [IU] via SUBCUTANEOUS
  Administered 2023-01-16: 5 [IU] via SUBCUTANEOUS
  Filled 2023-01-15 (×3): qty 1

## 2023-01-15 MED ORDER — DEXTROSE IN LACTATED RINGERS 5 % IV SOLN: INTRAVENOUS | Status: DC

## 2023-01-15 MED ORDER — INSULIN REGULAR(HUMAN) IN NACL 100-0.9 UT/100ML-% IV SOLN
INTRAVENOUS | Status: DC
  Administered 2023-01-15: 1.5 [IU]/h via INTRAVENOUS

## 2023-01-15 MED ORDER — METOPROLOL TARTRATE 25 MG PO TABS
12.5000 mg | ORAL_TABLET | Freq: Two times a day (BID) | ORAL | Status: DC
  Administered 2023-01-15 – 2023-01-16 (×3): 12.5 mg via ORAL
  Filled 2023-01-15 (×3): qty 1

## 2023-01-15 MED ORDER — ONDANSETRON 4 MG PO TBDP: 4.0000 mg | ORAL_TABLET | Freq: Four times a day (QID) | ORAL | Status: DC | PRN

## 2023-01-15 MED ORDER — INSULIN REGULAR(HUMAN) IN NACL 100-0.9 UT/100ML-% IV SOLN
INTRAVENOUS | Status: DC
  Administered 2023-01-15: 14 [IU]/h via INTRAVENOUS
  Filled 2023-01-15: qty 100

## 2023-01-15 MED ORDER — GABAPENTIN 300 MG PO CAPS
600.0000 mg | ORAL_CAPSULE | Freq: Every day | ORAL | Status: DC
  Administered 2023-01-15: 600 mg via ORAL
  Filled 2023-01-15: qty 2

## 2023-01-15 MED ORDER — INSULIN GLARGINE-YFGN 100 UNIT/ML ~~LOC~~ SOLN
11.0000 [IU] | Freq: Every day | SUBCUTANEOUS | Status: DC
  Administered 2023-01-15 – 2023-01-16 (×2): 11 [IU] via SUBCUTANEOUS
  Filled 2023-01-15 (×2): qty 0.11

## 2023-01-15 MED ORDER — ONDANSETRON HCL 4 MG/2ML IJ SOLN: 4.0000 mg | Freq: Four times a day (QID) | INTRAMUSCULAR | Status: DC | PRN

## 2023-01-15 MED ORDER — NORTRIPTYLINE HCL 25 MG PO CAPS
100.0000 mg | ORAL_CAPSULE | Freq: Every day | ORAL | Status: DC
  Administered 2023-01-15: 100 mg via ORAL
  Filled 2023-01-15 (×2): qty 4

## 2023-01-15 MED ORDER — NORETHIN-ETH ESTRADIOL-FE 0.4-35 MG-MCG PO CHEW
1.0000 | CHEWABLE_TABLET | Freq: Three times a day (TID) | ORAL | Status: DC
  Administered 2023-01-15 – 2023-01-16 (×2): 1 via ORAL
  Filled 2023-01-15 (×4): qty 1

## 2023-01-15 MED ORDER — NORETHIN-ETH ESTRADIOL-FE 0.4-35 MG-MCG PO CHEW: 1.0000 | CHEWABLE_TABLET | Freq: Every day | ORAL | Status: DC

## 2023-01-15 MED ORDER — LACTATED RINGERS IV BOLUS
20.0000 mL/kg | Freq: Once | INTRAVENOUS | Status: AC
  Administered 2023-01-15: 1280 mL via INTRAVENOUS

## 2023-01-15 MED ORDER — NORETHIN-ETH ESTRADIOL-FE 0.4-35 MG-MCG PO CHEW: 1.0000 | CHEWABLE_TABLET | Freq: Three times a day (TID) | ORAL | Status: DC

## 2023-01-15 MED ORDER — ENOXAPARIN SODIUM 40 MG/0.4ML IJ SOSY: 40.0000 mg | PREFILLED_SYRINGE | INTRAMUSCULAR | Status: DC

## 2023-01-15 MED ORDER — INSULIN ASPART 100 UNIT/ML IJ SOLN: 0.0000 [IU] | INTRAMUSCULAR | Status: DC

## 2023-01-15 MED ORDER — DEXTROSE 50 % IV SOLN: 0.0000 mL | INTRAVENOUS | Status: DC | PRN

## 2023-01-15 MED ORDER — INSULIN DETEMIR 100 UNIT/ML ~~LOC~~ SOLN: 10.0000 [IU] | Freq: Every day | SUBCUTANEOUS | Status: DC

## 2023-01-15 MED ORDER — INSULIN ASPART 100 UNIT/ML IJ SOLN
0.0000 [IU] | Freq: Every day | INTRAMUSCULAR | Status: DC
  Administered 2023-01-15: 2 [IU] via SUBCUTANEOUS
  Filled 2023-01-15: qty 1

## 2023-01-15 MED ORDER — INSULIN ASPART 100 UNIT/ML IJ SOLN
4.0000 [IU] | Freq: Three times a day (TID) | INTRAMUSCULAR | Status: DC
  Administered 2023-01-15 – 2023-01-16 (×3): 4 [IU] via SUBCUTANEOUS
  Filled 2023-01-15 (×2): qty 1

## 2023-01-15 NOTE — ED Triage Notes (Signed)
Pt via [facility] from work. EMS report syncopal episode, unwitnessed. Found unconsciousness momentarily. Denies head injury. Pt has a hx of DM II. EMS reports CBG of 574. She non-complaint with Metformin sometimes because it causes her nausea, pt hasn't taken it in months. Pt also report abd pain. Pt is A&Ox4 and NAD.  HR 110  SpO2 100%  BP 137/93

## 2023-01-15 NOTE — ED Notes (Signed)
Pt eating, family at bedside, pt denies current needs

## 2023-01-15 NOTE — Consult Note (Signed)
Consult History and Physical   SERVICE: Gynecology unassigned pt   Patient Name: Mary Velasquez Patient MRN:   [id number]  CC: menorrhagia  HPI: Mary Velasquez is a 42 y.o. No obstetric history on file. with  along history for menorrhagia with bleeding q 21 days and bleeds for 7-8 days . + clots .Syncopal episode at worktoday . T2 DM not in control with  glucose 362 . Last A1c =13  4 yrs ago  This cycle started 8 days ago and she went through 2 boxes of pads .  U/s today small fibroids x 2 She has not been getting Gyn care in some time . I did her 2 c/s 20 yrs ago    Review of Systems: positives in bold GEN:   fevers, chills, weight changes, appetite changes, fatigue, night sweats HEENT:  HA, vision changes, hearing loss, congestion, rhinorrhea, sinus pressure, dysphagia CV:   CP, palpitations PULM:  SOB, cough GI:  abd pain, N/V/D/C GU:  dysuria, urgency, frequency MSK:  arthralgias, myalgias, back pain, swelling SKIN:  rashes, color changes, pallor NEURO:  numbness, weakness, tingling, seizures, dizziness, tremors, + syncope PSYCH:  depression, anxiety, behavioral problems, confusion  HEME/LYMPH:  easy bruising or bleeding ENDO:  heat/cold intolerance  Past Obstetrical History: OB History   No obstetric history on file.     Past Gynecologic History: Patient's last menstrual period was 01/06/2023.  Past Medical History: Past Medical History:  Diagnosis Date   AVM (arteriovenous malformation)    Diabetes mellitus without complication (HCC)    Peripheral neuropathy     Past Surgical History:   Past Surgical History:  Procedure Laterality Date   ABDOMINAL SURGERY     BREAST SURGERY     CESAREAN SECTION     ESOPHAGOGASTRODUODENOSCOPY N/A 07/10/2018   Procedure: ESOPHAGOGASTRODUODENOSCOPY (EGD);  Surgeon: Toney Reil, MD;  Location: [facility] ENDOSCOPY;  Service: Gastroenterology;  Laterality: N/A;   TUBAL LIGATION      Family History:  family history  includes Heart attack in her cousin and maternal grandfather; Heart disease in her cousin, maternal grandfather, and paternal aunt.  Social History:  Social History   Socioeconomic History   Marital status: Single    Spouse name: Not on file   Number of children: Not on file   Years of education: Not on file   Highest education level: Not on file  Occupational History   Not on file  Tobacco Use   Smoking status: Never   Smokeless tobacco: Never  Vaping Use   Vaping status: Never Used  Substance and Sexual Activity   Alcohol use: No   Drug use: Not Currently   Sexual activity: Not on file  Other Topics Concern   Not on file  Social History Narrative   Not on file   Social Determinants of Health   Financial Resource Strain: Not on file  Food Insecurity: Not on file  Transportation Needs: Not on file  Physical Activity: Not on file  Stress: Not on file  Social Connections: Not on file  Intimate Partner Violence: Not on file    Home Medications:  Medications reconciled in EPIC  No current facility-administered medications on file prior to encounter.   Current Outpatient Medications on File Prior to Encounter  Medication Sig Dispense Refill   gabapentin (NEURONTIN) 300 MG capsule Take 600 mg by mouth at bedtime.     metoprolol tartrate (LOPRESSOR) 25 MG tablet Take 0.5 tablets (12.5 mg total) by mouth 2 (two)  times daily. 180 tablet 3   nortriptyline (PAMELOR) 50 MG capsule Take 100 mg by mouth at bedtime.     metFORMIN (GLUCOPHAGE-XR) 500 MG 24 hr tablet Take 1 tablet (500 mg total) by mouth daily with breakfast. (Patient not taking: Reported on 10/24/2022) 30 tablet 0    Allergies:  Allergies  Allergen Reactions   Oxycodone    Oxycontin [Oxycodone Hcl]     Physical Exam:  Temp:  [97.7 F (36.5 C)-98 F (36.7 C)] 98 F (36.7 C) (07/17 1745) Pulse Rate:  [95-112] 102 (07/17 1745) Resp:  [11-20] 18 (07/17 1745) BP: (125-150)/(83-105) 125/91 (07/17 1745) SpO2:   [100 %] 100 % (07/17 1600) Weight:  [64 kg] 64 kg (07/17 0821)   General Appearance:  Well developed, well nourished, no acute distress, alert and oriented x3 HEENT:  Normocephalic atraumatic, extraocular movements intact, moist mucous membranes Cardiovascular:  Normal S1/S2, regular rate and rhythm, no murmurs Pulmonary:  clear to auscultation, no wheezes, rales or rhonchi, symmetric air entry, good air exchange Abdomen:  Bowel sounds present, soft, nontender, nondistended, no abnormal masses, no epigastric pain Extremities:  Full range of motion, no pedal edema, 2+ distal pulses, no tenderness Skin:  normal coloration and turgor, no rashes, no suspicious skin lesions noted  Neurologic:  Cranial nerves 2-12 grossly intact, normal muscle tone, strength 5/5 all four extremities Psychiatric:  Normal mood and affect, appropriate, no AH/VH Pelvic: deferred   Labs/Studies:   CBC and Coags:  Lab Results  Component Value Date   WBC 6.7 01/15/2023   NEUTOPHILPCT 67 01/15/2023   EOSPCT 1 01/15/2023   BASOPCT 0 01/15/2023   LYMPHOPCT 20 01/15/2023   HGB 7.3 (L) 01/15/2023   HCT 24.7 (L) 01/15/2023   MCV 71.6 (L) 01/15/2023   PLT 328 01/15/2023   CMP:  Lab Results  Component Value Date   NA 131 (L) 01/15/2023   K 3.7 01/15/2023   CL 103 01/15/2023   CO2 23 01/15/2023   BUN 10 01/15/2023   CREATININE 0.54 01/15/2023   CREATININE 0.55 08/26/2022   CREATININE 0.65 02/20/2022   PROT 8.6 (H) 08/26/2022   BILITOT 0.5 08/26/2022   BILIDIR <0.1 07/10/2018   ALT 12 08/26/2022   AST 18 08/26/2022   ALKPHOS 153 (H) 08/26/2022    Other Imaging: US PELVIC COMPLETE W TRANSVAGINAL AND TORSION R/O  Result Date: 01/15/2023 CLINICAL DATA:  Vaginal bleeding EXAM: TRANSABDOMINAL AND TRANSVAGINAL ULTRASOUND OF PELVIS DOPPLER ULTRASOUND OF OVARIES TECHNIQUE: Both transabdominal and transvaginal ultrasound examinations of the pelvis were performed. Transabdominal technique was performed for global  imaging of the pelvis including uterus, ovaries, adnexal regions, and pelvic cul-de-sac. It was necessary to proceed with endovaginal exam following the transabdominal exam to visualize the uterus and ovaries. Color and duplex Doppler ultrasound was utilized to evaluate blood flow to the ovaries. COMPARISON:  None Available. FINDINGS: Uterus Measurements: 10.9 x 5.6 x 4.9 cm = volume: 155.2 mL. Anteverted. Intramural fibroid of the fundus measuring 1.8 x 1.8 x 2.3 cm. Intramural fibroid of the left posterior uterus measuring 1.6 x 1.5 x 2.0 cm. Endometrium Thickness: 13.3 mm.  No focal abnormality visualized. Right ovary Measurements: 3.4 x 2.2 x 3.0 cm = volume: 11.4 mL. Normal appearance/no adnexal mass. Left ovary Measurements: 3.6 x 1.6 x 1.8 cm = volume: 5.9 mL. Normal appearance/no adnexal mass. Pulsed Doppler evaluation of both ovaries demonstrates normal low-resistance arterial and venous waveforms. Other findings No abnormal free fluid. IMPRESSION: 1. No evidence of ovarian torsion.  2. Uterine fibroids. Electronically Signed   By: Allegra Lai M.D.   On: 01/15/2023 13:52   CT HEAD WO CONTRAST ( )  Result Date: 01/15/2023 CLINICAL DATA:  Syncope/presyncope, cerebrovascular cause suspected EXAM: CT HEAD WITHOUT CONTRAST TECHNIQUE: Contiguous axial images were obtained from the base of the skull through the vertex without intravenous contrast. RADIATION DOSE REDUCTION: This exam was performed according to the departmental dose-optimization program which includes automated exposure control, adjustment of the mA and/or kV according to patient size and/or use of iterative reconstruction technique. COMPARISON:  MR Head 03/29/22 FINDINGS: Brain: No evidence of acute infarction, hemorrhage, hydrocephalus, extra-axial collection or mass lesion/mass effect. Sequela of mild chronic microvascular ischemic change. Vascular: No hyperdense vessel or unexpected calcification. Skull: Normal. Negative for fracture or  focal lesion. Sinuses/Orbits: No middle ear or mastoid effusion. Paranasal sinuses notable for mucosal thickening right maxillary sinus. Orbits are unremarkable. Other: None. IMPRESSION: No acute intracranial abnormality. Electronically Signed   By: Lorenza Cambridge M.D.   On: 01/15/2023 13:48     Assessment / Plan:   Mary Velasquez is a 42 y.o. No obstetric history on file. who presents with syncopal episode secondary to anemia caused by severe menorrhagia  Smalll fibroids , possible adenomyosis  Hospitalists is admitting to control Glucose values  Blood transfusion completed .  Start OCP 1po tid x 3 day --> 1po bid --> then 1 po every day and discard sugar pills and stay on every day .  Pt can be seen by me at [facility] after d/c for embx / pap and discussion of treatment options  Please write for Necon 1/35  or equivalent as outpatient  until I see her  45 minutes in patient care    Thank you for the opportunity to be involved with this pt's care.

## 2023-01-15 NOTE — Inpatient Diabetes Management (Signed)
Inpatient Diabetes Program Recommendations  AACE/ADA: New Consensus Statement on Inpatient Glycemic Control (2015)  Target Ranges:  Prepandial:   less than 140 mg/dL      Peak postprandial:   less than 180 mg/dL (1-2 hours)      Critically ill patients:  140 - 180 mg/dL   Lab Results  Component Value Date   GLUCAP 153 (H) 01/15/2023   HGBA1C 13.3 (H) 07/09/2018   Diabetes history: DM2 Outpatient Diabetes medications: Metformin 500 mg QAM (not taking) Current orders for Inpatient glycemic control: IV insulin  Inpatient Diabetes Program Recommendations:    When MD is ready to transition to SQ insulin, please consider:  1) Semglee 12 units 2 hours prior to discontinuing IV insulin and then every day 2) Novolog 0-15 units TID and 0-5 units at bedtime 3) carb modified diet  Met with patient at bedside.  She had GDM with her pregnancy;  she states her DM never went away after pregnancy.  She [facility] up at [facility] for PCP/Endocrinology.  She has been prescribed Trulicity in the past per chart review.  She states she has taken Lantus using the insulin pen in the past.  She does not take the Metformin due to GI side effects.  She states she is ready to start taking better care of herself.    Blood glucose was > 500 when EMS arrived.  She has a glucometer but does not check her BG.  Discussed DM2 pathophysiology, importance of glucose control and long and short term complications.  She has a history of gastroparesis and peripheral neuropathy.    Will ask pharmacy for a benefit check on insulins and CGMs and will follow up with her tomorrow.  Will continue to follow while inpatient.  Thank you, Dulce Sellar, MSN, CDCES Diabetes Coordinator Inpatient Diabetes Program [phone] (team pager from 8a-5p)

## 2023-01-15 NOTE — Assessment & Plan Note (Signed)
Poorly controlled at baseline w/ blood sugars in 4-500s on presentation  No overt DKA or HHS on presentation  Started on hyperglycemia protocol in the ER  Will continue for now  Anticipate rapid transition to basal + SSI  A1C  Discussed diet and lifestyle modification with daughter at the bedside

## 2023-01-15 NOTE — Assessment & Plan Note (Signed)
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

## 2023-01-15 NOTE — Assessment & Plan Note (Signed)
Nonspecific syncopal event in the setting of symptomatic anemia Looks to be chronic issue followed on a outpatient basis by outpatient neurology Grossly nonfocal exam today CT head pending Monitor

## 2023-01-15 NOTE — TOC Benefit Eligibility Note (Signed)
Pharmacy Patient Advocate Encounter  Insurance verification completed.    The patient is insured through [employer] [facility]    Ran test claim for Lantus solostar and the current 30 day co-pay is $18.71.  Ran test claim for [place] and the current 30 day co-pay is $98.56.  Ran test claim for Toujeo and the current 30 day co-pay is $83.09.  Ran test claim for [place] and the current 30 day co-pay is $108.33.  Ran test claim for Novolog and the current 30 day co-pay is $27.11.  Ran test claim for Humalog and the current 30 day co-pay is $35.  Ran test claim for Lyumjev and the current 30 day co-pay is $35.  Ran test claims for Levemir and Apidra, not on formulary. Insurance prefers Lantus and Pharmacist, hospital.        This test claim was processed through [facility]- copay amounts may vary at other pharmacies due to pharmacy/plan contracts, or as the patient moves through the different stages of their insurance plan.

## 2023-01-15 NOTE — Assessment & Plan Note (Addendum)
Recurring heavy menstrual cycles- longstanding issue which seem to be progressively worsening w/ noted symptomatic anemia  Pending formal Gyn consult  Follow up GYN recommendations

## 2023-01-15 NOTE — ED Provider Notes (Signed)
[facility] Provider Note   Event Date/Time   First MD Initiated Contact with Patient 01/15/23 [phone]     (approximate) History  Loss of Consciousness  HPI Mary Velasquez is a 42 y.o. female with stated past medical history of menorrhagia and type 2 diabetes only on metformin who presents complaining of a syncopal event at work this morning.  Patient arrives via EMS who states patient's blood sugar to be in the 500s.  Patient states that she has taken her metformin on time and as prescribed.  Patient does endorse polydipsia/polyuria over the past few days.  Patient also endorses heavy vaginal bleeding over the last 2 days.  Patient endorses worsening orthostatic lightheadedness that culminated in a orthostatic syncopal event today. ROS: Patient currently denies any vision changes, tinnitus, difficulty speaking, facial droop, sore throat, chest pain, shortness of breath, abdominal pain, nausea/vomiting/diarrhea, dysuria, or weakness/numbness/paresthesias in any extremity   Physical Exam  Triage Vital Signs: ED Triage Vitals  Encounter Vitals Group     BP      Systolic BP Percentile      Diastolic BP Percentile      Pulse      Resp      Temp      Temp src      SpO2      Weight      Height      Head Circumference      Peak Flow      Pain Score      Pain Loc      Pain Education      Exclude from Growth Chart    Most recent vital signs: Vitals:   01/15/23 1428 01/15/23 1443  BP: (!) 140/98 (!) 150/105  Pulse: (!) 111 (!) 112  Resp: 14 16  Temp: 97.7 F (36.5 C) 97.7 F (36.5 C)  SpO2: 100% 100%   General: Awake, oriented x4. CV:  Good peripheral perfusion.  Resp:  Normal effort.  Abd:  No distention.  Other:  Middle-aged overweight African-American female laying in bed in no acute distress.  Conjunctival pallor bilaterally ED Results / Procedures / Treatments  Labs (all labs ordered are listed, but only abnormal results are displayed) Labs  Reviewed  BASIC METABOLIC PANEL - Abnormal; Notable for the following components:      Result Value   Sodium 131 (*)    Glucose, Bld 466 (*)    Calcium 7.2 (*)    All other components within normal limits  CBC WITH DIFFERENTIAL/PLATELET - Abnormal; Notable for the following components:   RBC 3.45 (*)    Hemoglobin 7.3 (*)    HCT 24.7 (*)    MCV 71.6 (*)    MCH 21.2 (*)    MCHC 29.6 (*)    RDW 15.8 (*)    All other components within normal limits  URINALYSIS, ROUTINE W REFLEX MICROSCOPIC - Abnormal; Notable for the following components:   Color, Urine STRAW (*)    APPearance CLEAR (*)    Glucose, UA >=500 (*)    Hgb urine dipstick SMALL (*)    All other components within normal limits  BLOOD GAS, VENOUS - Abnormal; Notable for the following components:   pO2, Ven <31 (*)    All other components within normal limits  CBG MONITORING, ED - Abnormal; Notable for the following components:   Glucose-Capillary 362 (*)    All other components within normal limits  CBG MONITORING, ED - Abnormal; Notable  for the following components:   Glucose-Capillary 153 (*)    All other components within normal limits  CBG MONITORING, ED - Abnormal; Notable for the following components:   Glucose-Capillary 140 (*)    All other components within normal limits  CBG MONITORING, ED - Abnormal; Notable for the following components:   Glucose-Capillary 151 (*)    All other components within normal limits  BETA-HYDROXYBUTYRIC ACID  BETA-HYDROXYBUTYRIC ACID  BASIC METABOLIC PANEL  HIV ANTIBODY (ROUTINE TESTING W REFLEX)  HEMOGLOBIN A1C  VITAMIN B12  FOLATE  IRON AND TIBC  FERRITIN  RETICULOCYTES  BETA-HYDROXYBUTYRIC ACID  POC URINE PREG, ED  PREPARE RBC (CROSSMATCH)  ABO/RH  TYPE AND SCREEN   EKG ED ECG REPORT I, Merwyn Katos, the attending physician, personally viewed and interpreted this ECG. Date: 01/15/2023 EKG Time: 0823 Rate: 109 Rhythm: Tachycardic sinus rhythm QRS Axis:  normal Intervals: normal ST/T Wave abnormalities: normal Narrative Interpretation: Tachycardic sinus rhythm.  No evidence of acute ischemia RADIOLOGY ED MD interpretation: CT of the head without contrast interpreted by me shows no evidence of acute abnormalities including no intracerebral hemorrhage, obvious masses, or significant edema -Agree with radiology assessment Official radiology report(s): US PELVIC COMPLETE W TRANSVAGINAL AND TORSION R/O  Result Date: 01/15/2023 CLINICAL DATA:  Vaginal bleeding EXAM: TRANSABDOMINAL AND TRANSVAGINAL ULTRASOUND OF PELVIS DOPPLER ULTRASOUND OF OVARIES TECHNIQUE: Both transabdominal and transvaginal ultrasound examinations of the pelvis were performed. Transabdominal technique was performed for global imaging of the pelvis including uterus, ovaries, adnexal regions, and pelvic cul-de-sac. It was necessary to proceed with endovaginal exam following the transabdominal exam to visualize the uterus and ovaries. Color and duplex Doppler ultrasound was utilized to evaluate blood flow to the ovaries. COMPARISON:  None Available. FINDINGS: Uterus Measurements: 10.9 x 5.6 x 4.9 cm = volume: 155.2 mL. Anteverted. Intramural fibroid of the fundus measuring 1.8 x 1.8 x 2.3 cm. Intramural fibroid of the left posterior uterus measuring 1.6 x 1.5 x 2.0 cm. Endometrium Thickness: 13.3 mm.  No focal abnormality visualized. Right ovary Measurements: 3.4 x 2.2 x 3.0 cm = volume: 11.4 mL. Normal appearance/no adnexal mass. Left ovary Measurements: 3.6 x 1.6 x 1.8 cm = volume: 5.9 mL. Normal appearance/no adnexal mass. Pulsed Doppler evaluation of both ovaries demonstrates normal low-resistance arterial and venous waveforms. Other findings No abnormal free fluid. IMPRESSION: 1. No evidence of ovarian torsion. 2. Uterine fibroids. Electronically Signed   By: Allegra Lai M.D.   On: 01/15/2023 13:52   CT HEAD WO CONTRAST ( )  Result Date: 01/15/2023 CLINICAL DATA:   Syncope/presyncope, cerebrovascular cause suspected EXAM: CT HEAD WITHOUT CONTRAST TECHNIQUE: Contiguous axial images were obtained from the base of the skull through the vertex without intravenous contrast. RADIATION DOSE REDUCTION: This exam was performed according to the departmental dose-optimization program which includes automated exposure control, adjustment of the mA and/or kV according to patient size and/or use of iterative reconstruction technique. COMPARISON:  MR Head 03/29/22 FINDINGS: Brain: No evidence of acute infarction, hemorrhage, hydrocephalus, extra-axial collection or mass lesion/mass effect. Sequela of mild chronic microvascular ischemic change. Vascular: No hyperdense vessel or unexpected calcification. Skull: Normal. Negative for fracture or focal lesion. Sinuses/Orbits: No middle ear or mastoid effusion. Paranasal sinuses notable for mucosal thickening right maxillary sinus. Orbits are unremarkable. Other: None. IMPRESSION: No acute intracranial abnormality. Electronically Signed   By: Lorenza Cambridge M.D.   On: 01/15/2023 13:48   PROCEDURES: Critical Care performed: No .1-3 Lead EKG Interpretation  Performed by: Merwyn Katos,  MD Authorized by: Merwyn Katos, MD     Interpretation: abnormal     ECG rate:  111   ECG rate assessment: tachycardic     Rhythm: sinus tachycardia     Ectopy: none     Conduction: normal   CRITICAL CARE Performed by: Merwyn Katos  Total critical care time: 35 minutes  Critical care time was exclusive of separately billable procedures and treating other patients.  Critical care was necessary to treat or prevent imminent or life-threatening deterioration.  Critical care was time spent personally by me on the following activities: development of treatment plan with patient and/or surrogate as well as nursing, discussions with consultants, evaluation of patient's response to treatment, examination of patient, obtaining history from patient or  surrogate, ordering and performing treatments and interventions, ordering and review of laboratory studies, ordering and review of radiographic studies, pulse oximetry and re-evaluation of patient's condition.  MEDICATIONS ORDERED IN ED: Medications  0.9 %  sodium chloride infusion (has no administration in time range)  lactated ringers infusion (0 mLs Intravenous Stopped 01/15/23 1407)  dextrose 5 % in lactated ringers infusion (has no administration in time range)  potassium chloride 10 mEq in 100 mL IVPB (0 mEq Intravenous Stopped 01/15/23 1309)  ondansetron (ZOFRAN) tablet 4 mg (has no administration in time range)    Or  ondansetron (ZOFRAN) injection 4 mg (has no administration in time range)  enoxaparin (LOVENOX) injection 40 mg (40 mg Subcutaneous Given 01/15/23 1542)  insulin regular, human (MYXREDLIN) 100 units/ 100 mL infusion (2.8 Units/hr Intravenous Infusion Verify 01/15/23 1536)  lactated ringers infusion (has no administration in time range)  dextrose 5 % in lactated ringers infusion ( Intravenous New Bag/Given 01/15/23 1307)  dextrose 50 % solution 0-50 mL (has no administration in time range)  metoprolol tartrate (LOPRESSOR) tablet 12.5 mg (12.5 mg Oral Given 01/15/23 1413)  nortriptyline (PAMELOR) capsule 100 mg (has no administration in time range)  gabapentin (NEURONTIN) capsule 600 mg (has no administration in time range)  lactated ringers bolus 1,280 mL (0 mLs Intravenous Stopped 01/15/23 0923)   IMPRESSION / MDM / ASSESSMENT AND PLAN / ED COURSE  I reviewed the triage vital signs and the nursing notes.                             The patient is on the cardiac monitor to evaluate for evidence of arrhythmia and/or significant heart rate changes. Patient's presentation is most consistent with acute presentation with potential threat to life or bodily function. Patient presents with complaints of syncope/presyncope ED Workup:  CBC, BMP, Troponin, BNP, ECG, CXR Differential  diagnosis includes HF, ICH, seizure, stroke, HOCM, ACS, aortic dissection, malignant arrhythmia, or GI bleed. Findings: No evidence of acute laboratory abnormalities.  Troponin negative x1 EKG: No e/o STEMI. No evidence of Brugadas sign, delta wave, epsilon wave, significantly prolonged QTc, or malignant arrhythmia. Patient showing signs of symptomatic anemia with significant decrease in hemoglobin from 11-7 over the past few months.  Will replete with 1 unit packed red blood cells.  I spoke with Dr. Feliberto Gottron in OB/GYN who recommends an ultrasound prior to starting any empiric OCPs for menorrhagia.  I spoke with Dr. Alvester Morin on the below service who agrees to accept this patient onto his service for further evaluation and management Disposition: Admit to medicine, telemetry bed for cardiac monitoring and cardiology review.   FINAL CLINICAL IMPRESSION(S) / ED DIAGNOSES  Final diagnoses:  Syncope and collapse  Symptomatic anemia  Menorrhagia with regular cycle   Rx / DC Orders   ED Discharge Orders     None      Note:  This document was prepared using Dragon voice recognition software and may include unintentional dictation errors.   Merwyn Katos, MD 01/15/23 [phone]

## 2023-01-15 NOTE — Assessment & Plan Note (Signed)
Noted worsening weakness w/ syncopal event in setting of recurring menorrhagia  Hgb 7.3 today with baseline hemoglobin around 11-12 Pending PRBC transfusion Check anemia panel Patient denies any grossly black/bloody stools Pending formal GYN consult in the setting of menorrhagia Follow-up

## 2023-01-15 NOTE — Assessment & Plan Note (Signed)
Followed by outpt cardiology  Cont home metoprolol

## 2023-01-15 NOTE — ED Notes (Signed)
Pt at ultrasound

## 2023-01-15 NOTE — ED Notes (Signed)
Hospitalist messaged due to pt blood sugars being below 160 for 3 times

## 2023-01-15 NOTE — H&P (Signed)
History and Physical    Patient: Mary Velasquez MVH:[id number] DOB: Nov 18, 1980 DOA: 01/15/2023 DOS: the patient was seen and examined on 01/15/2023 PCP: Patient, No Pcp Per  Patient coming from: Home  Chief Complaint:  Chief Complaint  Patient presents with   Loss of Consciousness   HPI: Mary Velasquez is a 42 y.o. female with medical history significant of poorly controlled type 2 diabetes, chronic sinus tachycardia, diabetic peripheral neuropathy, history of recurrent syncope, history of AVM presenting with symptomatic anemia, menorrhagia, syncope.  Patient with noted syncopal episode earlier today.  Had generalized weakness.  Was found unconscious.  No reported hemiparesis or confusion prior to episode.  Baseline history of recurrent episodes of syncope in the past followed by outpatient [facility] neurology.  Patient also reports having heavy menstrual cycles.  Has used doubled her amount of pads over her last menstrual cycle.  This has been a recurrent chronic issue that she has not seen a gynecologist for.  Worsening weakness and fatigue associated with menstrual cycle.  No chest pain or shortness of breath.  Mild chronic abdominal pain.  Baseline diabetes.  She has not been checking her blood sugars on a regular basis.  Patient also admits to very poor diet. Presented to the ER afebrile, heart rate 100s, BP stable.  Satting well on room air.  Noted hemoglobin of 7.3.  Blood sugar 4-500s.  Bicarb within normal limits.  Urinalysis without ketones.  BHB within normal limits.  Pelvic ultrasound and CT head pending Review of Systems: As mentioned in the history of present illness. All other systems reviewed and are negative. Past Medical History:  Diagnosis Date   AVM (arteriovenous malformation)    Diabetes mellitus without complication (HCC)    Peripheral neuropathy    Past Surgical History:  Procedure Laterality Date   ABDOMINAL SURGERY     BREAST SURGERY     CESAREAN SECTION      ESOPHAGOGASTRODUODENOSCOPY N/A 07/10/2018   Procedure: ESOPHAGOGASTRODUODENOSCOPY (EGD);  Surgeon: Toney Reil, MD;  Location: [facility] ENDOSCOPY;  Service: Gastroenterology;  Laterality: N/A;   TUBAL LIGATION     Social History:  reports that she has never smoked. She has never used smokeless tobacco. She reports that she does not currently use drugs. She reports that she does not drink alcohol.  Allergies  Allergen Reactions   Oxycodone    Oxycontin [Oxycodone Hcl]     Family History  Problem Relation Age of Onset   Heart disease Paternal Aunt    Heart attack Maternal Grandfather    Heart disease Maternal Grandfather    Heart attack Cousin    Heart disease Cousin    CAD Neg Hx    Seizures Neg Hx     Prior to Admission medications   Medication Sig Start Date End Date Taking? Authorizing Provider  gabapentin (NEURONTIN) 300 MG capsule Take 600 mg by mouth at bedtime. 08/13/21   [provider]  metFORMIN (GLUCOPHAGE-XR) 500 MG 24 hr tablet Take 1 tablet (500 mg total) by mouth daily with breakfast. Patient not taking: Reported on 10/24/2022 08/26/22   Ward, Layla Maw, DO  metoprolol tartrate (LOPRESSOR) 25 MG tablet Take 0.5 tablets (12.5 mg total) by mouth 2 (two) times daily. 10/25/22 01/23/23  Debbe Odea, MD  nortriptyline (PAMELOR) 50 MG capsule Take 100 mg by mouth at bedtime. 08/13/21   [provider]    Physical Exam: Vitals:   01/15/23 0823 01/15/23 0900 01/15/23 0930 01/15/23 1000  BP: (!) 131/95  128/83 (!) 135/93 (!) 138/90  Pulse: (!) 108 (!) 110 (!) 107 (!) 107  Resp: 13 18 20 11   Temp: 97.8 F (36.6 C)     TempSrc: Oral     SpO2: 100% 100% 100% 100%  Weight:      Height:       Physical Exam Constitutional:      Appearance: She is obese.  HENT:     Head: Normocephalic and atraumatic.     Nose: Nose normal.     Mouth/Throat:     Mouth: Mucous membranes are moist.  Eyes:     Pupils: Pupils are equal, round, and reactive to light.   Cardiovascular:     Rate and Rhythm: Normal rate and regular rhythm.  Pulmonary:     Effort: Pulmonary effort is normal.  Abdominal:     General: Bowel sounds are normal.  Musculoskeletal:        General: Normal range of motion.  Skin:    General: Skin is warm.  Neurological:     General: No focal deficit present.  Psychiatric:        Mood and Affect: Mood normal.     Data Reviewed:  There are no new results to review at this time. Lab Results  Component Value Date   WBC 6.7 01/15/2023   HGB 7.3 (L) 01/15/2023   HCT 24.7 (L) 01/15/2023   MCV 71.6 (L) 01/15/2023   PLT 328 01/15/2023   Last metabolic panel Lab Results  Component Value Date   GLUCOSE 466 (H) 01/15/2023   NA 131 (L) 01/15/2023   K 3.7 01/15/2023   CL 103 01/15/2023   CO2 23 01/15/2023   BUN 10 01/15/2023   CREATININE 0.54 01/15/2023   GFRNONAA >60 01/15/2023   CALCIUM 7.2 (L) 01/15/2023   PHOS 3.0 07/10/2018   PROT 8.6 (H) 08/26/2022   ALBUMIN 3.2 (L) 08/26/2022   BILITOT 0.5 08/26/2022   ALKPHOS 153 (H) 08/26/2022   AST 18 08/26/2022   ALT 12 08/26/2022   ANIONGAP 5 01/15/2023    Assessment and Plan: * Symptomatic anemia Noted worsening weakness w/ syncopal event in setting of recurring menorrhagia  Hgb 7.3 today with baseline hemoglobin around 11-12 Pending PRBC transfusion Check anemia panel Patient denies any grossly black/bloody stools Pending formal GYN consult in the setting of menorrhagia Follow-up  Menorrhagia Recurring heavy menstrual cycles- longstanding issue which seem to be progressively worsening w/ noted symptomatic anemia  Pending formal Gyn consult  Follow up GYN recommendations    DM (diabetes mellitus), type 2 with neurological complications (HCC) Poorly controlled at baseline w/ blood sugars in 4-500s on presentation  No overt DKA or HHS on presentation  Started on hyperglycemia protocol in the ER  Will continue for now  Anticipate rapid transition to basal +  SSI  A1C  Discussed diet and lifestyle modification with daughter at the bedside    Sinus tachycardia Followed by outpt cardiology  Cont home metoprolol    Syncope Nonspecific syncopal event in the setting of symptomatic anemia Looks to be chronic issue followed on a outpatient basis by outpatient neurology Grossly nonfocal exam today CT head pending Monitor  Diabetic polyneuropathy (HCC) Cont home pamelor and neurontin        Advance Care Planning:   Code Status: Full Code   Consults: Pending Gyn evaluation per discussion w/ Dr. Vicente Males   Family Communication: Daughter at the bedside   Severity of Illness: The appropriate patient status for this patient  is OBSERVATION. Observation status is judged to be reasonable and necessary in order to provide the required intensity of service to ensure the patient's safety. The patient's presenting symptoms, physical exam findings, and initial radiographic and laboratory data in the context of their medical condition is felt to place them at decreased risk for further clinical deterioration. Furthermore, it is anticipated that the patient will be medically stable for discharge from the hospital within 2 midnights of admission.   Author: Floydene Flock, MD 01/15/2023 10:46 AM  For on call review [email or web address].

## 2023-01-15 NOTE — Assessment & Plan Note (Signed)
Cont home pamelor and neurontin

## 2023-01-16 DIAGNOSIS — D649 Anemia, unspecified: Secondary | ICD-10-CM | POA: Diagnosis not present

## 2023-01-16 LAB — CBC
HCT: 27.2 % — ABNORMAL LOW (ref 36.0–46.0)
Hemoglobin: 8.2 g/dL — ABNORMAL LOW (ref 12.0–15.0)
MCH: 22.1 pg — ABNORMAL LOW (ref 26.0–34.0)
MCHC: 30.1 g/dL (ref 30.0–36.0)
MCV: 73.3 fL — ABNORMAL LOW (ref 80.0–100.0)
Platelets: 314 10*3/uL (ref 150–400)
RBC: 3.71 MIL/uL — ABNORMAL LOW (ref 3.87–5.11)
RDW: 15.9 % — ABNORMAL HIGH (ref 11.5–15.5)
WBC: 7.9 10*3/uL (ref 4.0–10.5)
nRBC: 0 % (ref 0.0–0.2)

## 2023-01-16 LAB — GLUCOSE, CAPILLARY
Glucose-Capillary: 109 mg/dL — ABNORMAL HIGH (ref 70–99)
Glucose-Capillary: 250 mg/dL — ABNORMAL HIGH (ref 70–99)
Glucose-Capillary: 232 mg/dL — ABNORMAL HIGH (ref 70–99)
Glucose-Capillary: 278 mg/dL — ABNORMAL HIGH (ref 70–99)
Glucose-Capillary: 287 mg/dL — ABNORMAL HIGH (ref 70–99)

## 2023-01-16 LAB — COMPREHENSIVE METABOLIC PANEL
ALT: 18 U/L (ref 0–44)
AST: 33 U/L (ref 15–41)
Albumin: 2.2 g/dL — ABNORMAL LOW (ref 3.5–5.0)
Alkaline Phosphatase: 118 U/L (ref 38–126)
Anion gap: 5 (ref 5–15)
BUN: 10 mg/dL (ref 6–20)
CO2: 27 mmol/L (ref 22–32)
Calcium: 8.1 mg/dL — ABNORMAL LOW (ref 8.9–10.3)
Chloride: 104 mmol/L (ref 98–111)
Creatinine, Ser: 0.48 mg/dL (ref 0.44–1.00)
GFR, Estimated: >60 mL/min (ref >60)
Glucose, Bld: 165 mg/dL — ABNORMAL HIGH (ref 70–99)
Potassium: 3.6 mmol/L (ref 3.5–5.1)
Sodium: 136 mmol/L (ref 135–145)
Total Bilirubin: 0.4 mg/dL (ref 0.3–1.2)
Total Protein: 6.8 g/dL (ref 6.5–8.1)

## 2023-01-16 LAB — TYPE AND SCREEN
ABO/RH(D): B POS
Antibody Screen: NEGATIVE
Unit division: 0

## 2023-01-16 LAB — BPAM RBC
Blood Product Expiration Date: 202408082359
ISSUE DATE / TIME: 202407171422
Unit Type and Rh: 7300

## 2023-01-16 LAB — BETA-HYDROXYBUTYRIC ACID: Beta-Hydroxybutyric Acid: 0.08 mmol/L (ref 0.05–0.27)

## 2023-01-16 MED ORDER — LANCET DEVICE MISC: 1.0000 | Freq: Three times a day (TID) | 0 refills | Status: AC

## 2023-01-16 MED ORDER — BLOOD GLUCOSE TEST VI STRP: 1.0000 | ORAL_STRIP | Freq: Three times a day (TID) | 0 refills | Status: AC

## 2023-01-16 MED ORDER — INSULIN GLARGINE 100 UNIT/ML SOLOSTAR PEN: 14.0000 [IU] | PEN_INJECTOR | Freq: Every day | SUBCUTANEOUS | 2 refills | Status: AC

## 2023-01-16 MED ORDER — MUPIROCIN CALCIUM 2 % EX CREA
TOPICAL_CREAM | Freq: Every day | CUTANEOUS | Status: DC
  Filled 2023-01-16: qty 15

## 2023-01-16 MED ORDER — PEN NEEDLES 31G X 5 MM MISC: 1.0000 | Freq: Three times a day (TID) | 0 refills | Status: AC

## 2023-01-16 MED ORDER — MUPIROCIN 2 % EX OINT
TOPICAL_OINTMENT | Freq: Every day | CUTANEOUS | Status: DC
  Filled 2023-01-16: qty 22

## 2023-01-16 MED ORDER — MUPIROCIN 2 % EX OINT: TOPICAL_OINTMENT | Freq: Every day | CUTANEOUS | 0 refills | Status: DC

## 2023-01-16 MED ORDER — BLOOD GLUCOSE MONITORING SUPPL DEVI: 1.0000 | Freq: Three times a day (TID) | 0 refills | Status: AC

## 2023-01-16 MED ORDER — NORETHINDRONE-ETH ESTRADIOL 1-35 MG-MCG PO TABS: ORAL_TABLET | ORAL | 2 refills | Status: DC

## 2023-01-16 MED ORDER — INSULIN ASPART 100 UNIT/ML FLEXPEN: 4.0000 [IU] | PEN_INJECTOR | Freq: Three times a day (TID) | SUBCUTANEOUS | 2 refills | Status: AC

## 2023-01-16 MED ORDER — LANCETS MISC: 1.0000 | Freq: Three times a day (TID) | 0 refills | Status: AC

## 2023-01-16 MED ORDER — ORAL CARE MOUTH RINSE: 15.0000 mL | OROMUCOSAL | Status: DC | PRN

## 2023-01-16 NOTE — Plan of Care (Signed)
  Problem: Education: Goal: Knowledge of General Education information will improve Description: Including pain rating scale, medication(s)/side effects and non-pharmacologic comfort measures Outcome: Progressing   Problem: Clinical Measurements: Goal: Will remain free from infection Outcome: Progressing   Problem: Clinical Measurements: Goal: Respiratory complications will improve Outcome: Progressing   Problem: Clinical Measurements: Goal: Cardiovascular complication will be avoided Outcome: Progressing   Problem: Activity: Goal: Risk for activity intolerance will decrease Outcome: Progressing   Problem: Nutrition: Goal: Adequate nutrition will be maintained Outcome: Progressing   Problem: Pain Managment: Goal: General experience of comfort will improve Outcome: Progressing   Problem: Safety: Goal: Ability to remain free from injury will improve Outcome: Progressing

## 2023-01-16 NOTE — Discharge Summary (Signed)
Physician Discharge Summary   Mary Velasquez  female DOB: [birth year]  WUJ:[id number]  PCP: Patient, No Pcp Per  Admit date: 01/15/2023 Discharge date: 01/16/2023  Admitted From: home Disposition:  home CODE STATUS: Full code  Discharge Instructions     Diet Carb Modified   Complete by: As directed    Discharge wound care:   Complete by: As directed    Apply Bactroban to abdomen and right posterior thigh and leg wounds every day, then cover with foam dressings.  Change foam dressings every 3 days or as needed when soiling  Recommend outpatient dermatology followup. [facility] Course:  For full details, please see H&P, progress notes, consult notes and ancillary notes.  Briefly,  Mary Velasquez is a 42 y.o. female with medical history significant of poorly controlled type 2 diabetes, chronic sinus tachycardia, diabetic peripheral neuropathy, history of recurrent syncope, history of AVM presenting with symptomatic anemia, menorrhagia, syncope.    Patient with noted syncopal episode earlier.  No reported hemiparesis or confusion prior to episode.  Baseline history of recurrent episodes of syncope in the past followed by outpatient [facility] neurology.  Patient also reports having heavy menstrual cycles.  Has used doubled her amount of pads over her last menstrual cycle.  This has been a recurrent chronic issue that she has not seen a gynecologist for.    * Symptomatic anemia 2/2 menorrhagia  Hgb 7.3 on presentation with baseline hemoglobin around 11-12.  Pt received 1u pRBC. --Gyn consulted and rec norethindrone-ethinyl estradiol (see below for dosing) and outpatient f/u.    DM (diabetes mellitus), type 2  A1C 15.4, poorly controlled --Pt was not taking metformin PTA.  Discharged on home insulin regimen as below.  Further adjustment per outpatient provider.   Sinus tachycardia Followed by outpt cardiology  Cont home metoprolol    Syncope Nonspecific syncopal event in  the setting of symptomatic anemia Looks to be chronic issue followed on a outpatient basis by outpatient neurology CT head wnl.   Diabetic polyneuropathy (HCC) Cont home pamelor and neurontin   multiple wounds of unclear etiology --located abd, posterior right leg, posterior right thigh. Pt states these begin as a dark raised bump, she scratches it and they evolved into full thickness wounds.  --wound RN consulted, rec wound care as above, and also outpatient dermatology consult.   Discharge Diagnoses:  Principal Problem:   Symptomatic anemia Active Problems:   DM (diabetes mellitus), type 2 with neurological complications (HCC)   Menorrhagia   Diabetic polyneuropathy (HCC)   Syncope   Sinus tachycardia   History of arteriovenous malformation (AVM)     Discharge Instructions:  Allergies as of 01/16/2023       Reactions   Oxycodone    Oxycontin [oxycodone Hcl]         Medication List     STOP taking these medications    metFORMIN 500 MG 24 hr tablet Commonly known as: GLUCOPHAGE-XR       TAKE these medications    Blood Glucose Monitoring Suppl Devi 1 each by Does not apply route 3 (three) times daily. May dispense any manufacturer covered by patient's insurance.   BLOOD GLUCOSE TEST STRIPS Strp 1 each by Does not apply route 3 (three) times daily. Use as directed to check blood sugar. May dispense any manufacturer covered by patient's insurance and fits patient's device.   gabapentin 300 MG capsule Commonly known as: NEURONTIN Take 600 mg by mouth  at bedtime.   insulin aspart 100 UNIT/ML FlexPen Commonly known as: NOVOLOG Inject 4 Units into the skin 3 (three) times daily with meals. Only take if eating a meal AND Blood Glucose (BG) is 80 or higher.   insulin glargine 100 UNIT/ML Solostar Pen Commonly known as: LANTUS Inject 14 Units into the skin at bedtime. May substitute as needed per insurance.   Lancet Device Misc 1 each by Does not apply route  3 (three) times daily. May dispense any manufacturer covered by patient's insurance.   Lancets Misc 1 each by Does not apply route 3 (three) times daily. Use as directed to check blood sugar. May dispense any manufacturer covered by patient's insurance and fits patient's device.   metoprolol tartrate 25 MG tablet Commonly known as: LOPRESSOR Take 0.5 tablets (12.5 mg total) by mouth 2 (two) times daily.   mupirocin ointment 2 % Commonly known as: BACTROBAN Apply topically daily. Start taking on: January 17, 2023   norethindrone-ethinyl estradiol 1/35 tablet Commonly known as: ORTHO-NOVUM Take 1 pill 3 times per day for 3 days, then 1 pill 2 times per day for 2 days, then 1 pill daily until you see your outpatient gynecologist.   nortriptyline 50 MG capsule Commonly known as: PAMELOR Take 100 mg by mouth at bedtime.   Pen Needles 31G X 5 MM Misc 1 each by Does not apply route 3 (three) times daily. May dispense any manufacturer covered by patient's insurance.               Discharge Care Instructions  (From admission, onward)           Start     Ordered   01/16/23 0000  Discharge wound care:       Comments: Apply Bactroban to abdomen and right posterior thigh and leg wounds every day, then cover with foam dressings.  Change foam dressings every 3 days or as needed when soiling  Recommend outpatient dermatology followup. - -   01/16/23 1336             Follow-up Information     Your primary care provider Follow up in 1 week(s).          Schermerhorn, Ihor Austin, MD Follow up in 1 week(s).   Specialty: Obstetrics and Gynecology Contact information: [address] [place] [address] [phone]                 Allergies  Allergen Reactions   Oxycodone    Oxycontin [Oxycodone Hcl]      The results of significant diagnostics from this hospitalization (including imaging, microbiology, ancillary and  laboratory) are listed below for reference.   Consultations:   Procedures/Studies: US PELVIC COMPLETE W TRANSVAGINAL AND TORSION R/O  Result Date: 01/15/2023 CLINICAL DATA:  Vaginal bleeding EXAM: TRANSABDOMINAL AND TRANSVAGINAL ULTRASOUND OF PELVIS DOPPLER ULTRASOUND OF OVARIES TECHNIQUE: Both transabdominal and transvaginal ultrasound examinations of the pelvis were performed. Transabdominal technique was performed for global imaging of the pelvis including uterus, ovaries, adnexal regions, and pelvic cul-de-sac. It was necessary to proceed with endovaginal exam following the transabdominal exam to visualize the uterus and ovaries. Color and duplex Doppler ultrasound was utilized to evaluate blood flow to the ovaries. COMPARISON:  None Available. FINDINGS: Uterus Measurements: 10.9 x 5.6 x 4.9 cm = volume: 155.2 mL. Anteverted. Intramural fibroid of the fundus measuring 1.8 x 1.8 x 2.3 cm. Intramural fibroid of the left posterior uterus measuring 1.6 x 1.5 x  2.0 cm. Endometrium Thickness: 13.3 mm.  No focal abnormality visualized. Right ovary Measurements: 3.4 x 2.2 x 3.0 cm = volume: 11.4 mL. Normal appearance/no adnexal mass. Left ovary Measurements: 3.6 x 1.6 x 1.8 cm = volume: 5.9 mL. Normal appearance/no adnexal mass. Pulsed Doppler evaluation of both ovaries demonstrates normal low-resistance arterial and venous waveforms. Other findings No abnormal free fluid. IMPRESSION: 1. No evidence of ovarian torsion. 2. Uterine fibroids. Electronically Signed   By: Allegra Careen Mauch M.D.   On: 01/15/2023 13:52   CT HEAD WO CONTRAST ( )  Result Date: 01/15/2023 CLINICAL DATA:  Syncope/presyncope, cerebrovascular cause suspected EXAM: CT HEAD WITHOUT CONTRAST TECHNIQUE: Contiguous axial images were obtained from the base of the skull through the vertex without intravenous contrast. RADIATION DOSE REDUCTION: This exam was performed according to the departmental dose-optimization program which includes  automated exposure control, adjustment of the mA and/or kV according to patient size and/or use of iterative reconstruction technique. COMPARISON:  MR Head 03/29/22 FINDINGS: Brain: No evidence of acute infarction, hemorrhage, hydrocephalus, extra-axial collection or mass lesion/mass effect. Sequela of mild chronic microvascular ischemic change. Vascular: No hyperdense vessel or unexpected calcification. Skull: Normal. Negative for fracture or focal lesion. Sinuses/Orbits: No middle ear or mastoid effusion. Paranasal sinuses notable for mucosal thickening right maxillary sinus. Orbits are unremarkable. Other: None. IMPRESSION: No acute intracranial abnormality. Electronically Signed   By: Lorenza Cambridge M.D.   On: 01/15/2023 13:48      Labs: BNP (last 3 results) No results for input(s): "BNP" in the last 8760 hours. Basic Metabolic Panel: Recent Labs  Lab 01/15/23 0826 01/16/23 0301  NA 131* 136  K 3.7 3.6  CL 103 104  CO2 23 27  GLUCOSE 466* 165*  BUN 10 10  CREATININE 0.54 0.48  CALCIUM 7.2* 8.1*   Liver Function Tests: Recent Labs  Lab 01/16/23 0301  AST 33  ALT 18  ALKPHOS 118  BILITOT 0.4  PROT 6.8  ALBUMIN 2.2*   No results for input(s): "LIPASE", "AMYLASE" in the last 168 hours. No results for input(s): "AMMONIA" in the last 168 hours. CBC: Recent Labs  Lab 01/15/23 0826 01/15/23 2257 01/16/23 0301  WBC 6.7  --  7.9  NEUTROABS 4.5  --   --   HGB 7.3* 9.1* 8.2*  HCT 24.7* 29.2* 27.2*  MCV 71.6*  --  73.3*  PLT 328  --  314   Cardiac Enzymes: No results for input(s): "CKTOTAL", "CKMB", "CKMBINDEX", "TROPONINI" in the last 168 hours. BNP: Invalid input(s): "POCBNP" CBG: Recent Labs  Lab 01/15/23 2236 01/16/23 0149 01/16/23 0630 01/16/23 0746 01/16/23 1142  GLUCAP 206* 109* 250* 232* 278*   D-Dimer No results for input(s): "DDIMER" in the last 72 hours. Hgb A1c No results for input(s): "HGBA1C" in the last 72 hours. Lipid Profile No results for  input(s): "CHOL", "HDL", "LDLCALC", "TRIG", "CHOLHDL", "LDLDIRECT" in the last 72 hours. Thyroid function studies No results for input(s): "TSH", "T4TOTAL", "T3FREE", "THYROIDAB" in the last 72 hours.  Invalid input(s): "FREET3" Anemia work up Recent Labs    01/15/23 1746 01/15/23 2257  VITAMINB12 228  --   FOLATE 14.0  --   FERRITIN 3*  --   TIBC 421  --   IRON 143  --   RETICCTPCT  --  1.9   Urinalysis    Component Value Date/Time   COLORURINE STRAW (A) 01/15/2023 0826   APPEARANCEUR CLEAR (A) 01/15/2023 0826   APPEARANCEUR Cloudy 03/22/2014 0745   LABSPEC 1.030 01/15/2023  0826   LABSPEC 1.025 03/22/2014 0745   PHURINE 6.0 01/15/2023 0826   GLUCOSEU >=500 (A) 01/15/2023 0826   GLUCOSEU 100 mg/dL 29/51/8841 6606   HGBUR SMALL (A) 01/15/2023 0826   BILIRUBINUR NEGATIVE 01/15/2023 0826   BILIRUBINUR Negative 03/22/2014 0745   KETONESUR NEGATIVE 01/15/2023 0826   PROTEINUR NEGATIVE 01/15/2023 0826   NITRITE NEGATIVE 01/15/2023 0826   LEUKOCYTESUR NEGATIVE 01/15/2023 0826   LEUKOCYTESUR Negative 03/22/2014 0745   Sepsis Labs Recent Labs  Lab 01/15/23 0826 01/16/23 0301  WBC 6.7 7.9   Microbiology No results found for this or any previous visit (from the past 240 hour(s)).   Total time spend on discharging this patient, including the last patient exam, discussing the hospital stay, instructions for ongoing care as it relates to all pertinent caregivers, as well as preparing the medical discharge records, prescriptions, and/or referrals as applicable, is 40 minutes.    Darlin Priestly, MD  [facility] 01/16/2023, 1:38 PM

## 2023-01-16 NOTE — Consult Note (Addendum)
WOC Nurse Consult Note: Reason for Consult: Consult requested for multiple wounds of unknown origin; abd, posterior right leg, posterior right thigh.  Pt states these begin as a dark raised bump, she scratches it and they evolved into full thickness wounds.  Encouraged patient to obtain a dermatology consult after discharge for a possible biopsy and treatment plan. Appearance is consistent with possible Hydradenitis Suppurativa. Dermatology is not available to inpatients at [facility]. Wound type: Full thickness wounds are 90% red, 10% yellow, surrounded by darker-colored skin, small amt yellow drainage, no odor or fluctuance.  Abd: 8X4X.2cm Posterior right thigh: 5X2X.2cm and 6X4.5X.2cm Posterior right calf/knee: 6X8X.2cm and 18X4.5X.2cm, separated by narrow bridge of intact skin. Dressing procedure/placement/frequency: Topical treatment orders provided for bedside nurses to perform as follows to promote moist healing: Apply Bactroban to abd and right posterior thigh and leg wounds Q day, then cover with foam dressings.  Change foam dressings Q 3 days or PRN soiling. Please re-consult if further assistance is needed.  Thank-you,  Cammie Mcgee MSN, RN, CWOCN, [place], CNS [phone]

## 2023-01-16 NOTE — Inpatient Diabetes Management (Signed)
Inpatient Diabetes Program Recommendations  AACE/ADA: New Consensus Statement on Inpatient Glycemic Control (2015)  Target Ranges:  Prepandial:   less than 140 mg/dL      Peak postprandial:   less than 180 mg/dL (1-2 hours)      Critically ill patients:  140 - 180 mg/dL   Lab Results  Component Value Date   GLUCAP 232 (H) 01/16/2023   HGBA1C 13.3 (H) 07/09/2018    Latest Reference Range & Units 01/15/23 17:10 01/15/23 18:41 01/15/23 20:41 01/15/23 22:36 01/16/23 01:49 01/16/23 06:30 01/16/23 07:46  Glucose-Capillary 70 - 99 mg/dL 161 (H) 096 (H) 045 (H) 206 (H) 109 (H) 250 (H) 232 (H)  (H): Data is abnormally high   Discharge Recommendations: Other recommendations: F/U with PCP in 1-2 weeks.  Call PCP for glucose consistently < 100 mg/dL or >409 mg/dL. Long acting recommendations: Insulin Glargine (LANTUS) Solostar Pen 14 units at bedtime  Short acting recommendations:  Meal coverage ONLY Insulin aspart (NOVOLOG) FlexPen  4 units TID with meals   Supply/Referral recommendations: Glucometer Test strips Lancet device Lancets Pen needles - standard    Will continue to follow while inpatient.  Thank you, Dulce Sellar, MSN, CDCES Diabetes Coordinator Inpatient Diabetes Program [phone] (team pager from 8a-5p)

## 2023-01-17 LAB — HEMOGLOBIN A1C
Hgb A1c MFr Bld: 15.4 % — ABNORMAL HIGH (ref 4.8–5.6)
Mean Plasma Glucose: 395 mg/dL

## 2023-01-21 ENCOUNTER — Other Ambulatory Visit: Payer: Self-pay | Admitting: Obstetrics and Gynecology

## 2023-01-21 DIAGNOSIS — D500 Iron deficiency anemia secondary to blood loss (chronic): Secondary | ICD-10-CM

## 2023-01-21 NOTE — Progress Notes (Signed)
Venofer 300 mg Iv  weekly x 3 . Anemia secondary to severe menorrhagia

## 2023-01-24 ENCOUNTER — Ambulatory Visit: Admission: RE | Admit: 2023-01-24 | Payer: 59 | Source: Ambulatory Visit

## 2023-02-06 ENCOUNTER — Ambulatory Visit
Admission: RE | Admit: 2023-02-06 | Discharge: 2023-02-06 | Disposition: A | Discharge status: Home / Self Care | Payer: 59 | Source: Ambulatory Visit | Attending: Obstetrics and Gynecology | Admitting: Obstetrics and Gynecology

## 2023-02-06 DIAGNOSIS — D500 Iron deficiency anemia secondary to blood loss (chronic): Secondary | ICD-10-CM | POA: Diagnosis present

## 2023-02-06 MED ORDER — SODIUM CHLORIDE 0.9 % IV SOLN
300.0000 mg | INTRAVENOUS | Status: DC
  Administered 2023-02-06: 300 mg via INTRAVENOUS
  Filled 2023-02-06: qty 300

## 2023-02-06 NOTE — Progress Notes (Signed)
1st dose of iron infusion completed today. VSS. No signs of acute distress. Scheduled for 2nd dose next weekend.

## 2023-02-12 ENCOUNTER — Inpatient Hospital Stay: Admission: RE | Admit: 2023-02-12 | Payer: 59 | Source: Ambulatory Visit

## 2023-02-17 ENCOUNTER — Ambulatory Visit
Admission: RE | Admit: 2023-02-17 | Discharge: 2023-02-17 | Disposition: A | Discharge status: Home / Self Care | Payer: 59 | Source: Ambulatory Visit | Attending: Obstetrics and Gynecology | Admitting: Obstetrics and Gynecology

## 2023-02-17 DIAGNOSIS — D500 Iron deficiency anemia secondary to blood loss (chronic): Secondary | ICD-10-CM

## 2023-02-17 MED ORDER — SODIUM CHLORIDE 0.9 % IV SOLN
300.0000 mg | INTRAVENOUS | Status: AC
  Administered 2023-02-17: 300 mg via INTRAVENOUS
  Filled 2023-02-17: qty 300

## 2023-02-24 ENCOUNTER — Ambulatory Visit
Admission: RE | Admit: 2023-02-24 | Payer: 59 | Source: Ambulatory Visit | Attending: Nurse Practitioner | Admitting: Nurse Practitioner

## 2023-02-25 ENCOUNTER — Ambulatory Visit
Admission: RE | Admit: 2023-02-25 | Discharge: 2023-02-25 | Disposition: A | Discharge status: Home / Self Care | Payer: 59 | Source: Ambulatory Visit | Attending: Obstetrics and Gynecology | Admitting: Obstetrics and Gynecology

## 2023-02-25 DIAGNOSIS — D500 Iron deficiency anemia secondary to blood loss (chronic): Secondary | ICD-10-CM | POA: Diagnosis present

## 2023-02-25 MED ORDER — SODIUM CHLORIDE 0.9 % IV SOLN
300.0000 mg | INTRAVENOUS | Status: AC
  Administered 2023-02-25: 300 mg via INTRAVENOUS
  Filled 2023-02-25: qty 300

## 2023-02-26 ENCOUNTER — Inpatient Hospital Stay
Admission: RE | Admit: 2023-02-26 | Payer: 59 | Source: Ambulatory Visit | Attending: Nurse Practitioner | Admitting: Nurse Practitioner

## 2023-03-31 NOTE — H&P (Signed)
[name] is a 42 y.o. female here for TAH , bilateral salpingectomy  .   [name] is a 42 y.o. female here for follow up for menorrhagia . She has completed 3 iron infusions   EMBX - no tissue identified . PAP negative    .G2P2 , s/p c/s x 2    Pt here for ED follow up for menorrhagia , syncope and  Blood transfusion . She was placed on high dose OCPs and bleeding has stopped .  U/s done on 01/15/23: Uterus   Measurements: 10.9 x 5.6 x 4.9 cm = volume: 155.2 mL. Anteverted.  Intramural fibroid of the fundus measuring 1.8 x 1.8 x 2.3 cm.  Intramural fibroid of the left posterior uterus measuring 1.6 x 1.5  x 2.0 cm.   Endometrium   Thickness: 13.3 mm.  No focal abnormality visualized.   Right ovary   Measurements: 3.4 x 2.2 x 3.0 cm = volume: 11.4 mL. Normal  appearance/no adnexal mass.   Left ovary   Measurements: 3.6 x 1.6 x 1.8 cm = volume: 5.9 mL. Normal  appearance/no adnexal mass.   Pulsed Doppler evaluation of both ovaries demonstrates normal  low-resistance arterial and venous waveforms.     embx: Comment: Part A-Endometrial Biopsy: MINUTE FRAGMENTS OF BENIGN ENDOCERVICAL GLANDS, MUCUS, AND BLOOD. NO ENDOMETRIAL TISSUE IDENTIFIED    Pap: neg , no HR HPV    Past Medical History:  has a past medical history of Arteriovenous malformation of brain (HHS-HCC), Diabetes mellitus without complication (CMS/HHS-HCC), Neuropathy, and Neuropathy.  Past Surgical History:  has a past surgical history that includes Cesarean section (2001, 2003); Laparoscopic tubal ligation (2003); and Reduction mammaplasty. Family History: family history is not on file. Social History:  reports that she has never smoked. She has never used smokeless tobacco. She reports that she does not drink alcohol and does not use drugs. OB/GYN History:  OB History       Gravida  2   Para  2   Term  2   Preterm      AB      Living  2        SAB      IAB      Ectopic      Molar       Multiple      Live Births  2             Allergies: is allergic to oxycodone. Medications:  Current Medications    Current Outpatient Medications:    gabapentin (NEURONTIN) 300 MG capsule, Take 2 capsules (600 mg total) by mouth at bedtime for 180 days, Disp: 60 capsule, Rfl: 3   lancets 30 gauge Misc, Use 60 each 2 (two) times daily, Disp: 100 each, Rfl: 5   norethindrone-ethinyl estradiol (NORTREL 1/35, 28,) 1-35 mg-mcg tablet, Take 1 tablet by mouth once daily, Disp: , Rfl:    nortriptyline (PAMELOR) 50 MG capsule, TAKE 2 CAPSULES (100 MG TOTAL) BY MOUTH AT BEDTIME, Disp: 180 capsule, Rfl: 3   blood glucose diagnostic test strip, 1 each (1 strip total) by XX route 2 (two) times daily Use as instructed., Disp: 100 each, Rfl: 5   blood glucose meter kit, by XX route as directed, Disp: 1 each, Rfl: 0   metoprolol succinate (TOPROL-XL) 25 MG XL tablet, Take 25 mg by mouth once daily (Patient not taking: Reported on 03/05/2023), Disp: , Rfl:      Review of Systems: General:  No fatigue or weight loss Eyes:                           No vision changes Ears:                            No hearing difficulty Respiratory:                No cough or shortness of breath Pulmonary:                  No asthma or shortness of breath Cardiovascular:           No chest pain, palpitations, dyspnea on exertion Gastrointestinal:          No abdominal bloating, chronic diarrhea, constipations, masses, pain or hematochezia Genitourinary:             No hematuria, dysuria, abnormal vaginal discharge, pelvic pain, Menometrorrhagia Lymphatic:                   No swollen lymph nodes Musculoskeletal:No muscle weakness Neurologic:                  No extremity weakness, syncope, seizure disorder Psychiatric:                  No history of depression, delusions or suicidal/homicidal ideation      Exam:       Vitals:    010/1/24  1553  BP: (!) 136/95  Pulse: 108      Body mass  index is 29.69 kg/m.   WDWN / black female in NAD   Lungs: CTA  CV : RRR without murmur   Breast: exam done in sitting and lying position : No dimpling or retraction, no dominant mass, no spontaneous discharge, no axillary adenopathy Neck:  no thyromegaly Abdomen: soft , no mass, normal active bowel sounds,  non-tender, no rebound tenderness Pelvic: tanner stage 5 ,  External genitalia: vulva /labia no lesions Urethra: no prolapse Vagina: normal physiologic d/c Cervix:  cervix high no lesions, no cervical motion tenderness   Uterus: normal size shape and contour, non-tender Adnexa: no mass,  non-tender   Rectovaginal:  Impression:    The primary encounter diagnosis was Menorrhagia with regular cycle. A diagnosis of Iron deficiency anemia due to chronic blood loss was also pertinent to this visit.       Plan:  Pt has been counseled for options ( see prior note) . She has elected for definitve hysterectomy . Given anatomy and 2 prior c/s I recommend a TAH , Bilateral salpingectomy  Benefits and risks to surgery: The proposed benefit of the surgery has been discussed with the patient. The possible risks include, but are not limited to: organ injury to the bowel , bladder, ureters, and major blood vessels and nerves. There is a possibility of additional surgeries resulting from these injuries. There is also the risk of blood transfusion and the need to receive blood products during or after the procedure which may rarely lead to HIV or Hepatitis C infection. There is a risk of developing a deep venous thrombosis or a pulmonary embolism . There is the possibility of wound infection and also anesthetic complications, even the rare possibility of death. The patient understands these risks and wishes to proceed. All questions have been answered and the consent has been signed.           Marland Kitchen  Vilma Prader, MD

## 2023-04-01 ENCOUNTER — Other Ambulatory Visit: Payer: Self-pay | Admitting: Obstetrics and Gynecology

## 2023-04-01 DIAGNOSIS — D500 Iron deficiency anemia secondary to blood loss (chronic): Secondary | ICD-10-CM

## 2023-04-02 ENCOUNTER — Ambulatory Visit
Admission: RE | Admit: 2023-04-02 | Discharge: 2023-04-02 | Disposition: A | Discharge status: Home / Self Care | Payer: 59 | Source: Ambulatory Visit | Attending: Obstetrics and Gynecology | Admitting: Obstetrics and Gynecology

## 2023-04-02 DIAGNOSIS — D500 Iron deficiency anemia secondary to blood loss (chronic): Secondary | ICD-10-CM | POA: Diagnosis present

## 2023-04-02 MED ORDER — SODIUM CHLORIDE 0.9 % IV SOLN
300.0000 mg | Freq: Once | INTRAVENOUS | Status: AC
  Administered 2023-04-02: 300 mg via INTRAVENOUS
  Filled 2023-04-02: qty 15

## 2023-04-03 ENCOUNTER — Encounter
Admission: RE | Admit: 2023-04-03 | Discharge: 2023-04-03 | Disposition: A | Discharge status: Home / Self Care | Payer: 59 | Source: Ambulatory Visit | Attending: Obstetrics and Gynecology | Admitting: Obstetrics and Gynecology

## 2023-04-03 ENCOUNTER — Other Ambulatory Visit: Payer: Self-pay

## 2023-04-03 DIAGNOSIS — E1149 Type 2 diabetes mellitus with other diabetic neurological complication: Secondary | ICD-10-CM

## 2023-04-03 DIAGNOSIS — Z01812 Encounter for preprocedural laboratory examination: Secondary | ICD-10-CM

## 2023-04-03 HISTORY — DX: Anemia, unspecified: D64.9

## 2023-04-03 HISTORY — DX: Personal history of urinary calculi: Z87.442

## 2023-04-03 HISTORY — DX: Pneumonia, unspecified organism: J18.9

## 2023-04-03 NOTE — Patient Instructions (Addendum)
Your procedure is scheduled on: 04/11/23 - Friday Report to the Registration Desk on the 1st floor of the [facility]. To find out your arrival time, please call [phone] between 1PM - 3PM on: 04/10/23 - Thursday If your arrival time is 6:00 am, do not arrive before that time as the [facility] entrance doors do not open until 6:00 am.  REMEMBER: Instructions that are not followed completely may result in serious medical risk, up to and including death; or upon the discretion of your surgeon and anesthesiologist your surgery may need to be rescheduled.  Do not eat food or drink any liquids after midnight the night before surgery.  No gum chewing or hard candies.   One week prior to surgery: Stop Anti-inflammatories (NSAIDS) such as Advil, Aleve, Ibuprofen, Motrin, Naproxen, Naprosyn and Aspirin based products such as Excedrin, Goody's Powder, BC Powder. You may take Tylenol if needed for pain up until the day of surgery.  Stop ANY OVER THE COUNTER supplements until after surgery.  Continue taking all prescribed medications with the exception of the following:  Take 1/2 of your prescribes dose of Insulin glargine (LANTUS) on the night before your surgery.   TAKE ONLY THESE MEDICATIONS THE MORNING OF SURGERY WITH A SIP OF WATER:  metoprolol tartrate (LOPRESSOR) if needed.   No Alcohol for 24 hours before or after surgery.  No Smoking including e-cigarettes for 24 hours before surgery.  No chewable tobacco products for at least 6 hours before surgery.  No nicotine patches on the day of surgery.  Do not use any "recreational" drugs for at least a week (preferably 2 weeks) before your surgery.  Please be advised that the combination of cocaine and anesthesia may have negative outcomes, up to and including death. If you test positive for cocaine, your surgery will be cancelled.  On the morning of surgery brush your teeth with toothpaste and water, you may rinse your mouth with  mouthwash if you wish. Do not swallow any toothpaste or mouthwash.  Use CHG Soap or wipes as directed on instruction sheet.  Do not wear jewelry, make-up, hairpins, clips or nail polish.  For welded (permanent) jewelry: bracelets, anklets, waist bands, etc.  Please have this removed prior to surgery.  If it is not removed, there is a chance that hospital personnel will need to cut it off on the day of surgery.  Do not wear lotions, powders, or perfumes.   Do not shave body hair from the neck down 48 hours before surgery.  Contact lenses, hearing aids and dentures may not be worn into surgery.  Do not bring valuables to the hospital. [facility] is not responsible for any missing/lost belongings or valuables.   Notify your doctor if there is any change in your medical condition (cold, fever, infection).  Wear comfortable clothing (specific to your surgery type) to the hospital.  After surgery, you can help prevent lung complications by doing breathing exercises.  Take deep breaths and cough every 1-2 hours. Your doctor may order a device called an Incentive Spirometer to help you take deep breaths. When coughing or sneezing, hold a pillow firmly against your incision with both hands. This is called "splinting." Doing this helps protect your incision. It also decreases belly discomfort.  If you are being admitted to the hospital overnight, leave your suitcase in the car. After surgery it may be brought to your room.  In case of increased patient census, it may be necessary for you, the  patient, to continue your postoperative care in the Same Day Surgery department.  If you are being discharged the day of surgery, you will not be allowed to drive home. You will need a responsible individual to drive you home and stay with you for 24 hours after surgery.   If you are taking public transportation, you will need to have a responsible individual with you.  Please call the Pre-admissions  Testing Dept. at [phone] if you have any questions about these instructions.  Surgery Visitation Policy:  Patients having surgery or a procedure may have two visitors.  Children under the age of 35 must have an adult with them who is not the patient.  Inpatient Visitation:    Visiting hours are 7 a.m. to 8 p.m. Up to four visitors are allowed at one time in a patient room. The visitors may rotate out with other people during the day.  One visitor age 39 or older may stay with the patient overnight and must be in the room by 8 p.m.    Preparing for Surgery with CHLORHEXIDINE GLUCONATE (CHG) Soap  Chlorhexidine Gluconate (CHG) Soap  o An antiseptic cleaner that kills germs and bonds with the skin to continue killing germs even after washing  o Used for showering the night before surgery and morning of surgery  Before surgery, you can play an important role by reducing the number of germs on your skin.  CHG (Chlorhexidine gluconate) soap is an antiseptic cleanser which kills germs and bonds with the skin to continue killing germs even after washing.  Please do not use if you have an allergy to CHG or antibacterial soaps. If your skin becomes reddened/irritated stop using the CHG.  1. Shower the NIGHT BEFORE SURGERY and the MORNING OF SURGERY with CHG soap.  2. If you choose to wash your hair, wash your hair first as usual with your normal shampoo.  3. After shampooing, rinse your hair and body thoroughly to remove the shampoo.  4. Use CHG as you would any other liquid soap. You can apply CHG directly to the skin and wash gently with a scrungie or a clean washcloth.  5. Apply the CHG soap to your body only from the neck down. Do not use on open wounds or open sores. Avoid contact with your eyes, ears, mouth, and genitals (private parts). Wash face and genitals (private parts) with your normal soap.  6. Wash thoroughly, paying special attention to the area where your surgery  will be performed.  7. Thoroughly rinse your body with warm water.  8. Do not shower/wash with your normal soap after using and rinsing off the CHG soap.  9. Pat yourself dry with a clean towel.  10. Wear clean pajamas to bed the night before surgery.  12. Place clean sheets on your bed the night of your first shower and do not sleep with pets.  13. Shower again with the CHG soap on the day of surgery prior to arriving at the hospital.  14. Do not apply any deodorants/lotions/powders.  15. Please wear clean clothes to the hospital.  How to Use an Incentive Spirometer  An incentive spirometer is a tool that measures how well you are filling your lungs with each breath. Learning to take long, deep breaths using this tool can help you keep your lungs clear and active. This may help to reverse or lessen your chance of developing breathing (pulmonary) problems, especially infection. You may be asked to use a  spirometer: After a surgery. If you have a lung problem or a history of smoking. After a long period of time when you have been unable to move or be active. If the spirometer includes an indicator to show the highest number that you have reached, your health care provider or respiratory therapist will help you set a goal. Keep a log of your progress as told by your health care provider. What are the risks? Breathing too quickly may cause dizziness or cause you to pass out. Take your time so you do not get dizzy or light-headed. If you are in pain, you may need to take pain medicine before doing incentive spirometry. It is harder to take a deep breath if you are having pain. How to use your incentive spirometer  Sit up on the edge of your bed or on a chair. Hold the incentive spirometer so that it is in an upright position. Before you use the spirometer, breathe out normally. Place the mouthpiece in your mouth. Make sure your lips are closed tightly around it. Breathe in slowly and as  deeply as you can through your mouth, causing the piston or the ball to rise toward the top of the chamber. Hold your breath for 3-5 seconds, or for as long as possible. If the spirometer includes a coach indicator, use this to guide you in breathing. Slow down your breathing if the indicator goes above the marked areas. Remove the mouthpiece from your mouth and breathe out normally. The piston or ball will return to the bottom of the chamber. Rest for a few seconds, then repeat the steps 10 or more times. Take your time and take a few normal breaths between deep breaths so that you do not get dizzy or light-headed. Do this every 1-2 hours when you are awake. If the spirometer includes a goal marker to show the highest number you have reached (best effort), use this as a goal to work toward during each repetition. After each set of 10 deep breaths, cough a few times. This will help to make sure that your lungs are clear. If you have an incision on your chest or abdomen from surgery, place a pillow or a rolled-up towel firmly against the incision when you cough. This can help to reduce pain while taking deep breaths and coughing. General tips When you are able to get out of bed: Walk around often. Continue to take deep breaths and cough in order to clear your lungs. Keep using the incentive spirometer until your health care provider says it is okay to stop using it. If you have been in the hospital, you may be told to keep using the spirometer at home. Contact a health care provider if: You are having difficulty using the spirometer. You have trouble using the spirometer as often as instructed. Your pain medicine is not giving enough relief for you to use the spirometer as told. You have a fever. Get help right away if: You develop shortness of breath. You develop a cough with bloody mucus from the lungs. You have fluid or blood coming from an incision site after you cough. Summary An  incentive spirometer is a tool that can help you learn to take long, deep breaths to keep your lungs clear and active. You may be asked to use a spirometer after a surgery, if you have a lung problem or a history of smoking, or if you have been inactive for a long period of time. Use your  incentive spirometer as instructed every 1-2 hours while you are awake. If you have an incision on your chest or abdomen, place a pillow or a rolled-up towel firmly against your incision when you cough. This will help to reduce pain. Get help right away if you have shortness of breath, you cough up bloody mucus, or blood comes from your incision when you cough. This information is not intended to replace advice given to you by your health care provider. Make sure you discuss any questions you have with your health care provider. Document Revised: 09/06/2019 Document Reviewed: 09/06/2019 Elsevier Patient Education  2023 [employer].

## 2023-04-06 ENCOUNTER — Encounter (HOSPITAL_COMMUNITY): Payer: Self-pay | Admitting: Urgent Care

## 2023-04-07 ENCOUNTER — Encounter
Admission: RE | Admit: 2023-04-07 | Discharge: 2023-04-07 | Disposition: A | Discharge status: Home / Self Care | Payer: 59 | Source: Ambulatory Visit | Attending: Obstetrics and Gynecology | Admitting: Obstetrics and Gynecology

## 2023-04-07 DIAGNOSIS — Z794 Long term (current) use of insulin: Secondary | ICD-10-CM | POA: Diagnosis not present

## 2023-04-07 DIAGNOSIS — N920 Excessive and frequent menstruation with regular cycle: Secondary | ICD-10-CM | POA: Insufficient documentation

## 2023-04-07 DIAGNOSIS — Z01818 Encounter for other preprocedural examination: Secondary | ICD-10-CM

## 2023-04-07 DIAGNOSIS — Z01812 Encounter for preprocedural laboratory examination: Secondary | ICD-10-CM | POA: Diagnosis present

## 2023-04-07 DIAGNOSIS — E1165 Type 2 diabetes mellitus with hyperglycemia: Secondary | ICD-10-CM | POA: Diagnosis not present

## 2023-04-07 DIAGNOSIS — D649 Anemia, unspecified: Secondary | ICD-10-CM | POA: Diagnosis not present

## 2023-04-07 LAB — CBC
HCT: 38.7 % (ref 36.0–46.0)
Hemoglobin: 12.6 g/dL (ref 12.0–15.0)
MCH: 26.1 pg (ref 26.0–34.0)
MCHC: 32.6 g/dL (ref 30.0–36.0)
MCV: 80.3 fL (ref 80.0–100.0)
Platelets: 342 10*3/uL (ref 150–400)
RBC: 4.82 MIL/uL (ref 3.87–5.11)
RDW: 18 % — ABNORMAL HIGH (ref 11.5–15.5)
WBC: 7.5 10*3/uL (ref 4.0–10.5)
nRBC: 0 % (ref 0.0–0.2)

## 2023-04-07 LAB — BASIC METABOLIC PANEL
Anion gap: 13 (ref 5–15)
BUN: 15 mg/dL (ref 6–20)
CO2: 24 mmol/L (ref 22–32)
Calcium: 8.8 mg/dL — ABNORMAL LOW (ref 8.9–10.3)
Chloride: 90 mmol/L — ABNORMAL LOW (ref 98–111)
Creatinine, Ser: 0.89 mg/dL (ref 0.44–1.00)
GFR, Estimated: >60 mL/min (ref >60)
Glucose, Bld: 622 mg/dL (ref 70–99)
Potassium: 4.2 mmol/L (ref 3.5–5.1)
Sodium: 127 mmol/L — ABNORMAL LOW (ref 135–145)

## 2023-04-07 LAB — TYPE AND SCREEN
ABO/RH(D): B POS
Antibody Screen: NEGATIVE
Extend sample reason: TRANSFUSED

## 2023-04-07 NOTE — Progress Notes (Signed)
[facility] Perioperative Services: Pre-Admission/Anesthesia Testing  Abnormal Lab Notification   Date: 04/07/23  Name: Mary Velasquez MRN:   [id number]  Re: Abnormal labs noted during PAT appointment   Notified:    Provider Name Provider Role Notification Mode  Schermerhorn, Ihor Austin, * OB/GYN (Surgeon) Routed and/or faxed via [facility]   ABNORMAL LAB VALUE(S):   Lab Results  Component Value Date   GLUCOSE [address] [facility]) 04/07/2023   Clinical Information and Notes:  Mary Velasquez is scheduled for a HYSTERECTOMY ABDOMINAL WITH SALPINGECTOMY (Bilateral) on 04/11/2023.  Patient has a history of uncontrolled T2DM. She is currently managing her diabetes with both rapid acting aspart (meal coverage) and basal glargine.   Last Hgb A1c was 15.4% on 01/15/2023. Critical glucose level noted on today's preoperative labs. Value was 622 mg/dL, Renal function normal with a creatinine of 0.89 mg/dL. Anion gap WNL at 13. Sodium level low at 127 mmol/L (pseudohyponatremia); corrects to 135 mmol/L.   Aside from notifying attending surgeon of acute hyperglycemic event, in efforts to reduce the risk of developing SSI or other potential perioperative complications, this communication is being sent in order to determine if patient is deemed to be adequately optimized  for surgery.   In light of the aforementioned A1c, her diabetes could pose increased risks for the patient during their perioperative course and subsequent recovery period. With that being said, the benefit of improving glycemic control must be weighed against the overall risks associated with delaying a necessary elective surgical procedure for this patient.   Plans:  Patient noted to be significantly hyperglycemic on preoperative labs today. Contacted patient to discuss. In speaking with the patient, she advised that she was "feeling fine". Patient goes on to report that her hyperglycemia is her fault, as she drank a large  sweetened soda just prior to coming in for her lab work. Despite this information, glucose levels this elevated is concerning as it places patient at risk of complications related to her diabetes; mainly DKA.   Patient wishes to avoid being seen in the ED if possible. Again, she has insulin at home to cover noted hyperglycemic event.  Patient has not checked a FSBS since being home. She was asked to take her meal coverage insulin, check her CBG, and return call to me to discuss plans. Patient was asked to administer normal meal time coverage and return call to PAT department after she has checked her FSBS. I spoke with patient again, approximately 30 minutes following insulin administration. CBG now 418 after normal meal time aspart coverage. Patient still has to take her basal coverage as well. Of note, patient reminded that basal coverage would not have an immediate effect on her blood glucose levels. Patient advised to recheck glucose levels prior to going to bed tonight. If levels > 300 mg/dL, recommendation is for her to be seen in the ED for further evaluation and treatment, as her glucose levels being significantly elevated could result in complications such as DKA; verbalized understanding.   In addition to this notice, real time message was sent to attending surgeon as a FYI, as this patient is pending surgical procedure with him later this week. MD advised that he was not on call and directed me to contact on call physician. On call provider at this time is Dr. Jean Rosenthal; results communicated results to MD. Dr. Jean Rosenthal agreed with plans for ED. MD was made aware that patient wishes to avoid ED, insulin had been administered, and some  improvement in her CBG already noted (622 --> 418 mg/dL). Baseline CBGs in the 250-400 range. MD made aware of plans for reassessment of glucose levels later today, with patient being advised to present to the ED if > 300 mg/dL. MD in agreement with plan at this time.    Patient is a G2P2 s/p cesarean section x 2. She is experiencing menorrhagia that has led to symptomatic anemia requiring PRBC transfusion. Bleeding subsided with high dose OCP. Patient was advised that preoperative glucose and A1c may preclude her from having surgery at this time. Discussed rationale of increased intraoperative risks, postoperative delayed would healing, and postoperative infection. Again, notice being sent to attending surgeon to solicit thoughts on whether or not case should proceed, or alternatively, if further medical optimization of patient's diabetes should be pursed prior to elective surgical intervention. Of note, no PCP on file, thus note could not ne forwarded.    Quentin Mulling, MSN, APRN, FNP-C, CEN [facility]  Perioperative Services Nurse Practitioner Phone: [phone] Fax: [phone] 04/07/23 5:22 PM

## 2023-04-08 ENCOUNTER — Other Ambulatory Visit: Payer: Self-pay | Admitting: Obstetrics and Gynecology

## 2023-04-11 ENCOUNTER — Inpatient Hospital Stay: Admission: RE | Admit: 2023-04-11 | Payer: 59 | Source: Home / Self Care | Admitting: Obstetrics and Gynecology

## 2023-04-11 ENCOUNTER — Encounter: Admission: RE | Payer: Self-pay | Source: Home / Self Care

## 2023-04-11 DIAGNOSIS — Z01818 Encounter for other preprocedural examination: Secondary | ICD-10-CM

## 2023-04-11 SURGERY — HYSTERECTOMY, TOTAL, ABDOMINAL, WITH SALPINGECTOMY
Anesthesia: General | Laterality: Bilateral

## 2023-07-16 NOTE — H&P (Signed)
 Mary Velasquez is a 43 y.o. female here for menorrhagia . She was scheduled for TAH 10/24 , DM was out of control. She is s/p 3 courses of venofer  for iron  .  A1C  in 10/24 = 15.9----> now 9.5 ( jan 2025) .G2P2 , s/p c/s x 2    Pt here for ED follow up for menorrhagia , syncope and  Blood transfusion . She was placed on high dose OCPs and bleeding has stopped .  U/s done on 01/15/23: Uterus   Measurements: 10.9 x 5.6 x 4.9 cm = volume: 155.2 mL. Anteverted.  Intramural fibroid of the fundus measuring 1.8 x 1.8 x 2.3 cm.  Intramural fibroid of the left posterior uterus measuring 1.6 x 1.5  x 2.0 cm.   Endometrium   Thickness: 13.3 mm.  No focal abnormality visualized.   Right ovary   Measurements: 3.4 x 2.2 x 3.0 cm = volume: 11.4 mL. Normal  appearance/no adnexal mass.   Left ovary   Measurements: 3.6 x 1.6 x 1.8 cm = volume: 5.9 mL. Normal  appearance/no adnexal mass.   Pulsed Doppler evaluation of both ovaries demonstrates normal  low-resistance arterial and venous waveforms.      Past Medical History:  has a past medical history of Arteriovenous malformation of brain (HHS-HCC), Diabetes mellitus without complication (CMS/HHS-HCC), Neuropathy, and Neuropathy.  Past Surgical History:  has a past surgical history that includes Cesarean section (2001, 2003); Laparoscopic tubal ligation (2003); and Reduction mammaplasty. Family History: family history is not on file. Social History:  reports that she has never smoked. She has never used smokeless tobacco. She reports that she does not drink alcohol and does not use drugs. OB/GYN History:  OB History       Gravida  2   Para  2   Term  2   Preterm      AB      Living  2        SAB      IAB      Ectopic      Molar      Multiple      Live Births  2             Allergies: is allergic to oxycodone. Medications:   Current Medications    Current Outpatient Medications:    gabapentin  (NEURONTIN ) 300 MG  capsule, Take 2 capsules (600 mg total) by mouth at bedtime for 180 days, Disp: 60 capsule, Rfl: 3   lancets 30 gauge Misc, Use 60 each 2 (two) times daily, Disp: 100 each, Rfl: 5   metoprolol  succinate (TOPROL -XL) 25 MG XL tablet, Take 25 mg by mouth once daily, Disp: , Rfl:    norethindrone -ethinyl estradiol  (NORTREL  1/35, 28,) 1-35 mg-mcg tablet, Take 1 tablet by mouth once daily, Disp: , Rfl:    nortriptyline  (PAMELOR ) 50 MG capsule, TAKE 2 CAPSULES (100 MG TOTAL) BY MOUTH AT BEDTIME, Disp: 180 capsule, Rfl: 3   blood glucose diagnostic test strip, 1 each (1 strip total) by XX route 2 (two) times daily Use as instructed., Disp: 100 each, Rfl: 5   blood glucose meter kit, by XX route as directed, Disp: 1 each, Rfl: 0      Review of Systems: General:                      No fatigue or weight loss Eyes:  No vision changes Ears:                            No hearing difficulty Respiratory:                No cough or shortness of breath Pulmonary:                  No asthma or shortness of breath Cardiovascular:           No chest pain, palpitations, dyspnea on exertion Gastrointestinal:          No abdominal bloating, chronic diarrhea, constipations, masses, pain or hematochezia Genitourinary:             No hematuria, dysuria, abnormal vaginal discharge,+ menorrhagia pelvic pain, Menometrorrhagia Lymphatic:                   No swollen lymph nodes Musculoskeletal:No muscle weakness Neurologic:                  No extremity weakness, syncope, seizure disorder Psychiatric:                  No history of depression, delusions or suicidal/homicidal ideation      Exam:         Vitals:  07/18/23   BP: 113/71  Pulse: 102      Body mass index is 30.47 kg/m.   WDWN / black female in NAD   Lungs: CTA  CV : RRR without murmur     Neck:  no thyromegaly Abdomen: soft , no mass, normal active bowel sounds,  non-tender, no rebound tenderness Pelvic: tanner stage 5  ,  External genitalia: vulva /labia no lesions Urethra: no prolapse Vagina: normal physiologic d/c wet mount : + clue cells  Cervix: cx High , no lesions, no cervical motion tenderness   Uterus: normal size shape and contour, non-tender Adnexa: no mass,  non-tender   Rectovaginal:  After verbal consent obtained :  EMBX : Endometrial biopsy:no endometrial tissue      Impression:    The primary encounter diagnosis was Menorrhagia with regular cycle. Diagnoses of Iron  deficiency anemia due to chronic blood loss, BV (bacterial vaginosis), and Leukorrhea were also pertinent to this visit.  improved diabetes      Plan:  Reschedule for definitive tx ie .[name] TAH bilateral salpingectomy   risks have been discussed with patient . See [facility] notes  Check cbc , ferritin     Electronically signed by Chalese Peach, Marnee Sink, MD on 07/10/2023  4:34 PM             Note viewed by patient

## 2023-07-22 ENCOUNTER — Other Ambulatory Visit: Payer: Self-pay

## 2023-07-22 ENCOUNTER — Encounter
Admission: RE | Admit: 2023-07-22 | Discharge: 2023-07-22 | Disposition: A | Discharge status: Home / Self Care | Payer: No Typology Code available for payment source | Source: Ambulatory Visit | Attending: Obstetrics and Gynecology | Admitting: Obstetrics and Gynecology

## 2023-07-22 VITALS — Ht 60.0 in | Wt 173.0 lb

## 2023-07-22 DIAGNOSIS — E1149 Type 2 diabetes mellitus with other diabetic neurological complication: Secondary | ICD-10-CM

## 2023-07-22 DIAGNOSIS — Z01812 Encounter for preprocedural laboratory examination: Secondary | ICD-10-CM

## 2023-07-22 NOTE — Patient Instructions (Addendum)
Your procedure is scheduled on: Thursday 07/31/23  Report to the Registration Desk on the 1st floor of the [facility]. To find out your arrival time, please call [phone] between 1PM - 3PM on: Wednesday 07/30/23 If your arrival time is 6:00 am, do not arrive before that time as the [facility] entrance doors do not open until 6:00 am.  REMEMBER: Instructions that are not followed completely may result in serious medical risk, up to and including death; or upon the discretion of your surgeon and anesthesiologist your surgery may need to be rescheduled.  Do not eat food after midnight the night before surgery.  No gum chewing or hard candies.  You may however, drink CLEAR liquids up to 2 hours before you are scheduled to arrive for your surgery. Do not drink anything within 2 hours of your scheduled arrival time.  Clear liquids include: - water   **Type 1 and Type 2 diabetics should only drink water.**  In addition, your doctor has ordered for you to drink the provided:  Gatorade G2 Drinking this carbohydrate drink up to two hours before surgery helps to reduce insulin resistance and improve patient outcomes. Please complete drinking 2 hours before scheduled arrival time.  One week prior to surgery: Stop Anti-inflammatories (NSAIDS) such as Advil, Aleve, Ibuprofen, Motrin, Naproxen, Naprosyn and Aspirin based products such as Excedrin, Goody's Powder, BC Powder. Stop ANY OVER THE COUNTER supplements until after surgery. Stop metFORMIN (GLUCOPHAGE) 1000 MG 2 days before your procedure (take last dose Monday 07/28/23)  You may however, continue to take Tylenol if needed for pain up until the day of surgery.  Take 1/2 of your normal insulin glargine (LANTUS) 100 UNIT/ML Solostar Pen dose the night before your surgery.   Continue taking all of your other prescription medications up until the day of surgery.  ON THE DAY OF SURGERY ONLY TAKE THESE MEDICATIONS WITH SIPS OF  WATER:  None    No Alcohol for 24 hours before or after surgery.  No Smoking including e-cigarettes for 24 hours before surgery.  No chewable tobacco products for at least 6 hours before surgery.  No nicotine patches on the day of surgery.  Do not use any "recreational" drugs for at least a week (preferably 2 weeks) before your surgery.  Please be advised that the combination of cocaine and anesthesia may have negative outcomes, up to and including death. If you test positive for cocaine, your surgery will be cancelled.  On the morning of surgery brush your teeth with toothpaste and water, you may rinse your mouth with mouthwash if you wish. Do not swallow any toothpaste or mouthwash.  Use CHG Soap or wipes as directed on instruction sheet.  Do not wear jewelry, make-up, hairpins, clips or nail polish.  For welded (permanent) jewelry: bracelets, anklets, waist bands, etc.  Please have this removed prior to surgery.  If it is not removed, there is a chance that hospital personnel will need to cut it off on the day of surgery.  Do not wear lotions, powders, or perfumes.   Do not shave body hair from the neck down 48 hours before surgery.  Contact lenses, hearing aids and dentures may not be worn into surgery.  Do not bring valuables to the hospital. [facility] is not responsible for any missing/lost belongings or valuables.   Notify your doctor if there is any change in your medical condition (cold, fever, infection).  Wear comfortable clothing (specific to your surgery type) to  the hospital.  After surgery, you can help prevent lung complications by doing breathing exercises.  Take deep breaths and cough every 1-2 hours. Your doctor may order a device called an Incentive Spirometer to help you take deep breaths. When coughing or sneezing, hold a pillow firmly against your incision with both hands. This is called "splinting." Doing this helps protect your incision. It also  decreases belly discomfort.  If you are being admitted to the hospital overnight, leave your suitcase in the car. After surgery it may be brought to your room.  In case of increased patient census, it may be necessary for you, the patient, to continue your postoperative care in the Same Day Surgery department.  If you are being discharged the day of surgery, you will not be allowed to drive home. You will need a responsible individual to drive you home and stay with you for 24 hours after surgery.   If you are taking public transportation, you will need to have a responsible individual with you.  Please call the Pre-admissions Testing Dept. at [phone] if you have any questions about these instructions.  Surgery Visitation Policy:  Patients having surgery or a procedure may have two visitors.  Children under the age of 61 must have an adult with them who is not the patient.  Temporary Visitor Restrictions Due to increasing cases of flu, RSV and COVID-19: Children ages 49 and under will not be able to visit patients in [facility] hospitals under most circumstances.  Inpatient Visitation:    Visiting hours are 7 a.m. to 8 p.m. Up to four visitors are allowed at one time in a patient room. The visitors may rotate out with other people during the day.  One visitor age 57 or older may stay with the patient overnight and must be in the room by 8 p.m.    Preparing for Surgery with CHLORHEXIDINE GLUCONATE (CHG) Soap  Chlorhexidine Gluconate (CHG) Soap  o An antiseptic cleaner that kills germs and bonds with the skin to continue killing germs even after washing  o Used for showering the night before surgery and morning of surgery  Before surgery, you can play an important role by reducing the number of germs on your skin.  CHG (Chlorhexidine gluconate) soap is an antiseptic cleanser which kills germs and bonds with the skin to continue killing germs even after washing.  Please  do not use if you have an allergy to CHG or antibacterial soaps. If your skin becomes reddened/irritated stop using the CHG.  1. Shower the NIGHT BEFORE SURGERY and the MORNING OF SURGERY with CHG soap.  2. If you choose to wash your hair, wash your hair first as usual with your normal shampoo.  3. After shampooing, rinse your hair and body thoroughly to remove the shampoo.  4. Use CHG as you would any other liquid soap. You can apply CHG directly to the skin and wash gently with a scrungie or a clean washcloth.  5. Apply the CHG soap to your body only from the neck down. Do not use on open wounds or open sores. Avoid contact with your eyes, ears, mouth, and genitals (private parts). Wash face and genitals (private parts) with your normal soap.  6. Wash thoroughly, paying special attention to the area where your surgery will be performed.  7. Thoroughly rinse your body with warm water.  8. Do not shower/wash with your normal soap after using and rinsing off the CHG soap.  9.  Pat yourself dry with a clean towel.  10. Wear clean pajamas to bed the night before surgery.  12. Place clean sheets on your bed the night of your first shower and do not sleep with pets.  13. Shower again with the CHG soap on the day of surgery prior to arriving at the hospital.  14. Do not apply any deodorants/lotions/powders.  15. Please wear clean clothes to the hospital.

## 2023-07-24 ENCOUNTER — Inpatient Hospital Stay: Admission: RE | Admit: 2023-07-24 | Payer: No Typology Code available for payment source | Source: Ambulatory Visit

## 2023-07-31 DIAGNOSIS — Z01818 Encounter for other preprocedural examination: Secondary | ICD-10-CM

## 2023-08-06 ENCOUNTER — Other Ambulatory Visit: Payer: Self-pay | Admitting: Obstetrics and Gynecology

## 2023-08-11 NOTE — H&P (Signed)
 Mary Velasquez is a 43 y.o. female here for menorrhagia . She was scheduled for TAH 10/24 , DM was out of control. She is s/p 3 courses of venofer  for iron  .  A1C  in 10/24 = 15.9----> now 9.5 ( jan 2025) .G2P2 , s/p c/s x 2    Pt here for ED follow up for menorrhagia , syncope and  Blood transfusion . She was placed on high dose OCPs and bleeding has stopped .  U/s done on 01/15/23: Uterus   Measurements: 10.9 x 5.6 x 4.9 cm = volume: 155.2 mL. Anteverted.  Intramural fibroid of the fundus measuring 1.8 x 1.8 x 2.3 cm.  Intramural fibroid of the left posterior uterus measuring 1.6 x 1.5  x 2.0 cm.   Endometrium   Thickness: 13.3 mm.  No focal abnormality visualized.   Right ovary   Measurements: 3.4 x 2.2 x 3.0 cm = volume: 11.4 mL. Normal  appearance/no adnexal mass.   Left ovary   Measurements: 3.6 x 1.6 x 1.8 cm = volume: 5.9 mL. Normal  appearance/no adnexal mass.   Pulsed Doppler evaluation of both ovaries demonstrates normal  low-resistance arterial and venous waveforms.      Past Medical History:  has a past medical history of Arteriovenous malformation of brain (HHS-HCC), Diabetes mellitus without complication (CMS/HHS-HCC), Neuropathy, and Neuropathy.  Past Surgical History:  has a past surgical history that includes Cesarean section (2001, 2003); Laparoscopic tubal ligation (2003); and Reduction mammaplasty. Family History: family history is not on file. Social History:  reports that she has never smoked. She has never used smokeless tobacco. She reports that she does not drink alcohol and does not use drugs. OB/GYN History:  OB History       Gravida  2   Para  2   Term  2   Preterm      AB      Living  2        SAB      IAB      Ectopic      Molar      Multiple      Live Births  2             Allergies: is allergic to oxycodone. Medications:   Current Medications    Current Outpatient Medications:    gabapentin  (NEURONTIN ) 300 MG  capsule, Take 2 capsules (600 mg total) by mouth at bedtime for 180 days, Disp: 60 capsule, Rfl: 3   lancets 30 gauge Misc, Use 60 each 2 (two) times daily, Disp: 100 each, Rfl: 5   metoprolol  succinate (TOPROL -XL) 25 MG XL tablet, Take 25 mg by mouth once daily, Disp: , Rfl:    norethindrone -ethinyl estradiol  (NORTREL  1/35, 28,) 1-35 mg-mcg tablet, Take 1 tablet by mouth once daily, Disp: , Rfl:    nortriptyline  (PAMELOR ) 50 MG capsule, TAKE 2 CAPSULES (100 MG TOTAL) BY MOUTH AT BEDTIME, Disp: 180 capsule, Rfl: 3   blood glucose diagnostic test strip, 1 each (1 strip total) by XX route 2 (two) times daily Use as instructed., Disp: 100 each, Rfl: 5   blood glucose meter kit, by XX route as directed, Disp: 1 each, Rfl: 0      Review of Systems: General:                      No fatigue or weight loss Eyes:  No vision changes Ears:                            No hearing difficulty Respiratory:                No cough or shortness of breath Pulmonary:                  No asthma or shortness of breath Cardiovascular:           No chest pain, palpitations, dyspnea on exertion Gastrointestinal:          No abdominal bloating, chronic diarrhea, constipations, masses, pain or hematochezia Genitourinary:             No hematuria, dysuria, abnormal vaginal discharge,+ menorrhagia pelvic pain, Menometrorrhagia Lymphatic:                   No swollen lymph nodes Musculoskeletal:No muscle weakness Neurologic:                  No extremity weakness, syncope, seizure disorder Psychiatric:                  No history of depression, delusions or suicidal/homicidal ideation      Exam:         Vitals:  08/12/23    BP: 113/71  Pulse: 102      Body mass index is 30.47 kg/m.   WDWN / black female in NAD   Lungs: CTA  CV : RRR without murmur     Neck:  no thyromegaly Abdomen: soft , no mass, normal active bowel sounds,  non-tender, no rebound tenderness Pelvic: tanner stage 5  ,  External genitalia: vulva /labia no lesions Urethra: no prolapse Vagina: normal physiologic d/c wet mount : + clue cells  Cervix: cx High , no lesions, no cervical motion tenderness   Uterus: normal size shape and contour, non-tender Adnexa: no mass,  non-tender   Rectovaginal:  After verbal consent obtained :  EMBX : Endometrial biopsy:no endometrial tissue       Impression:    The primary encounter diagnosis was Menorrhagia with regular cycle. Diagnoses of Iron  deficiency anemia due to chronic blood loss, BV (bacterial vaginosis), and Leukorrhea were also pertinent to this visit.  improved diabetes      Plan:  Reschedule for definitive tx ie .[name] TAH bilateral salpingectomy   risks have been discussed with patient . See [facility] notes  Check cbc , ferritin     Electronically signed by Meshelle Holness, Marnee Sink, MD

## 2023-08-15 ENCOUNTER — Encounter
Admission: RE | Admit: 2023-08-15 | Discharge: 2023-08-15 | Disposition: A | Discharge status: Home / Self Care | Payer: No Typology Code available for payment source | Source: Ambulatory Visit | Attending: Obstetrics and Gynecology | Admitting: Obstetrics and Gynecology

## 2023-08-15 HISTORY — DX: Cardiac arrhythmia, unspecified: I49.9

## 2023-08-15 NOTE — Patient Instructions (Signed)
 Your procedure is scheduled on: 08/21/23 Report to the Registration Desk on the 1st floor of the [facility]. To find out your arrival time, please call [phone] between 1PM - 3PM on: 08/20/23 If your arrival time is 6:00 am, do not arrive before that time as the [facility] entrance doors do not open until 6:00 am.  REMEMBER: Instructions that are not followed completely may result in serious medical risk, up to and including death; or upon the discretion of your surgeon and anesthesiologist your surgery may need to be rescheduled.  Do not eat food after midnight the night before surgery.  No gum chewing or hard candies.  You may however, drink water up to 2 hours before you are scheduled to arrive for your surgery. Do not drink anything within 2 hours of your scheduled arrival time.   In addition, your doctor has ordered for you to drink the provided:  Gatorade G2 Drinking this carbohydrate drink up to two hours before surgery helps to reduce insulin resistance and improve patient outcomes. Please complete drinking 2 hours before scheduled arrival time.  One week prior to surgery: Stop Anti-inflammatories (NSAIDS) such as Advil, Aleve, Ibuprofen, Motrin, Naproxen, Naprosyn and Aspirin based products such as Excedrin, Goody's Powder, BC Powder. Stop ANY OVER THE COUNTER supplements until after surgery.  You may however, continue to take Tylenol if needed for pain up until the day of surgery.  HOLD metFORMIN (GLUCOPHAGE) beginning 08/19/23.  Take 1/2 of your scheduled dose of insulin glargine (LANTUS) on the night before your surgery.  HOLD insulin aspart (NOVOLOG) on the morning of surgery, may resume with meals  ON THE DAY OF SURGERY ONLY TAKE THESE MEDICATIONS WITH SIPS OF WATER:  none   No Alcohol for 24 hours before or after surgery.  No Smoking including e-cigarettes for 24 hours before surgery.  No chewable tobacco products for at least 6 hours before surgery.   No nicotine patches on the day of surgery.  Do not use any "recreational" drugs for at least a week (preferably 2 weeks) before your surgery.  Please be advised that the combination of cocaine and anesthesia may have negative outcomes, up to and including death. If you test positive for cocaine, your surgery will be cancelled.  On the morning of surgery brush your teeth with toothpaste and water, you may rinse your mouth with mouthwash if you wish. Do not swallow any toothpaste or mouthwash.  Use CHG Soap or wipes as directed on instruction sheet.  Do not wear jewelry, make-up, hairpins, clips or nail polish.  For welded (permanent) jewelry: bracelets, anklets, waist bands, etc.  Please have this removed prior to surgery.  If it is not removed, there is a chance that hospital personnel will need to cut it off on the day of surgery.  Do not wear lotions, powders, or perfumes.   Do not shave body hair from the neck down 48 hours before surgery.  Contact lenses, hearing aids and dentures may not be worn into surgery.  Do not bring valuables to the hospital. [facility] is not responsible for any missing/lost belongings or valuables.   Notify your doctor if there is any change in your medical condition (cold, fever, infection).  Wear comfortable clothing (specific to your surgery type) to the hospital.  After surgery, you can help prevent lung complications by doing breathing exercises.  Take deep breaths and cough every 1-2 hours. Your doctor may order a device called an Facilities manager to  help you take deep breaths. When coughing or sneezing, hold a pillow firmly against your incision with both hands. This is called "splinting." Doing this helps protect your incision. It also decreases belly discomfort.  If you are being admitted to the hospital overnight, leave your suitcase in the car. After surgery it may be brought to your room.  In case of increased patient census, it may  be necessary for you, the patient, to continue your postoperative care in the Same Day Surgery department.  If you are being discharged the day of surgery, you will not be allowed to drive home. You will need a responsible individual to drive you home and stay with you for 24 hours after surgery.   If you are taking public transportation, you will need to have a responsible individual with you.  Please call the Pre-admissions Testing Dept. at [phone] if you have any questions about these instructions.  Surgery Visitation Policy:  Patients having surgery or a procedure may have two visitors.  Children under the age of 79 must have an adult with them who is not the patient.  Temporary Visitor Restrictions Due to increasing cases of flu, RSV and COVID-19: Children ages 71 and under will not be able to visit patients in [facility] hospitals under most circumstances.  Inpatient Visitation:    Visiting hours are 7 a.m. to 8 p.m. Up to four visitors are allowed at one time in a patient room. The visitors may rotate out with other people during the day.  One visitor age 25 or older may stay with the patient overnight and must be in the room by 8 p.m.     Preparing for Surgery with CHLORHEXIDINE GLUCONATE (CHG) Soap  Chlorhexidine Gluconate (CHG) Soap  o An antiseptic cleaner that kills germs and bonds with the skin to continue killing germs even after washing  o Used for showering the night before surgery and morning of surgery  Before surgery, you can play an important role by reducing the number of germs on your skin.  CHG (Chlorhexidine gluconate) soap is an antiseptic cleanser which kills germs and bonds with the skin to continue killing germs even after washing.  Please do not use if you have an allergy to CHG or antibacterial soaps. If your skin becomes reddened/irritated stop using the CHG.  1. Shower the NIGHT BEFORE SURGERY and the MORNING OF SURGERY with CHG  soap.  2. If you choose to wash your hair, wash your hair first as usual with your normal shampoo.  3. After shampooing, rinse your hair and body thoroughly to remove the shampoo.  4. Use CHG as you would any other liquid soap. You can apply CHG directly to the skin and wash gently with a scrungie or a clean washcloth.  5. Apply the CHG soap to your body only from the neck down. Do not use on open wounds or open sores. Avoid contact with your eyes, ears, mouth, and genitals (private parts). Wash face and genitals (private parts) with your normal soap.  6. Wash thoroughly, paying special attention to the area where your surgery will be performed.  7. Thoroughly rinse your body with warm water.  8. Do not shower/wash with your normal soap after using and rinsing off the CHG soap.  9. Pat yourself dry with a clean towel.  10. Wear clean pajamas to bed the night before surgery.  12. Place clean sheets on your bed the night of your first shower and do  not sleep with pets.  13. Shower again with the CHG soap on the day of surgery prior to arriving at the hospital.  14. Do not apply any deodorants/lotions/powders.  15. Please wear clean clothes to the hospital.  How to Use an Incentive Spirometer  An incentive spirometer is a tool that measures how well you are filling your lungs with each breath. Learning to take long, deep breaths using this tool can help you keep your lungs clear and active. This may help to reverse or lessen your chance of developing breathing (pulmonary) problems, especially infection. You may be asked to use a spirometer: After a surgery. If you have a lung problem or a history of smoking. After a long period of time when you have been unable to move or be active. If the spirometer includes an indicator to show the highest number that you have reached, your health care provider or respiratory therapist will help you set a goal. Keep a log of your progress as told by  your health care provider. What are the risks? Breathing too quickly may cause dizziness or cause you to pass out. Take your time so you do not get dizzy or light-headed. If you are in pain, you may need to take pain medicine before doing incentive spirometry. It is harder to take a deep breath if you are having pain. How to use your incentive spirometer  Sit up on the edge of your bed or on a chair. Hold the incentive spirometer so that it is in an upright position. Before you use the spirometer, breathe out normally. Place the mouthpiece in your mouth. Make sure your lips are closed tightly around it. Breathe in slowly and as deeply as you can through your mouth, causing the piston or the ball to rise toward the top of the chamber. Hold your breath for 3-5 seconds, or for as long as possible. If the spirometer includes a coach indicator, use this to guide you in breathing. Slow down your breathing if the indicator goes above the marked areas. Remove the mouthpiece from your mouth and breathe out normally. The piston or ball will return to the bottom of the chamber. Rest for a few seconds, then repeat the steps 10 or more times. Take your time and take a few normal breaths between deep breaths so that you do not get dizzy or light-headed. Do this every 1-2 hours when you are awake. If the spirometer includes a goal marker to show the highest number you have reached (best effort), use this as a goal to work toward during each repetition. After each set of 10 deep breaths, cough a few times. This will help to make sure that your lungs are clear. If you have an incision on your chest or abdomen from surgery, place a pillow or a rolled-up towel firmly against the incision when you cough. This can help to reduce pain while taking deep breaths and coughing. General tips When you are able to get out of bed: Walk around often. Continue to take deep breaths and cough in order to clear your  lungs. Keep using the incentive spirometer until your health care provider says it is okay to stop using it. If you have been in the hospital, you may be told to keep using the spirometer at home. Contact a health care provider if: You are having difficulty using the spirometer. You have trouble using the spirometer as often as instructed. Your pain medicine is not giving enough  relief for you to use the spirometer as told. You have a fever. Get help right away if: You develop shortness of breath. You develop a cough with bloody mucus from the lungs. You have fluid or blood coming from an incision site after you cough. Summary An incentive spirometer is a tool that can help you learn to take long, deep breaths to keep your lungs clear and active. You may be asked to use a spirometer after a surgery, if you have a lung problem or a history of smoking, or if you have been inactive for a long period of time. Use your incentive spirometer as instructed every 1-2 hours while you are awake. If you have an incision on your chest or abdomen, place a pillow or a rolled-up towel firmly against your incision when you cough. This will help to reduce pain. Get help right away if you have shortness of breath, you cough up bloody mucus, or blood comes from your incision when you cough. This information is not intended to replace advice given to you by your health care provider. Make sure you discuss any questions you have with your health care provider. Document Revised: 09/06/2019 Document Reviewed: 09/06/2019 Elsevier Patient Education  2023 [employer].

## 2023-08-18 ENCOUNTER — Encounter: Payer: Self-pay | Admitting: Obstetrics and Gynecology

## 2023-08-18 ENCOUNTER — Encounter
Admission: RE | Admit: 2023-08-18 | Discharge: 2023-08-18 | Disposition: A | Discharge status: Home / Self Care | Payer: No Typology Code available for payment source | Source: Ambulatory Visit | Attending: Obstetrics and Gynecology | Admitting: Obstetrics and Gynecology

## 2023-08-18 DIAGNOSIS — Z01812 Encounter for preprocedural laboratory examination: Secondary | ICD-10-CM | POA: Insufficient documentation

## 2023-08-18 DIAGNOSIS — Z01818 Encounter for other preprocedural examination: Secondary | ICD-10-CM

## 2023-08-18 LAB — TYPE AND SCREEN
ABO/RH(D): B POS
Antibody Screen: NEGATIVE

## 2023-08-18 LAB — CBC
HCT: 37.8 % (ref 36.0–46.0)
Hemoglobin: 12.2 g/dL (ref 12.0–15.0)
MCH: 27 pg (ref 26.0–34.0)
MCHC: 32.3 g/dL (ref 30.0–36.0)
MCV: 83.6 fL (ref 80.0–100.0)
Platelets: 298 10*3/uL (ref 150–400)
RBC: 4.52 MIL/uL (ref 3.87–5.11)
RDW: 14.1 % (ref 11.5–15.5)
WBC: 6.6 10*3/uL (ref 4.0–10.5)
nRBC: 0 % (ref 0.0–0.2)

## 2023-08-18 LAB — BASIC METABOLIC PANEL
Anion gap: 9 (ref 5–15)
BUN: 14 mg/dL (ref 6–20)
CO2: 27 mmol/L (ref 22–32)
Calcium: 8.9 mg/dL (ref 8.9–10.3)
Chloride: 102 mmol/L (ref 98–111)
Creatinine, Ser: 0.78 mg/dL (ref 0.44–1.00)
GFR, Estimated: >60 mL/min (ref >60)
Glucose, Bld: 155 mg/dL — ABNORMAL HIGH (ref 70–99)
Potassium: 4.1 mmol/L (ref 3.5–5.1)
Sodium: 138 mmol/L (ref 135–145)

## 2023-08-20 NOTE — Anesthesia Preprocedure Evaluation (Signed)
 Anesthesia Evaluation  Patient identified by MRN, date of birth, ID band Patient awake    Reviewed: Allergy & Precautions, NPO status , Patient's Chart, lab work & pertinent test results  History of Anesthesia Complications Negative for: history of anesthetic complications  Airway Mallampati: IV   Neck ROM: Full    Dental no notable dental hx.    Pulmonary neg pulmonary ROS   Pulmonary exam normal breath sounds clear to auscultation       Cardiovascular Exercise Tolerance: Good Normal cardiovascular exam Rhythm:Regular Rate:Normal  ECG 01/15/23:  Sinus tachycardia (HR 109) Probable anteroseptal infarct, old  Echo 01/22/22:  NORMAL LEFT VENTRICULAR SYSTOLIC FUNCTION   WITH MILD LVH  NORMAL RIGHT VENTRICULAR SYSTOLIC FUNCTION  NO VALVULAR STENOSIS  TRIVIAL TR  MILD PR  EF >55%    Neuro/Psych  Neuromuscular disease (neuropathy)    GI/Hepatic ,GERD  ,,  Endo/Other  diabetes, Poorly Controlled, Type 2  Obesity   Renal/GU Renal disease (nephrolithiasis)     Musculoskeletal   Abdominal   Peds  Hematology  (+) Blood dyscrasia, anemia   Anesthesia Other Findings   Reproductive/Obstetrics                             Anesthesia Physical Anesthesia Plan  ASA: 3  Anesthesia Plan: General   Post-op Pain Management:    Induction: Intravenous  PONV Risk Score and Plan: 3 and Ondansetron, Dexamethasone and Treatment may vary due to age or medical condition  Airway Management Planned: Oral ETT  Additional Equipment:   Intra-op Plan:   Post-operative Plan: Extubation in OR  Informed Consent: I have reviewed the patients History and Physical, chart, labs and discussed the procedure including the risks, benefits and alternatives for the proposed anesthesia with the patient or authorized representative who has indicated his/her understanding and acceptance.     Dental advisory  given  Plan Discussed with: CRNA  Anesthesia Plan Comments: (Patient consented for risks of anesthesia including but not limited to:  - adverse reactions to medications - damage to eyes, teeth, lips or other oral mucosa - nerve damage due to positioning  - sore throat or hoarseness - damage to heart, brain, nerves, lungs, other parts of body or loss of life  Informed patient about role of CRNA in peri- and intra-operative care.  Patient voiced understanding.)        Anesthesia Quick Evaluation

## 2023-08-21 ENCOUNTER — Encounter
Admission: RE | Disposition: A | Discharge status: Home / Self Care | Payer: Self-pay | Source: Home / Self Care | Attending: Obstetrics and Gynecology

## 2023-08-21 ENCOUNTER — Inpatient Hospital Stay
Admission: RE | Admit: 2023-08-21 | Discharge: 2023-08-24 | DRG: 743 | Disposition: A | Discharge status: Home / Self Care | Payer: No Typology Code available for payment source | Attending: Obstetrics and Gynecology | Admitting: Obstetrics and Gynecology

## 2023-08-21 ENCOUNTER — Inpatient Hospital Stay: Payer: No Typology Code available for payment source | Admitting: Urgent Care

## 2023-08-21 ENCOUNTER — Other Ambulatory Visit: Payer: Self-pay

## 2023-08-21 ENCOUNTER — Encounter: Payer: Self-pay | Admitting: Obstetrics and Gynecology

## 2023-08-21 DIAGNOSIS — N920 Excessive and frequent menstruation with regular cycle: Principal | ICD-10-CM | POA: Diagnosis present

## 2023-08-21 DIAGNOSIS — E1140 Type 2 diabetes mellitus with diabetic neuropathy, unspecified: Secondary | ICD-10-CM | POA: Diagnosis present

## 2023-08-21 DIAGNOSIS — E119 Type 2 diabetes mellitus without complications: Secondary | ICD-10-CM

## 2023-08-21 DIAGNOSIS — Z79899 Other long term (current) drug therapy: Secondary | ICD-10-CM | POA: Diagnosis not present

## 2023-08-21 DIAGNOSIS — Z794 Long term (current) use of insulin: Secondary | ICD-10-CM

## 2023-08-21 DIAGNOSIS — E1149 Type 2 diabetes mellitus with other diabetic neurological complication: Secondary | ICD-10-CM

## 2023-08-21 DIAGNOSIS — Z7984 Long term (current) use of oral hypoglycemic drugs: Secondary | ICD-10-CM

## 2023-08-21 DIAGNOSIS — I10 Essential (primary) hypertension: Secondary | ICD-10-CM | POA: Diagnosis present

## 2023-08-21 DIAGNOSIS — Z9889 Other specified postprocedural states: Secondary | ICD-10-CM

## 2023-08-21 DIAGNOSIS — E669 Obesity, unspecified: Secondary | ICD-10-CM | POA: Diagnosis present

## 2023-08-21 DIAGNOSIS — D500 Iron deficiency anemia secondary to blood loss (chronic): Secondary | ICD-10-CM | POA: Diagnosis present

## 2023-08-21 DIAGNOSIS — Z6833 Body mass index (BMI) 33.0-33.9, adult: Secondary | ICD-10-CM

## 2023-08-21 DIAGNOSIS — Z885 Allergy status to narcotic agent status: Secondary | ICD-10-CM | POA: Diagnosis not present

## 2023-08-21 DIAGNOSIS — Z01812 Encounter for preprocedural laboratory examination: Secondary | ICD-10-CM

## 2023-08-21 DIAGNOSIS — Z01818 Encounter for other preprocedural examination: Principal | ICD-10-CM

## 2023-08-21 HISTORY — DX: Tachycardia, unspecified: R00.0

## 2023-08-21 HISTORY — PX: HYSTERECTOMY ABDOMINAL WITH SALPINGECTOMY: SHX6725

## 2023-08-21 HISTORY — PX: CYSTOSCOPY: SHX5120

## 2023-08-21 LAB — GLUCOSE, CAPILLARY
Glucose-Capillary: 114 mg/dL — ABNORMAL HIGH (ref 70–99)
Glucose-Capillary: 154 mg/dL — ABNORMAL HIGH (ref 70–99)
Glucose-Capillary: 171 mg/dL — ABNORMAL HIGH (ref 70–99)
Glucose-Capillary: 176 mg/dL — ABNORMAL HIGH (ref 70–99)
Glucose-Capillary: 205 mg/dL — ABNORMAL HIGH (ref 70–99)
Glucose-Capillary: 213 mg/dL — ABNORMAL HIGH (ref 70–99)

## 2023-08-21 LAB — CBC
HCT: 37.1 % (ref 36.0–46.0)
Hemoglobin: 12.1 g/dL (ref 12.0–15.0)
MCH: 27.1 pg (ref 26.0–34.0)
MCHC: 32.6 g/dL (ref 30.0–36.0)
MCV: 83 fL (ref 80.0–100.0)
Platelets: 300 10*3/uL (ref 150–400)
RBC: 4.47 MIL/uL (ref 3.87–5.11)
RDW: 13.8 % (ref 11.5–15.5)
WBC: 7.4 10*3/uL (ref 4.0–10.5)
nRBC: 0 % (ref 0.0–0.2)

## 2023-08-21 LAB — BASIC METABOLIC PANEL
Anion gap: 9 (ref 5–15)
BUN: 11 mg/dL (ref 6–20)
CO2: 27 mmol/L (ref 22–32)
Calcium: 8.7 mg/dL — ABNORMAL LOW (ref 8.9–10.3)
Chloride: 100 mmol/L (ref 98–111)
Creatinine, Ser: 0.81 mg/dL (ref 0.44–1.00)
GFR, Estimated: >60 mL/min (ref >60)
Glucose, Bld: 92 mg/dL (ref 70–99)
Potassium: 3.5 mmol/L (ref 3.5–5.1)
Sodium: 136 mmol/L (ref 135–145)

## 2023-08-21 LAB — POCT PREGNANCY, URINE: Preg Test, Ur: NEGATIVE

## 2023-08-21 SURGERY — HYSTERECTOMY, TOTAL, ABDOMINAL, WITH SALPINGECTOMY
Anesthesia: General | Site: Abdomen

## 2023-08-21 MED ORDER — SODIUM CHLORIDE (PF) 0.9 % IJ SOLN
INTRAMUSCULAR | Status: DC | PRN
  Administered 2023-08-21: 60 mL

## 2023-08-21 MED ORDER — DIPHENHYDRAMINE HCL 50 MG/ML IJ SOLN: 12.5000 mg | Freq: Four times a day (QID) | INTRAMUSCULAR | Status: DC | PRN

## 2023-08-21 MED ORDER — SODIUM CHLORIDE 0.9 % IV SOLN: INTRAVENOUS | Status: DC

## 2023-08-21 MED ORDER — CEFAZOLIN SODIUM-DEXTROSE 2-4 GM/100ML-% IV SOLN
INTRAVENOUS | Status: AC
  Filled 2023-08-21: qty 100

## 2023-08-21 MED ORDER — INSULIN ASPART 100 UNIT/ML IJ SOLN: 3.0000 [IU] | Freq: Three times a day (TID) | INTRAMUSCULAR | Status: DC

## 2023-08-21 MED ORDER — ONDANSETRON HCL 4 MG PO TABS: 8.0000 mg | ORAL_TABLET | Freq: Four times a day (QID) | ORAL | Status: DC | PRN

## 2023-08-21 MED ORDER — SODIUM CHLORIDE 0.9% FLUSH: 9.0000 mL | INTRAVENOUS | Status: DC | PRN

## 2023-08-21 MED ORDER — PHENYLEPHRINE HCL-NACL 20-0.9 MG/250ML-% IV SOLN
INTRAVENOUS | Status: DC | PRN
Start: 2023-08-21 — End: 2023-08-21
  Administered 2023-08-21: 50 ug/min via INTRAVENOUS

## 2023-08-21 MED ORDER — PHENYLEPHRINE HCL-NACL 20-0.9 MG/250ML-% IV SOLN
INTRAVENOUS | Status: AC
  Filled 2023-08-21: qty 250

## 2023-08-21 MED ORDER — BUPIVACAINE LIPOSOME 1.3 % IJ SUSP
INTRAMUSCULAR | Status: AC
  Filled 2023-08-21: qty 20

## 2023-08-21 MED ORDER — METRONIDAZOLE 500 MG/100ML IV SOLN
500.0000 mg | Freq: Two times a day (BID) | INTRAVENOUS | Status: DC
  Administered 2023-08-21 (×2): 500 mg via INTRAVENOUS
  Filled 2023-08-21 (×3): qty 100

## 2023-08-21 MED ORDER — NALOXONE HCL 0.4 MG/ML IJ SOLN: 0.4000 mg | INTRAMUSCULAR | Status: DC | PRN

## 2023-08-21 MED ORDER — LABETALOL HCL 5 MG/ML IV SOLN: 20.0000 mg | INTRAVENOUS | Status: DC | PRN

## 2023-08-21 MED ORDER — ACETAMINOPHEN 500 MG PO TABS
ORAL_TABLET | ORAL | Status: AC
  Filled 2023-08-21: qty 2

## 2023-08-21 MED ORDER — SIMETHICONE 80 MG PO CHEW: 80.0000 mg | CHEWABLE_TABLET | Freq: Four times a day (QID) | ORAL | Status: DC | PRN

## 2023-08-21 MED ORDER — CHLORHEXIDINE GLUCONATE 0.12 % MT SOLN
15.0000 mL | Freq: Once | OROMUCOSAL | Status: AC
  Administered 2023-08-21: 15 mL via OROMUCOSAL

## 2023-08-21 MED ORDER — CEFAZOLIN SODIUM-DEXTROSE 2-4 GM/100ML-% IV SOLN
2.0000 g | Freq: Once | INTRAVENOUS | Status: AC
  Administered 2023-08-21: 2 g via INTRAVENOUS
  Filled 2023-08-21: qty 100

## 2023-08-21 MED ORDER — GABAPENTIN 300 MG PO CAPS
300.0000 mg | ORAL_CAPSULE | ORAL | Status: AC
  Administered 2023-08-21: 300 mg via ORAL

## 2023-08-21 MED ORDER — DEXAMETHASONE SODIUM PHOSPHATE 10 MG/ML IJ SOLN
INTRAMUSCULAR | Status: DC | PRN
  Administered 2023-08-21: 5 mg via INTRAVENOUS

## 2023-08-21 MED ORDER — METHYLENE BLUE (ANTIDOTE) 1 % IV SOLN
INTRAVENOUS | Status: AC
  Filled 2023-08-21: qty 10

## 2023-08-21 MED ORDER — GABAPENTIN 300 MG PO CAPS
ORAL_CAPSULE | ORAL | Status: AC
  Filled 2023-08-21: qty 1

## 2023-08-21 MED ORDER — ONDANSETRON HCL 4 MG PO TABS: 4.0000 mg | ORAL_TABLET | Freq: Four times a day (QID) | ORAL | Status: DC | PRN

## 2023-08-21 MED ORDER — GABAPENTIN 300 MG PO CAPS
300.0000 mg | ORAL_CAPSULE | Freq: Once | ORAL | Status: AC
  Administered 2023-08-21: 300 mg via ORAL
  Filled 2023-08-21: qty 1

## 2023-08-21 MED ORDER — PROPOFOL 10 MG/ML IV BOLUS
INTRAVENOUS | Status: AC
Start: 2023-08-21 — End: ?
  Filled 2023-08-21: qty 40

## 2023-08-21 MED ORDER — KETOROLAC TROMETHAMINE 30 MG/ML IJ SOLN
INTRAMUSCULAR | Status: DC | PRN
  Administered 2023-08-21: 30 mg via INTRAVENOUS

## 2023-08-21 MED ORDER — KETAMINE HCL 50 MG/5ML IJ SOSY
PREFILLED_SYRINGE | INTRAMUSCULAR | Status: DC | PRN
  Administered 2023-08-21: 20 mg via INTRAVENOUS
  Administered 2023-08-21: 10 mg via INTRAVENOUS

## 2023-08-21 MED ORDER — FENTANYL CITRATE (PF) 100 MCG/2ML IJ SOLN
25.0000 ug | INTRAMUSCULAR | Status: DC | PRN
  Administered 2023-08-21 (×2): 50 ug via INTRAVENOUS

## 2023-08-21 MED ORDER — DEXMEDETOMIDINE HCL IN NACL 80 MCG/20ML IV SOLN
INTRAVENOUS | Status: AC
  Filled 2023-08-21: qty 20

## 2023-08-21 MED ORDER — LABETALOL HCL 5 MG/ML IV SOLN
20.0000 mg | Freq: Once | INTRAVENOUS | Status: AC
  Administered 2023-08-21: 20 mg via INTRAVENOUS
  Filled 2023-08-21: qty 4

## 2023-08-21 MED ORDER — FENTANYL CITRATE (PF) 100 MCG/2ML IJ SOLN
INTRAMUSCULAR | Status: AC
  Filled 2023-08-21: qty 2

## 2023-08-21 MED ORDER — LACTATED RINGERS IV SOLN: INTRAVENOUS | Status: DC

## 2023-08-21 MED ORDER — ACETAMINOPHEN 500 MG PO TABS
1000.0000 mg | ORAL_TABLET | ORAL | Status: AC
  Administered 2023-08-21: 1000 mg via ORAL

## 2023-08-21 MED ORDER — MIDAZOLAM HCL 2 MG/2ML IJ SOLN
INTRAMUSCULAR | Status: DC | PRN
  Administered 2023-08-21: 2 mg via INTRAVENOUS

## 2023-08-21 MED ORDER — SODIUM CHLORIDE (PF) 0.9 % IJ SOLN
INTRAMUSCULAR | Status: AC
  Filled 2023-08-21: qty 20

## 2023-08-21 MED ORDER — BUPIVACAINE HCL (PF) 0.5 % IJ SOLN
INTRAMUSCULAR | Status: AC
  Filled 2023-08-21: qty 60

## 2023-08-21 MED ORDER — EPHEDRINE SULFATE-NACL 50-0.9 MG/10ML-% IV SOSY
PREFILLED_SYRINGE | INTRAVENOUS | Status: DC | PRN
  Administered 2023-08-21: 5 mg via INTRAVENOUS
  Administered 2023-08-21 (×2): 10 mg via INTRAVENOUS

## 2023-08-21 MED ORDER — CHLORHEXIDINE GLUCONATE 0.12 % MT SOLN
OROMUCOSAL | Status: AC
  Filled 2023-08-21: qty 15

## 2023-08-21 MED ORDER — MORPHINE SULFATE 1 MG/ML IV SOLN PCA
INTRAVENOUS | Status: DC
  Filled 2023-08-21 (×2): qty 30

## 2023-08-21 MED ORDER — PHENYLEPHRINE 80 MCG/ML (10ML) SYRINGE FOR IV PUSH (FOR BLOOD PRESSURE SUPPORT)
PREFILLED_SYRINGE | INTRAVENOUS | Status: DC | PRN
  Administered 2023-08-21: 80 ug via INTRAVENOUS
  Administered 2023-08-21: 160 ug via INTRAVENOUS
  Administered 2023-08-21: 80 ug via INTRAVENOUS
  Administered 2023-08-21: 160 ug via INTRAVENOUS

## 2023-08-21 MED ORDER — POVIDONE-IODINE 10 % EX SWAB: 2.0000 | Freq: Once | CUTANEOUS | Status: DC

## 2023-08-21 MED ORDER — DEXMEDETOMIDINE HCL IN NACL 80 MCG/20ML IV SOLN
INTRAVENOUS | Status: DC | PRN
  Administered 2023-08-21: 8 ug via INTRAVENOUS
  Administered 2023-08-21: 4 ug via INTRAVENOUS

## 2023-08-21 MED ORDER — INSULIN ASPART 100 UNIT/ML IJ SOLN
4.0000 [IU] | Freq: Three times a day (TID) | INTRAMUSCULAR | Status: DC
  Administered 2023-08-21 (×2): 4 [IU] via SUBCUTANEOUS
  Filled 2023-08-21 (×2): qty 1

## 2023-08-21 MED ORDER — ACETAMINOPHEN 500 MG PO TABS
1000.0000 mg | ORAL_TABLET | Freq: Once | ORAL | Status: AC
  Administered 2023-08-21: 1000 mg via ORAL
  Filled 2023-08-21: qty 2

## 2023-08-21 MED ORDER — METRONIDAZOLE 500 MG/100ML IV SOLN: 500.0000 mg | Freq: Two times a day (BID) | INTRAVENOUS | Status: DC

## 2023-08-21 MED ORDER — ORAL CARE MOUTH RINSE: 15.0000 mL | Freq: Once | OROMUCOSAL | Status: AC

## 2023-08-21 MED ORDER — INSULIN GLARGINE 100 UNIT/ML ~~LOC~~ SOLN
10.0000 [IU] | Freq: Every day | SUBCUTANEOUS | Status: DC
  Administered 2023-08-21: 10 [IU] via SUBCUTANEOUS
  Filled 2023-08-21 (×2): qty 0.1

## 2023-08-21 MED ORDER — PROPOFOL 10 MG/ML IV BOLUS
INTRAVENOUS | Status: DC | PRN
  Administered 2023-08-21: 20 mg via INTRAVENOUS
  Administered 2023-08-21: 180 mg via INTRAVENOUS

## 2023-08-21 MED ORDER — KETAMINE HCL 50 MG/5ML IJ SOSY
PREFILLED_SYRINGE | INTRAMUSCULAR | Status: AC
  Filled 2023-08-21: qty 5

## 2023-08-21 MED ORDER — CEFAZOLIN SODIUM-DEXTROSE 2-4 GM/100ML-% IV SOLN
2.0000 g | Freq: Once | INTRAVENOUS | Status: AC
  Administered 2023-08-21: 2 g via INTRAVENOUS

## 2023-08-21 MED ORDER — POVIDONE-IODINE 10 % EX SWAB
2.0000 | Freq: Once | CUTANEOUS | Status: AC
  Administered 2023-08-21: 2 via TOPICAL

## 2023-08-21 MED ORDER — SODIUM CHLORIDE (PF) 0.9 % IJ SOLN
INTRAMUSCULAR | Status: AC
  Filled 2023-08-21: qty 10

## 2023-08-21 MED ORDER — ENOXAPARIN SODIUM 40 MG/0.4ML IJ SOSY
40.0000 mg | PREFILLED_SYRINGE | INTRAMUSCULAR | Status: DC
  Administered 2023-08-22 – 2023-08-24 (×3): 40 mg via SUBCUTANEOUS
  Filled 2023-08-21 (×3): qty 0.4

## 2023-08-21 MED ORDER — ACETAMINOPHEN 10 MG/ML IV SOLN: 1000.0000 mg | Freq: Once | INTRAVENOUS | Status: DC | PRN

## 2023-08-21 MED ORDER — DIPHENHYDRAMINE HCL 12.5 MG/5ML PO ELIX: 12.5000 mg | ORAL_SOLUTION | Freq: Four times a day (QID) | ORAL | Status: DC | PRN

## 2023-08-21 MED ORDER — SODIUM CHLORIDE 0.45 % IV SOLN: INTRAVENOUS | Status: DC

## 2023-08-21 MED ORDER — ROCURONIUM BROMIDE 100 MG/10ML IV SOLN
INTRAVENOUS | Status: DC | PRN
  Administered 2023-08-21: 20 mg via INTRAVENOUS
  Administered 2023-08-21: 50 mg via INTRAVENOUS

## 2023-08-21 MED ORDER — LIDOCAINE HCL (CARDIAC) PF 100 MG/5ML IV SOSY
PREFILLED_SYRINGE | INTRAVENOUS | Status: DC | PRN
  Administered 2023-08-21: 80 mg via INTRAVENOUS

## 2023-08-21 MED ORDER — ACETAMINOPHEN 10 MG/ML IV SOLN
INTRAVENOUS | Status: AC
  Filled 2023-08-21: qty 100

## 2023-08-21 MED ORDER — MIDAZOLAM HCL 2 MG/2ML IJ SOLN
INTRAMUSCULAR | Status: AC
  Filled 2023-08-21: qty 2

## 2023-08-21 MED ORDER — ONDANSETRON HCL 4 MG/2ML IJ SOLN
INTRAMUSCULAR | Status: DC | PRN
  Administered 2023-08-21: 4 mg via INTRAVENOUS

## 2023-08-21 MED ORDER — KETOROLAC TROMETHAMINE 30 MG/ML IJ SOLN: 30.0000 mg | Freq: Three times a day (TID) | INTRAMUSCULAR | Status: DC | PRN

## 2023-08-21 MED ORDER — ONDANSETRON HCL 4 MG/2ML IJ SOLN
4.0000 mg | Freq: Four times a day (QID) | INTRAMUSCULAR | Status: DC | PRN
  Administered 2023-08-22: 4 mg via INTRAVENOUS
  Filled 2023-08-21: qty 2

## 2023-08-21 MED ORDER — SUGAMMADEX SODIUM 200 MG/2ML IV SOLN
INTRAVENOUS | Status: DC | PRN
  Administered 2023-08-21: 200 mg via INTRAVENOUS

## 2023-08-21 MED ORDER — ONDANSETRON HCL 4 MG/2ML IJ SOLN: 4.0000 mg | Freq: Once | INTRAMUSCULAR | Status: DC | PRN

## 2023-08-21 MED ORDER — STERILE WATER FOR IRRIGATION IR SOLN
Status: DC | PRN
Start: 2023-08-21 — End: 2023-08-21
  Administered 2023-08-21: 600 mL

## 2023-08-21 MED ORDER — INSULIN ASPART 100 UNIT/ML IJ SOLN: 4.0000 [IU] | Freq: Three times a day (TID) | INTRAMUSCULAR | Status: DC

## 2023-08-21 MED ORDER — FENTANYL CITRATE (PF) 100 MCG/2ML IJ SOLN
INTRAMUSCULAR | Status: DC | PRN
  Administered 2023-08-21 (×3): 50 ug via INTRAVENOUS

## 2023-08-21 SURGICAL SUPPLY — 44 items
CATH FOLEY SIL 2WAY 12FR5CC (CATHETERS) IMPLANT
CHLORAPREP W/TINT 26 (MISCELLANEOUS) ×2 IMPLANT
CNTNR URN SCR LID CUP LEK RST (MISCELLANEOUS) ×2 IMPLANT
DRAPE LAP W/FLUID (DRAPES) ×2 IMPLANT
DRAPE UNDER BUTTOCK W/FLU (DRAPES) ×2 IMPLANT
DRSG TEGADERM 4X10 (GAUZE/BANDAGES/DRESSINGS) IMPLANT
DRSG TELFA 3X8 NADH STRL (GAUZE/BANDAGES/DRESSINGS) ×2 IMPLANT
ELECT BLADE 6.5 EXT (BLADE) IMPLANT
ELECT REM PT RETURN 9FT ADLT (ELECTROSURGICAL) ×2 IMPLANT
ELECTRODE REM PT RTRN 9FT ADLT (ELECTROSURGICAL) ×2 IMPLANT
GAUZE 4X4 16PLY ~~LOC~~+RFID DBL (SPONGE) ×4 IMPLANT
GAUZE SPONGE 4X4 12PLY STRL (GAUZE/BANDAGES/DRESSINGS) ×2 IMPLANT
GLOVE BIO SURGEON STRL SZ7 (GLOVE) ×2 IMPLANT
GLOVE BIOGEL PI IND STRL 6.5 (GLOVE) ×2 IMPLANT
GLOVE SURG SYN 8.0 (GLOVE) ×4 IMPLANT
GLOVE SURG SYN 8.0 PF PI (GLOVE) ×2 IMPLANT
GOWN STRL REUS W/ TWL LRG LVL3 (GOWN DISPOSABLE) ×4 IMPLANT
GOWN STRL REUS W/ TWL XL LVL3 (GOWN DISPOSABLE) ×2 IMPLANT
KIT TURNOVER CYSTO (KITS) ×2 IMPLANT
LABEL OR SOLS (LABEL) ×2 IMPLANT
MANIFOLD NEPTUNE II (INSTRUMENTS) ×2 IMPLANT
NDL HYPO 22X1.5 SAFETY MO (MISCELLANEOUS) ×4 IMPLANT
NEEDLE HYPO 22X1.5 SAFETY MO (MISCELLANEOUS) ×4 IMPLANT
PACK BASIN MAJOR ARMC (MISCELLANEOUS) ×2 IMPLANT
RETAINER VISCERA MED (MISCELLANEOUS) IMPLANT
SET CYSTO W/LG BORE CLAMP LF (SET/KITS/TRAYS/PACK) IMPLANT
SOL PREP PVP 2OZ (MISCELLANEOUS) ×4 IMPLANT
SOLUTION PREP PVP 2OZ (MISCELLANEOUS) ×2 IMPLANT
SPONGE T-LAP 18X18 ~~LOC~~+RFID (SPONGE) ×4 IMPLANT
STAPLER INSORB 30 2030 C-SECTI (MISCELLANEOUS) IMPLANT
STAPLER SKIN PROX 35W (STAPLE) IMPLANT
SURGILUBE 2OZ TUBE FLIPTOP (MISCELLANEOUS) ×2 IMPLANT
SUT CHROMIC 2 0 CT 1 (SUTURE) IMPLANT
SUT PDS AB 1 TP1 96 (SUTURE) IMPLANT
SUT VIC AB 0 CT1 27XCR 8 STRN (SUTURE) ×4 IMPLANT
SUT VIC AB 0 CT1 36 (SUTURE) ×4 IMPLANT
SUT VIC AB 2-0 SH 27XBRD (SUTURE) IMPLANT
SYR 20ML LL LF (SYRINGE) ×4 IMPLANT
SYR 30ML LL (SYRINGE) ×2 IMPLANT
SYR BULB IRRIG 60ML STRL (SYRINGE) ×2 IMPLANT
TRAP FLUID SMOKE EVACUATOR (MISCELLANEOUS) ×2 IMPLANT
TRAY FOLEY MTR SLVR 16FR STAT (SET/KITS/TRAYS/PACK) ×2 IMPLANT
WATER STERILE IRR 1000ML POUR (IV SOLUTION) ×2 IMPLANT
WATER STERILE IRR 500ML POUR (IV SOLUTION) ×2 IMPLANT

## 2023-08-21 NOTE — Progress Notes (Signed)
 IV consult placed for second PIV.  ALL IV meds are compatible, to salvage veins, Huston Foley RN notified no need to place second PIV.  Please notify IV team if IV needs change.

## 2023-08-21 NOTE — Progress Notes (Signed)
 Pt ready for TAh , bilateral salpingectomy  Labs reviewed . All questions answered . HCG neg . Glucose nl .  Proceed

## 2023-08-21 NOTE — Anesthesia Postprocedure Evaluation (Signed)
 Anesthesia Post Note  Patient: Mary Velasquez  Procedure(s) Performed: HYSTERECTOMY ABDOMINAL WITH SALPINGECTOMY (Bilateral: Abdomen) CYSTOSCOPY  Patient location during evaluation: PACU Anesthesia Type: General Level of consciousness: awake and alert Pain management: pain level controlled Vital Signs Assessment: post-procedure vital signs reviewed and stable Respiratory status: spontaneous breathing, nonlabored ventilation, respiratory function stable and patient connected to nasal cannula oxygen Cardiovascular status: blood pressure returned to baseline and stable Postop Assessment: no apparent nausea or vomiting Anesthetic complications: no   No notable events documented.   Last Vitals:  Vitals:   08/21/23 1209 08/21/23 1253  BP: (!) 171/112   Pulse:    Resp: 20 (P) 19  Temp: 36.7 C   SpO2: 94%     Last Pain:  Vitals:   08/21/23 1253  TempSrc:   PainSc: (P) 7                  Cleda Mccreedy Mikell Kazlauskas

## 2023-08-21 NOTE — Brief Op Note (Signed)
 08/21/2023  10:26 AM  PATIENT:  [name]  43 y.o. female  PRE-OPERATIVE DIAGNOSIS:  menorrhagia, pelvic adhesions  POST-OPERATIVE DIAGNOSIS:  menorrhagia, pelvic adhesions  PROCEDURE:  Procedure(s): HYSTERECTOMY ABDOMINAL WITH SALPINGECTOMY (Bilateral) CYSTOSCOPY (N/A)  SURGEON:  Surgeons and Role:    * Quency Tober, Ihor Austin, MD - Primary    * Damica Gravlin, Festus Holts, MD - Assisting  PHYSICIAN ASSISTANT:   ASSISTANTS: none   ANESTHESIA:   general  EBL:  150 mL IOF 1200 cc, ou 40 cc  BLOOD ADMINISTERED:none  DRAINS: Urinary Catheter (Foley)   LOCAL MEDICATIONS USED:  MARCAINE    and BUPIVICAINE   SPECIMEN:  Source of Specimen:  cervix , uterus and bilateral fallopian tubes  DISPOSITION OF SPECIMEN:  PATHOLOGY  COUNTS:  YES  TOURNIQUET:  * No tourniquets in log *  DICTATION: .Other Dictation: Dictation Number verbal  PLAN OF CARE: Admit to inpatient   PATIENT DISPOSITION:  PACU - hemodynamically stable.   Delay start of Pharmacological VTE agent (>24hrs) due to surgical blood loss or risk of bleeding: not applicable

## 2023-08-21 NOTE — Transfer of Care (Signed)
 Immediate Anesthesia Transfer of Care Note  Patient: Mary Velasquez  Procedure(s) Performed: HYSTERECTOMY ABDOMINAL WITH SALPINGECTOMY (Bilateral) CYSTOSCOPY  Patient Location: PACU  Anesthesia Type:General  Level of Consciousness: sedated, drowsy, and patient cooperative  Airway & Oxygen Therapy: Patient Spontanous Breathing and Patient connected to face mask oxygen  Post-op Assessment: Report given to RN, Post -op Vital signs reviewed and stable, and Patient moving all extremities X 4  Post vital signs: Reviewed and stable  Last Vitals:  Vitals Value Taken Time  BP 143/96 08/21/23 1045  Temp    Pulse 101 08/21/23 1046  Resp 18 08/21/23 1046  SpO2 100 % 08/21/23 1046  Vitals shown include unfiled device data.  Last Pain:  Vitals:   08/21/23 0625  TempSrc: Temporal  PainSc: 0-No pain         Complications: No notable events documented.

## 2023-08-21 NOTE — Progress Notes (Signed)
 Patient awake/alert x4.  Resting appears comfortable, medicated for discomfort as ordered. Discussed elevated bp with anesthesia:  Dr. Randa Ngo, pre-op bp 156/103. Medicated for pain, does not take any  hypertensive medications. Dressing c/d/I, scant amount drainage peri pad.  Per anesthesia  no new orders, ok with bp.

## 2023-08-21 NOTE — Op Note (Signed)
 NAMEANGELLINA, FERDINAND MEDICAL RECORD NO: [id number] ACCOUNT NO: [id number] DATE OF BIRTH: [birth year] FACILITY: [facility] LOCATION: [facility] PHYSICIAN: Suzy Bouchard, MD  Operative Report   DATE OF PROCEDURE: 08/21/2023  PREOPERATIVE DIAGNOSES:  Menorrhagia.  POSTOPERATIVE DIAGNOSES: 1. Menorrhagia. 2. Pelvic adhesive disease.  PROCEDURE: 1.  Total abdominal hysterectomy. 2.  Bilateral salpingectomy. 3.  Cystoscopy.  SURGEON:  Suzy Bouchard, MD  FIRST ASSISTANT:  Kathalene Frames, MD  ANESTHESIA:  General endotracheal anesthesia.  INDICATIONS:  A 43 year old gravida 2, para 2 with a long history of menorrhagia, unresponsive to conservative therapy.  DESCRIPTION OF PROCEDURE:  After adequate general endotracheal anesthesia, the patient was placed in the dorsal supine position with the legs in the [place] stirrups.  The patient's vagina and abdomen were prepped and draped in normal sterile fashion.  Of  note, the patient had healing diabetic ulcers on the mid portion of the abdomen, which were covered with Tegaderm during the procedure.  A timeout was performed.  The patient did receive 2 g of IV Ancef and 500 mg of Flagyl before the procedure for  surgical prophylaxis.  A Pfannenstiel incision was made 2 fingerbreadths above the symphysis pubis.  Sharp dissection was used to identify the fascia and the fascia was opened in the midline and opened in a transverse fashion.  Significant scar tissue  was encountered during this dissection.  The inferior aspect of the fascia was grasped with Kocher clamps and the pyramidalis muscle was dissected free.  The superior aspect of the fascia was grasped with Kocher clamps and the recti muscles were  dissected free.  Entry into the peritoneal cavity was accomplished bluntly.  Initial evaluation revealed the uterus to be densely adherent to the anterior abdominal wall.  Several adhesions were taken down sharply to allow for the  O'Connor-O'Sullivan  retractor to be placed within the patient's abdomen and the bowel was packed cephalad with laparotomy sponges.  Initial dissection entailed dissecting the fundal and mid portion of the anterior uterus from the abdominal wall.  Once the uterus was freed,  the round ligaments were then bilaterally clamped, transected, and suture ligated with 0 Vicryl suture.  Broad ligament incision was made to allow for clamping of the uteroovarian ligament in the cornua of the uterus.  Two clamps were used.  The tissue  was transected and suture ligated with 0 Vicryl suture.  Each fallopian tube was identified, grasped, and the fallopian tube was excised and pedicles were secured with 0 Vicryl suture.  Each broad ligament was then skeletonized to skeletonize the uterine  arteries to the level of the isthmus portion of the uterus.  Heaney-Ballantine clamps were utilized to clamp the uterine arteries, transection, and suture ligated with 0 Vicryl suture.  Bladder flap was taken down sharply.  Good hemostasis was noted at  this point.  Extremely long cervix was then dissected to the level of the vagina with straight Heaney-Ballantine clamps.  Ultimately, the vagina was entered and the cervix with the uterus attached were removed.  The vagina was then closed with  interrupted 0 Vicryl suture.  Good hemostasis was noted.  Peristalsis of the right ureter was identified; however, the left ureter peristalsis could not be identified.  Therefore, cystoscopy was done intraoperatively demonstrating normal urine pluming  from each ureteral ostia.  Foley catheter was replaced and the surgeon regowned and regloved.  All the abdominal packing was taken out as well as the O'Connor-O'Sullivan retractor.  Good hemostasis was noted after copious  irrigation.  Fascia was then  closed with 0 Vicryl suture in a running non-locking fashion.  Two separate sutures were used.  Fascial wedges were injected with a solution of  Exparel, 0.5% Marcaine and normal saline.  Subcutaneous tissues were irrigated and Bovie for hemostasis and  the subcutaneous dead space was closed with a running 2-0 chromic suture.  The skin was reapproximated with Insorb absorbable staples with good cosmetic effect.  Sterile dressing applied.  The patient did receive 30 mg intravenous Toradol at the end of  the procedure.  There were no complications.  ESTIMATED BLOOD LOSS:  150 mL.  INTRAOPERATIVE FLUIDS:  1200 mL.  URINE OUTPUT:  40 mL.  DISPOSITION:  The patient was taken to the recovery room in good condition.   PUS D: 08/21/2023 11:15:54 am T: 08/21/2023 11:38:00 am  JOB: [id number]/ [id number]

## 2023-08-21 NOTE — Inpatient Diabetes Management (Signed)
 Inpatient Diabetes Program Recommendations  AACE/ADA: New Consensus Statement on Inpatient Glycemic Control (2015)  Target Ranges:  Prepandial:   less than 140 mg/dL      Peak postprandial:   less than 180 mg/dL (1-2 hours)      Critically ill patients:  140 - 180 mg/dL   Lab Results  Component Value Date   GLUCAP 154 (H) 08/21/2023   HGBA1C 15.4 (H) 01/15/2023    Review of Glycemic Control  Latest Reference Range & Units 08/21/23 06:21 08/21/23 10:43  Glucose-Capillary 70 - 99 mg/dL 409 (H) 811 (H)  (H): Data is abnormally high Diabetes history: Type 2 DM Outpatient Diabetes medications: Novolog 8 units TID, Lantus 18-50 units at bedtime, Metformin 1000 mg BID Current orders for Inpatient glycemic control: none Decadron 5 mg x 1  Inpatient Diabetes Program Recommendations:    Consider: -Novolog 0-9 units TID & HS - Semglee 10 units every day - Novolog 3 units TID (assuming patient consumes >50% of meals)  Of note, patient improved A1C from>15% down to 9.5% since October of 2024.  Thanks, Lujean Rave, MSN, RNC-OB Diabetes Coordinator [phone] (8a-5p)

## 2023-08-21 NOTE — Progress Notes (Signed)
 DOS tah , BS and cystoscopy . PCA in place pain not totally controlled yet . BP elevated and required IV labetalol Urine output good . Rx for IV labetalol 20 mg q 2 hr for bp >150/90 Start lantus 10 units q hs  and Novalog 3 units tid  Cont CBG q4 and will increase dosing once increase in po intake : A: stable , transient HTN  P: as above

## 2023-08-21 NOTE — Anesthesia Procedure Notes (Signed)
 Procedure Name: Intubation Date/Time: 08/21/2023 7:47 AM  Performed by: Lanell Matar, CRNAPre-anesthesia Checklist: Patient identified, Emergency Drugs available, Suction available and Patient being monitored Patient Re-evaluated:Patient Re-evaluated prior to induction Oxygen Delivery Method: Circle System Utilized Preoxygenation: Pre-oxygenation with 100% oxygen Induction Type: IV induction Ventilation: Mask ventilation without difficulty and Oral airway inserted - appropriate to patient size Laryngoscope Size: McGrath and 4 Grade View: Grade I Tube type: Oral Tube size: 7.0 mm Number of attempts: 1 Airway Equipment and Method: Stylet and Oral airway Placement Confirmation: ETT inserted through vocal cords under direct vision, positive ETCO2 and breath sounds checked- equal and bilateral Secured at: 21 cm Tube secured with: Tape Dental Injury: Teeth and Oropharynx as per pre-operative assessment

## 2023-08-21 NOTE — Progress Notes (Signed)
 RN called Architect at [employer]. Patient was delivered solid food even though RN put in order and note stating clear fluid diet but with diabetic order. RN requested soft foods at this time due to complications with following diet order. RN was told that was fine at this time and to give lantus insulin at time of patient eating food. Orders were also given to keep foley in until 0500 on 2/21.

## 2023-08-22 ENCOUNTER — Encounter: Payer: Self-pay | Admitting: Obstetrics and Gynecology

## 2023-08-22 LAB — BASIC METABOLIC PANEL WITH GFR
Anion gap: 10 (ref 5–15)
BUN: 21 mg/dL — ABNORMAL HIGH (ref 6–20)
CO2: 23 mmol/L (ref 22–32)
Calcium: 7.8 mg/dL — ABNORMAL LOW (ref 8.9–10.3)
Chloride: 98 mmol/L (ref 98–111)
Creatinine, Ser: 1.11 mg/dL — ABNORMAL HIGH (ref 0.44–1.00)
GFR, Estimated: >60 mL/min (ref >60)
Glucose, Bld: 111 mg/dL — ABNORMAL HIGH (ref 70–99)
Potassium: 3.7 mmol/L (ref 3.5–5.1)
Sodium: 131 mmol/L — ABNORMAL LOW (ref 135–145)

## 2023-08-22 LAB — CBC
HCT: 25.7 % — ABNORMAL LOW (ref 36.0–46.0)
Hemoglobin: 8.4 g/dL — ABNORMAL LOW (ref 12.0–15.0)
MCH: 27.4 pg (ref 26.0–34.0)
MCHC: 32.7 g/dL (ref 30.0–36.0)
MCV: 83.7 fL (ref 80.0–100.0)
Platelets: 226 10*3/uL (ref 150–400)
RBC: 3.07 MIL/uL — ABNORMAL LOW (ref 3.87–5.11)
RDW: 14.5 % (ref 11.5–15.5)
WBC: 8 10*3/uL (ref 4.0–10.5)
nRBC: 0 % (ref 0.0–0.2)

## 2023-08-22 LAB — BASIC METABOLIC PANEL
Anion gap: 11 (ref 5–15)
BUN: 21 mg/dL — ABNORMAL HIGH (ref 6–20)
CO2: 22 mmol/L (ref 22–32)
Calcium: 7.6 mg/dL — ABNORMAL LOW (ref 8.9–10.3)
Chloride: 98 mmol/L (ref 98–111)
Creatinine, Ser: 1.61 mg/dL — ABNORMAL HIGH (ref 0.44–1.00)
GFR, Estimated: 41 mL/min — ABNORMAL LOW (ref >60)
Glucose, Bld: 150 mg/dL — ABNORMAL HIGH (ref 70–99)
Potassium: 4.6 mmol/L (ref 3.5–5.1)
Sodium: 131 mmol/L — ABNORMAL LOW (ref 135–145)

## 2023-08-22 LAB — GLUCOSE, CAPILLARY
Glucose-Capillary: 149 mg/dL — ABNORMAL HIGH (ref 70–99)
Glucose-Capillary: 147 mg/dL — ABNORMAL HIGH (ref 70–99)
Glucose-Capillary: 104 mg/dL — ABNORMAL HIGH (ref 70–99)
Glucose-Capillary: 179 mg/dL — ABNORMAL HIGH (ref 70–99)
Glucose-Capillary: 244 mg/dL — ABNORMAL HIGH (ref 70–99)

## 2023-08-22 LAB — SURGICAL PATHOLOGY

## 2023-08-22 MED ORDER — INSULIN GLARGINE-YFGN 100 UNIT/ML ~~LOC~~ SOLN
10.0000 [IU] | Freq: Every day | SUBCUTANEOUS | Status: DC
  Administered 2023-08-22: 10 [IU] via SUBCUTANEOUS
  Filled 2023-08-22: qty 0.1

## 2023-08-22 MED ORDER — HYDROCODONE-ACETAMINOPHEN 5-325 MG PO TABS
1.0000 | ORAL_TABLET | ORAL | Status: DC | PRN
  Administered 2023-08-22 – 2023-08-24 (×8): 2 via ORAL
  Filled 2023-08-22 (×8): qty 2

## 2023-08-22 MED ORDER — SODIUM CHLORIDE 0.45 % IV BOLUS
500.0000 mL | Freq: Once | INTRAVENOUS | Status: AC
  Administered 2023-08-22: 500 mL via INTRAVENOUS

## 2023-08-22 MED ORDER — MORPHINE SULFATE 1 MG/ML IV SOLN PCA
INTRAVENOUS | Status: DC
  Administered 2023-08-22: 2 mg via INTRAVENOUS
  Administered 2023-08-22: 1 mg via INTRAVENOUS

## 2023-08-22 MED ORDER — INSULIN ASPART 100 UNIT/ML IJ SOLN
5.0000 [IU] | Freq: Three times a day (TID) | INTRAMUSCULAR | Status: DC
  Administered 2023-08-22 – 2023-08-24 (×8): 5 [IU] via SUBCUTANEOUS
  Filled 2023-08-22 (×8): qty 1

## 2023-08-22 MED ORDER — GABAPENTIN 300 MG PO CAPS
600.0000 mg | ORAL_CAPSULE | Freq: Every day | ORAL | Status: DC
  Administered 2023-08-22 – 2023-08-23 (×2): 600 mg via ORAL
  Filled 2023-08-22 (×2): qty 2

## 2023-08-22 MED ORDER — METFORMIN HCL 500 MG PO TABS: 1000.0000 mg | ORAL_TABLET | Freq: Two times a day (BID) | ORAL | Status: DC

## 2023-08-22 NOTE — Progress Notes (Signed)
 1 Day Post-Op Procedure(s) (LRB): HYSTERECTOMY ABDOMINAL WITH SALPINGECTOMY (Bilateral) CYSTOSCOPY (N/A) Lower output urine responded to IV fluid challenge  Subjective: Patient reports pain ok . + flatus . Starting back on solid food . Marland Kitchen    Objective: I have reviewed patient's vital signs, intake and output, medications, and labs.  General: alert and cooperative Resp: clear to auscultation bilaterally Cardio: regular rate and rhythm, S1, S2 normal, no murmur, click, rub or gallop GI: incision covered Hypoactive BS  Results for orders placed or performed during the hospital encounter of 08/21/23 (from the past 24 hours)  Glucose, capillary     Status: Abnormal   Collection Time: 08/21/23 10:43 AM  Result Value Ref Range   Glucose-Capillary 154 (H) 70 - 99 mg/dL   Comment 1 Notify RN    Comment 2 Document in Chart   Glucose, capillary     Status: Abnormal   Collection Time: 08/21/23  2:07 PM  Result Value Ref Range   Glucose-Capillary 213 (H) 70 - 99 mg/dL   Comment 1 Notify RN   Glucose, capillary     Status: Abnormal   Collection Time: 08/21/23  6:19 PM  Result Value Ref Range   Glucose-Capillary 205 (H) 70 - 99 mg/dL   Comment 1 Notify RN   Glucose, capillary     Status: Abnormal   Collection Time: 08/21/23  8:42 PM  Result Value Ref Range   Glucose-Capillary 171 (H) 70 - 99 mg/dL  Glucose, capillary     Status: Abnormal   Collection Time: 08/21/23 11:36 PM  Result Value Ref Range   Glucose-Capillary 176 (H) 70 - 99 mg/dL  Glucose, capillary     Status: Abnormal   Collection Time: 08/22/23  4:02 AM  Result Value Ref Range   Glucose-Capillary 149 (H) 70 - 99 mg/dL  CBC     Status: Abnormal   Collection Time: 08/22/23  4:49 AM  Result Value Ref Range   WBC 8.0 4.0 - 10.5 K/uL   RBC 3.07 (L) 3.87 - 5.11 MIL/uL   Hemoglobin 8.4 (L) 12.0 - 15.0 g/dL   HCT 09.8 (L) 11.9 - 14.7 %   MCV 83.7 80.0 - 100.0 fL   MCH 27.4 26.0 - 34.0 pg   MCHC 32.7 30.0 - 36.0 g/dL   RDW  82.9 56.2 - 13.0 %   Platelets 226 150 - 400 K/uL   nRBC 0.0 0.0 - 0.2 %  Basic metabolic panel     Status: Abnormal   Collection Time: 08/22/23  4:49 AM  Result Value Ref Range   Sodium 131 (L) 135 - 145 mmol/L   Potassium 4.6 3.5 - 5.1 mmol/L   Chloride 98 98 - 111 mmol/L   CO2 22 22 - 32 mmol/L   Glucose, Bld 150 (H) 70 - 99 mg/dL   BUN 21 (H) 6 - 20 mg/dL   Creatinine, Ser 8.65 (H) 0.44 - 1.00 mg/dL   Calcium 7.6 (L) 8.9 - 10.3 mg/dL   GFR, Estimated 41 (L) >60 mL/min   Anion gap 11 5 - 15    Assessment: s/p Procedure(s): HYSTERECTOMY ABDOMINAL WITH SALPINGECTOMY (Bilateral) CYSTOSCOPY (N/A):  Increase Cr this am  Plan: Advance diet Encourage ambulation Advance to PO medication Discontinue IV fluids Increasing insulin to 5 novalog with meals . 10units Lantus at bedtime Repeat BMP to assess Cr .  Encourage IS   LOS: 1 day    Suzy Bouchard, MD 08/22/2023, 9:04 AM

## 2023-08-22 NOTE — Progress Notes (Signed)
 Patient ID: Mary Velasquez, female   DOB: 1980/11/08, 43 y.o.   MRN: [id number] Doing well this afternoon . Po intake good . Glucose values good  Cr coming back down  Vss UO adequate  A: stable   P; repeat labs in am  Possible d/c tomorrow

## 2023-08-23 LAB — CBC
HCT: 21.9 % — ABNORMAL LOW (ref 36.0–46.0)
Hemoglobin: 7.2 g/dL — ABNORMAL LOW (ref 12.0–15.0)
MCH: 27.2 pg (ref 26.0–34.0)
MCHC: 32.9 g/dL (ref 30.0–36.0)
MCV: 82.6 fL (ref 80.0–100.0)
Platelets: 203 10*3/uL (ref 150–400)
RBC: 2.65 MIL/uL — ABNORMAL LOW (ref 3.87–5.11)
RDW: 14.1 % (ref 11.5–15.5)
WBC: 7 10*3/uL (ref 4.0–10.5)
nRBC: 0 % (ref 0.0–0.2)

## 2023-08-23 LAB — BASIC METABOLIC PANEL
Anion gap: 4 — ABNORMAL LOW (ref 5–15)
BUN: 18 mg/dL (ref 6–20)
CO2: 25 mmol/L (ref 22–32)
Calcium: 8.1 mg/dL — ABNORMAL LOW (ref 8.9–10.3)
Chloride: 107 mmol/L (ref 98–111)
Creatinine, Ser: 0.75 mg/dL (ref 0.44–1.00)
GFR, Estimated: >60 mL/min (ref >60)
Glucose, Bld: 186 mg/dL — ABNORMAL HIGH (ref 70–99)
Potassium: 4.2 mmol/L (ref 3.5–5.1)
Sodium: 136 mmol/L (ref 135–145)

## 2023-08-23 LAB — PREPARE RBC (CROSSMATCH)

## 2023-08-23 LAB — GLUCOSE, CAPILLARY
Glucose-Capillary: 204 mg/dL — ABNORMAL HIGH (ref 70–99)
Glucose-Capillary: 170 mg/dL — ABNORMAL HIGH (ref 70–99)
Glucose-Capillary: 171 mg/dL — ABNORMAL HIGH (ref 70–99)
Glucose-Capillary: 126 mg/dL — ABNORMAL HIGH (ref 70–99)
Glucose-Capillary: 113 mg/dL — ABNORMAL HIGH (ref 70–99)

## 2023-08-23 MED ORDER — METFORMIN HCL 500 MG PO TABS
500.0000 mg | ORAL_TABLET | Freq: Two times a day (BID) | ORAL | Status: DC
Start: 2023-08-23 — End: 2023-08-24
  Administered 2023-08-23 – 2023-08-24 (×2): 500 mg via ORAL
  Filled 2023-08-23 (×4): qty 1

## 2023-08-23 MED ORDER — SODIUM CHLORIDE 0.9% IV SOLUTION: Freq: Once | INTRAVENOUS | Status: AC

## 2023-08-23 MED ORDER — INSULIN GLARGINE-YFGN 100 UNIT/ML ~~LOC~~ SOLN
15.0000 [IU] | Freq: Every day | SUBCUTANEOUS | Status: DC
  Administered 2023-08-23: 15 [IU] via SUBCUTANEOUS
  Filled 2023-08-23 (×2): qty 0.15

## 2023-08-23 MED ORDER — METOPROLOL SUCCINATE ER 25 MG PO TB24
25.0000 mg | ORAL_TABLET | Freq: Every day | ORAL | Status: DC
  Administered 2023-08-23 – 2023-08-24 (×2): 25 mg via ORAL
  Filled 2023-08-23 (×2): qty 1

## 2023-08-23 NOTE — Discharge Summary (Signed)
 Physician Discharge Summary  Patient ID: Mary Velasquez MRN: [id number] DOB/AGE: 1981/02/28 43 y.o.  Admit date: 08/21/2023 Discharge date: 08/24/23  Admission Diagnoses: menorrhagia Discharge Diagnoses:  Principal Problem:   Postoperative state Active Problems:   Post-operative state   Discharged Condition: good  Hospital Course: Pt underwent a TAH , bilateral salpingectomy  and cystoscopy on 08/21/23. PCA used first 20 hrs and then switched to to po narco.  Glucose values slightly elevated , but stabilized with insulin Pod#1 creatine elevated and normalized by POD#2. Good urine output. Prophylactic Sq heparin administered q 24hr started  POD#1 . PO intake good by POD#1 and ambulating without difficulty . Hbg 7.2 POD#2 required 1 unit blood . POD#3 post transfusion H/H 7.8/24.1 300 mg Venofer given before d/c  Consults: None  Significant Diagnostic Studies: labs:  Results for orders placed or performed during the hospital encounter of 08/21/23 (from the past 72 hours)  Glucose, capillary     Status: Abnormal   Collection Time: 08/21/23  2:07 PM  Result Value Ref Range   Glucose-Capillary 213 (H) 70 - 99 mg/dL    Comment: Glucose reference range applies only to samples taken after fasting for at least 8 hours.   Comment 1 Notify RN   Glucose, capillary     Status: Abnormal   Collection Time: 08/21/23  6:19 PM  Result Value Ref Range   Glucose-Capillary 205 (H) 70 - 99 mg/dL    Comment: Glucose reference range applies only to samples taken after fasting for at least 8 hours.   Comment 1 Notify RN   Glucose, capillary     Status: Abnormal   Collection Time: 08/21/23  8:42 PM  Result Value Ref Range   Glucose-Capillary 171 (H) 70 - 99 mg/dL    Comment: Glucose reference range applies only to samples taken after fasting for at least 8 hours.  Glucose, capillary     Status: Abnormal   Collection Time: 08/21/23 11:36 PM  Result Value Ref Range   Glucose-Capillary 176 (H) 70 - 99  mg/dL    Comment: Glucose reference range applies only to samples taken after fasting for at least 8 hours.  Glucose, capillary     Status: Abnormal   Collection Time: 08/22/23  4:02 AM  Result Value Ref Range   Glucose-Capillary 149 (H) 70 - 99 mg/dL    Comment: Glucose reference range applies only to samples taken after fasting for at least 8 hours.  CBC     Status: Abnormal   Collection Time: 08/22/23  4:49 AM  Result Value Ref Range   WBC 8.0 4.0 - 10.5 K/uL   RBC 3.07 (L) 3.87 - 5.11 MIL/uL   Hemoglobin 8.4 (L) 12.0 - 15.0 g/dL   HCT 40.9 (L) 81.1 - 91.4 %   MCV 83.7 80.0 - 100.0 fL   MCH 27.4 26.0 - 34.0 pg   MCHC 32.7 30.0 - 36.0 g/dL   RDW 78.2 95.6 - 21.3 %   Platelets 226 150 - 400 K/uL   nRBC 0.0 0.0 - 0.2 %    Comment: Performed at [facility], [address] [place], [place] [address]  Basic metabolic panel     Status: Abnormal   Collection Time: 08/22/23  4:49 AM  Result Value Ref Range   Sodium 131 (L) 135 - 145 mmol/L   Potassium 4.6 3.5 - 5.1 mmol/L   Chloride 98 98 - 111 mmol/L   CO2 22 22 - 32 mmol/L  Glucose, Bld 150 (H) 70 - 99 mg/dL    Comment: Glucose reference range applies only to samples taken after fasting for at least 8 hours.   BUN 21 (H) 6 - 20 mg/dL   Creatinine, Ser 1.61 (H) 0.44 - 1.00 mg/dL   Calcium 7.6 (L) 8.9 - 10.3 mg/dL   GFR, Estimated 41 (L) >60 mL/min    Comment: (NOTE) Calculated using the CKD-EPI Creatinine Equation (2021)    Anion gap 11 5 - 15    Comment: Performed at [facility], [address] [place], [place] [address]  Glucose, capillary     Status: Abnormal   Collection Time: 08/22/23  9:48 AM  Result Value Ref Range   Glucose-Capillary 147 (H) 70 - 99 mg/dL    Comment: Glucose reference range applies only to samples taken after fasting for at least 8 hours.  Glucose, capillary     Status: Abnormal   Collection Time: 08/22/23 12:57 PM  Result Value Ref Range   Glucose-Capillary 104 (H) 70 - 99  mg/dL    Comment: Glucose reference range applies only to samples taken after fasting for at least 8 hours.  Basic metabolic panel     Status: Abnormal   Collection Time: 08/22/23  2:33 PM  Result Value Ref Range   Sodium 131 (L) 135 - 145 mmol/L   Potassium 3.7 3.5 - 5.1 mmol/L   Chloride 98 98 - 111 mmol/L   CO2 23 22 - 32 mmol/L   Glucose, Bld 111 (H) 70 - 99 mg/dL    Comment: Glucose reference range applies only to samples taken after fasting for at least 8 hours.   BUN 21 (H) 6 - 20 mg/dL   Creatinine, Ser 5.40 (H) 0.44 - 1.00 mg/dL   Calcium 7.8 (L) 8.9 - 10.3 mg/dL   GFR, Estimated >98 >11 mL/min    Comment: (NOTE) Calculated using the CKD-EPI Creatinine Equation (2021)    Anion gap 10 5 - 15    Comment: Performed at [facility], [address] [place], [place] [address]  Glucose, capillary     Status: Abnormal   Collection Time: 08/22/23  5:59 PM  Result Value Ref Range   Glucose-Capillary 179 (H) 70 - 99 mg/dL    Comment: Glucose reference range applies only to samples taken after fasting for at least 8 hours.  Glucose, capillary     Status: Abnormal   Collection Time: 08/22/23  9:10 PM  Result Value Ref Range   Glucose-Capillary 244 (H) 70 - 99 mg/dL    Comment: Glucose reference range applies only to samples taken after fasting for at least 8 hours.  Glucose, capillary     Status: Abnormal   Collection Time: 08/23/23  3:21 AM  Result Value Ref Range   Glucose-Capillary 204 (H) 70 - 99 mg/dL    Comment: Glucose reference range applies only to samples taken after fasting for at least 8 hours.  Basic metabolic panel     Status: Abnormal   Collection Time: 08/23/23  5:32 AM  Result Value Ref Range   Sodium 136 135 - 145 mmol/L   Potassium 4.2 3.5 - 5.1 mmol/L   Chloride 107 98 - 111 mmol/L   CO2 25 22 - 32 mmol/L   Glucose, Bld 186 (H) 70 - 99 mg/dL    Comment: Glucose reference range applies only to samples taken after fasting for at least 8 hours.    BUN 18 6 - 20 mg/dL  Creatinine, Ser 0.75 0.44 - 1.00 mg/dL   Calcium 8.1 (L) 8.9 - 10.3 mg/dL   GFR, Estimated >19 >14 mL/min    Comment: (NOTE) Calculated using the CKD-EPI Creatinine Equation (2021)    Anion gap 4 (L) 5 - 15    Comment: Performed at [facility], [address] [place], [place] [address]  CBC     Status: Abnormal   Collection Time: 08/23/23  5:32 AM  Result Value Ref Range   WBC 7.0 4.0 - 10.5 K/uL   RBC 2.65 (L) 3.87 - 5.11 MIL/uL   Hemoglobin 7.2 (L) 12.0 - 15.0 g/dL   HCT 62.1 (L) 30.8 - 65.7 %   MCV 82.6 80.0 - 100.0 fL   MCH 27.2 26.0 - 34.0 pg   MCHC 32.9 30.0 - 36.0 g/dL   RDW 84.6 96.2 - 95.2 %   Platelets 203 150 - 400 K/uL   nRBC 0.0 0.0 - 0.2 %    Comment: Performed at [facility], [address] [place], [place] [address]  Glucose, capillary     Status: Abnormal   Collection Time: 08/23/23  8:10 AM  Result Value Ref Range   Glucose-Capillary 170 (H) 70 - 99 mg/dL    Comment: Glucose reference range applies only to samples taken after fasting for at least 8 hours.  Prepare RBC (crossmatch)     Status: None   Collection Time: 08/23/23 11:25 AM  Result Value Ref Range   Order Confirmation      ORDER PROCESSED BY BLOOD BANK Performed at [facility], [address] [place], [place] [address]   Glucose, capillary     Status: Abnormal   Collection Time: 08/23/23 11:54 AM  Result Value Ref Range   Glucose-Capillary 171 (H) 70 - 99 mg/dL    Comment: Glucose reference range applies only to samples taken after fasting for at least 8 hours.  Glucose, capillary     Status: Abnormal   Collection Time: 08/23/23  6:17 PM  Result Value Ref Range   Glucose-Capillary 126 (H) 70 - 99 mg/dL    Comment: Glucose reference range applies only to samples taken after fasting for at least 8 hours.  Glucose, capillary     Status: Abnormal   Collection Time: 08/23/23 10:20 PM  Result Value Ref Range   Glucose-Capillary 113 (H) 70 -  99 mg/dL    Comment: Glucose reference range applies only to samples taken after fasting for at least 8 hours.  CBC     Status: Abnormal   Collection Time: 08/24/23  5:38 AM  Result Value Ref Range   WBC 7.0 4.0 - 10.5 K/uL   RBC 2.84 (L) 3.87 - 5.11 MIL/uL   Hemoglobin 7.8 (L) 12.0 - 15.0 g/dL   HCT 27.2 (L) 53.6 - 64.4 %   MCV 84.9 80.0 - 100.0 fL   MCH 27.5 26.0 - 34.0 pg   MCHC 32.4 30.0 - 36.0 g/dL   RDW 03.4 74.2 - 59.5 %   Platelets 207 150 - 400 K/uL   nRBC 0.0 0.0 - 0.2 %    Comment: Performed at [facility], [address] [place], [place] [address]  Glucose, capillary     Status: Abnormal   Collection Time: 08/24/23  8:36 AM  Result Value Ref Range   Glucose-Capillary 155 (H) 70 - 99 mg/dL    Comment: Glucose reference range applies only to samples taken after fasting for at least 8 hours.  Glucose, capillary     Status: Abnormal   Collection Time: 08/24/23 12:27 PM  Result Value Ref Range   Glucose-Capillary 58 (L) 70 - 99 mg/dL    Comment: Glucose reference range applies only to samples taken after fasting for at least 8 hours.     Treatments: surgery: as above   Discharge Exam: Blood pressure (!) 144/89, pulse 100, temperature 98.3 F (36.8 C), temperature source Oral, resp. rate 18, height 5' (1.524 m), weight 81.6 kg, SpO2 95%. Lungs CTA   CV RRR without murmur Abd: soft non distended , + BS , No rebounf   Incision covered opsite , clean and intact   Disposition: Discharge disposition: 01-Home or Self Care      Home  RTC [facility] in 2 weeks  Discharge Instructions     Call MD for:   Complete by: As directed    Heavy vaginal bleeding   Call MD for:  difficulty breathing, headache or visual disturbances   Complete by: As directed    Call MD for:  extreme fatigue   Complete by: As directed    Call MD for:  hives   Complete by: As directed    Call MD for:  persistant dizziness or light-headedness   Complete by: As directed    Call MD for:   persistant nausea and vomiting   Complete by: As directed    Call MD for:  redness, tenderness, or signs of infection (pain, swelling, redness, odor or green/yellow discharge around incision site)   Complete by: As directed    Call MD for:  severe uncontrolled pain   Complete by: As directed    Call MD for:  temperature >100.4   Complete by: As directed    Diet Carb Modified   Complete by: As directed    Increase activity slowly   Complete by: As directed       Allergies as of 08/24/2023       Reactions   Oxycodone Anaphylaxis, Swelling   Throat swelling   Oxycontin [oxycodone Hcl] Anaphylaxis, Swelling   Throat swelling        Medication List     TAKE these medications    Blood Glucose Monitoring Suppl Devi 1 each by Does not apply route 3 (three) times daily. May dispense any manufacturer covered by patient's insurance.   BLOOD GLUCOSE TEST STRIPS Strp 1 each by Does not apply route 3 (three) times daily. Use as directed to check blood sugar. May dispense any manufacturer covered by patient's insurance and fits patient's device.   ferrous sulfate 325 (65 FE) MG EC tablet Take 1 tablet (325 mg total) by mouth 2 (two) times daily.   gabapentin 300 MG capsule Commonly known as: NEURONTIN Take 600 mg by mouth at bedtime.   insulin aspart 100 UNIT/ML FlexPen Commonly known as: NOVOLOG Inject 4 Units into the skin 3 (three) times daily with meals. Only take if eating a meal AND Blood Glucose (BG) is 80 or higher. What changed: how much to take   insulin glargine 100 UNIT/ML Solostar Pen Commonly known as: LANTUS Inject 14 Units into the skin at bedtime. May substitute as needed per insurance. What changed: how much to take   iron sucrose Commonly known as: VENOFER Inject 265 mLs (300 mg total) into the vein once for 1 dose.   Lancet Device Misc 1 each by Does not apply route 3 (three) times daily. May dispense any manufacturer covered by patient's insurance.  Lancets Misc 1 each by Does not apply route 3 (three) times daily. Use as directed to check blood sugar. May dispense any manufacturer covered by patient's insurance and fits patient's device.   metFORMIN 1000 MG tablet Commonly known as: GLUCOPHAGE Take 1,000 mg by mouth 2 (two) times daily with a meal. Breakfast & supper   norethindrone-ethinyl estradiol 1/35 tablet Commonly known as: ORTHO-NOVUM Take 1 pill 3 times per day for 3 days, then 1 pill 2 times per day for 2 days, then 1 pill daily until you see your outpatient gynecologist.   nortriptyline 50 MG capsule Commonly known as: PAMELOR Take 100 mg by mouth at bedtime.   Pen Needles 31G X 5 MM Misc 1 each by Does not apply route 3 (three) times daily. May dispense any manufacturer covered by patient's insurance.        Follow-up Information     Reveca Desmarais, Ihor Austin, MD. Go to.   Specialty: Obstetrics and Gynecology Contact information: [address] [place] [address] [phone]         Bhakti Labella, Ihor Austin, MD Follow up in 2 week(s).   Specialty: Obstetrics and Gynecology Why: already set up Contact information: [address] [place] [address] [phone]                 Signed: Ihor Austin Thurl Boen 08/24/2023, 12:42 PM

## 2023-08-23 NOTE — Progress Notes (Signed)
 Patient's CBG- 113, verified by RN at 2220. Machine would not flow over.   Will give bedtime dose of Semglee 25 units.

## 2023-08-23 NOTE — Progress Notes (Signed)
 2 Days Post-Op Procedure(s) (LRB): HYSTERECTOMY ABDOMINAL WITH SALPINGECTOMY (Bilateral) CYSTOSCOPY (N/A)  Subjective: Patient reports some dizziness yesterday with ambulation .  No N/V . Po intake good . + flatus . No vaginal bleeding .   Eating abit less than she would at home   Objective: I have reviewed patient's vital signs, intake and output, medications, and labs.  General: alert and cooperative Resp: clear to auscultation bilaterally Cardio: regular rate and rhythm, S1, S2 normal, no murmur, click, rub or gallop GI: soft, non-tender; bowel sounds normal; no masses,  no organomegaly Vaginal Bleeding: none Mild tachycardia noted  Results for orders placed or performed during the hospital encounter of 08/21/23 (from the past 24 hours)  Glucose, capillary     Status: Abnormal   Collection Time: 08/22/23 12:57 PM  Result Value Ref Range   Glucose-Capillary 104 (H) 70 - 99 mg/dL  Basic metabolic panel     Status: Abnormal   Collection Time: 08/22/23  2:33 PM  Result Value Ref Range   Sodium 131 (L) 135 - 145 mmol/L   Potassium 3.7 3.5 - 5.1 mmol/L   Chloride 98 98 - 111 mmol/L   CO2 23 22 - 32 mmol/L   Glucose, Bld 111 (H) 70 - 99 mg/dL   BUN 21 (H) 6 - 20 mg/dL   Creatinine, Ser 1.61 (H) 0.44 - 1.00 mg/dL   Calcium 7.8 (L) 8.9 - 10.3 mg/dL   GFR, Estimated >09 >60 mL/min   Anion gap 10 5 - 15  Glucose, capillary     Status: Abnormal   Collection Time: 08/22/23  5:59 PM  Result Value Ref Range   Glucose-Capillary 179 (H) 70 - 99 mg/dL  Glucose, capillary     Status: Abnormal   Collection Time: 08/22/23  9:10 PM  Result Value Ref Range   Glucose-Capillary 244 (H) 70 - 99 mg/dL  Glucose, capillary     Status: Abnormal   Collection Time: 08/23/23  3:21 AM  Result Value Ref Range   Glucose-Capillary 204 (H) 70 - 99 mg/dL  Basic metabolic panel     Status: Abnormal   Collection Time: 08/23/23  5:32 AM  Result Value Ref Range   Sodium 136 135 - 145 mmol/L   Potassium  4.2 3.5 - 5.1 mmol/L   Chloride 107 98 - 111 mmol/L   CO2 25 22 - 32 mmol/L   Glucose, Bld 186 (H) 70 - 99 mg/dL   BUN 18 6 - 20 mg/dL   Creatinine, Ser 4.54 0.44 - 1.00 mg/dL   Calcium 8.1 (L) 8.9 - 10.3 mg/dL   GFR, Estimated >09 >81 mL/min   Anion gap 4 (L) 5 - 15  CBC     Status: Abnormal   Collection Time: 08/23/23  5:32 AM  Result Value Ref Range   WBC 7.0 4.0 - 10.5 K/uL   RBC 2.65 (L) 3.87 - 5.11 MIL/uL   Hemoglobin 7.2 (L) 12.0 - 15.0 g/dL   HCT 19.1 (L) 47.8 - 29.5 %   MCV 82.6 80.0 - 100.0 fL   MCH 27.2 26.0 - 34.0 pg   MCHC 32.9 30.0 - 36.0 g/dL   RDW 62.1 30.8 - 65.7 %   Platelets 203 150 - 400 K/uL   nRBC 0.0 0.0 - 0.2 %  Glucose, capillary     Status: Abnormal   Collection Time: 08/23/23  8:10 AM  Result Value Ref Range   Glucose-Capillary 170 (H) 70 - 99 mg/dL  Prepare RBC (crossmatch)  Status: None   Collection Time: 08/23/23 11:25 AM  Result Value Ref Range   Order Confirmation      ORDER PROCESSED BY BLOOD BANK Performed at [facility], [address] [place], [place] [address]     Assessment: s/p Procedure(s): HYSTERECTOMY ABDOMINAL WITH SALPINGECTOMY (Bilateral) CYSTOSCOPY (N/A): stable Anemia , doubt intraabd bleeding given exam and good urine output . Probable dilutional and fluid shifts Plan: Transfuse 1 unit blood Increase lantus at bedtime doing to 15 units Continue 5 U novalog q meals . Add metformin 500 mg BID  Add Toprol Xl 25 mg every day  Repeat CBC in am .   LOS: 2 days    Suzy Bouchard, MD 08/23/2023, 11:44 AM

## 2023-08-23 NOTE — Progress Notes (Signed)
 Pt ambulating around room very well. Per pt voiding is "back to her usual". Her pain is well controlled.  CBG 244 at 2110. Bedtime dose of 10 units of Semglee given at 2157. No orders to call about CBG number.  Will respect sleep and then recheck vitals and CBG at 0300.   Explained to pt labs would be redrawn this morning around 0500.

## 2023-08-24 LAB — TYPE AND SCREEN
ABO/RH(D): B POS
Antibody Screen: NEGATIVE
Unit division: 0

## 2023-08-24 LAB — CBC
HCT: 24.1 % — ABNORMAL LOW (ref 36.0–46.0)
Hemoglobin: 7.8 g/dL — ABNORMAL LOW (ref 12.0–15.0)
MCH: 27.5 pg (ref 26.0–34.0)
MCHC: 32.4 g/dL (ref 30.0–36.0)
MCV: 84.9 fL (ref 80.0–100.0)
Platelets: 207 10*3/uL (ref 150–400)
RBC: 2.84 MIL/uL — ABNORMAL LOW (ref 3.87–5.11)
RDW: 14.4 % (ref 11.5–15.5)
WBC: 7 10*3/uL (ref 4.0–10.5)
nRBC: 0 % (ref 0.0–0.2)

## 2023-08-24 LAB — BPAM RBC
Blood Product Expiration Date: 202503192359
ISSUE DATE / TIME: 202502221224
Unit Type and Rh: 7300

## 2023-08-24 LAB — GLUCOSE, CAPILLARY
Glucose-Capillary: 155 mg/dL — ABNORMAL HIGH (ref 70–99)
Glucose-Capillary: 58 mg/dL — ABNORMAL LOW (ref 70–99)

## 2023-08-24 MED ORDER — FERROUS SULFATE 325 (65 FE) MG PO TBEC
325.0000 mg | DELAYED_RELEASE_TABLET | Freq: Two times a day (BID) | ORAL | 3 refills | Status: AC
Start: 2023-08-24 — End: 2024-08-23

## 2023-08-24 MED ORDER — IRON SUCROSE 300 MG IVPB - SIMPLE MED: 300.0000 mg | Freq: Once | 0 refills | Status: DC

## 2023-08-24 MED ORDER — IRON SUCROSE 300 MG IVPB - SIMPLE MED
300.0000 mg | Freq: Once | Status: AC
  Administered 2023-08-24: 300 mg via INTRAVENOUS
  Filled 2023-08-24: qty 300

## 2023-10-06 ENCOUNTER — Other Ambulatory Visit: Payer: Self-pay

## 2023-10-06 ENCOUNTER — Inpatient Hospital Stay
Admission: EM | Admit: 2023-10-06 | Discharge: 2023-10-12 | DRG: 287 | Disposition: A | Discharge status: Home / Self Care | Attending: Internal Medicine | Admitting: Internal Medicine

## 2023-10-06 ENCOUNTER — Observation Stay

## 2023-10-06 ENCOUNTER — Emergency Department

## 2023-10-06 ENCOUNTER — Encounter: Payer: Self-pay | Admitting: Emergency Medicine

## 2023-10-06 DIAGNOSIS — I5021 Acute systolic (congestive) heart failure: Secondary | ICD-10-CM | POA: Diagnosis not present

## 2023-10-06 DIAGNOSIS — I428 Other cardiomyopathies: Secondary | ICD-10-CM

## 2023-10-06 DIAGNOSIS — Z794 Long term (current) use of insulin: Secondary | ICD-10-CM

## 2023-10-06 DIAGNOSIS — R9389 Abnormal findings on diagnostic imaging of other specified body structures: Secondary | ICD-10-CM

## 2023-10-06 DIAGNOSIS — I420 Dilated cardiomyopathy: Secondary | ICD-10-CM | POA: Diagnosis present

## 2023-10-06 DIAGNOSIS — R296 Repeated falls: Secondary | ICD-10-CM | POA: Diagnosis present

## 2023-10-06 DIAGNOSIS — E876 Hypokalemia: Secondary | ICD-10-CM | POA: Diagnosis not present

## 2023-10-06 DIAGNOSIS — R270 Ataxia, unspecified: Secondary | ICD-10-CM

## 2023-10-06 DIAGNOSIS — Z6831 Body mass index (BMI) 31.0-31.9, adult: Secondary | ICD-10-CM

## 2023-10-06 DIAGNOSIS — R000 Tachycardia, unspecified: Secondary | ICD-10-CM | POA: Diagnosis present

## 2023-10-06 DIAGNOSIS — I3139 Other pericardial effusion (noninflammatory): Secondary | ICD-10-CM | POA: Diagnosis present

## 2023-10-06 DIAGNOSIS — D649 Anemia, unspecified: Secondary | ICD-10-CM | POA: Diagnosis present

## 2023-10-06 DIAGNOSIS — R55 Syncope and collapse: Secondary | ICD-10-CM | POA: Diagnosis not present

## 2023-10-06 DIAGNOSIS — E873 Alkalosis: Secondary | ICD-10-CM | POA: Diagnosis not present

## 2023-10-06 DIAGNOSIS — E1149 Type 2 diabetes mellitus with other diabetic neurological complication: Secondary | ICD-10-CM | POA: Diagnosis present

## 2023-10-06 DIAGNOSIS — I5A Non-ischemic myocardial injury (non-traumatic): Secondary | ICD-10-CM | POA: Diagnosis present

## 2023-10-06 DIAGNOSIS — E1142 Type 2 diabetes mellitus with diabetic polyneuropathy: Secondary | ICD-10-CM | POA: Diagnosis present

## 2023-10-06 DIAGNOSIS — I2489 Other forms of acute ischemic heart disease: Secondary | ICD-10-CM | POA: Diagnosis present

## 2023-10-06 DIAGNOSIS — I272 Pulmonary hypertension, unspecified: Secondary | ICD-10-CM | POA: Diagnosis present

## 2023-10-06 DIAGNOSIS — R7989 Other specified abnormal findings of blood chemistry: Secondary | ICD-10-CM | POA: Diagnosis not present

## 2023-10-06 DIAGNOSIS — E669 Obesity, unspecified: Secondary | ICD-10-CM | POA: Diagnosis present

## 2023-10-06 DIAGNOSIS — Z5986 Financial insecurity: Secondary | ICD-10-CM

## 2023-10-06 DIAGNOSIS — Z8249 Family history of ischemic heart disease and other diseases of the circulatory system: Secondary | ICD-10-CM

## 2023-10-06 DIAGNOSIS — Z885 Allergy status to narcotic agent status: Secondary | ICD-10-CM

## 2023-10-06 DIAGNOSIS — R0902 Hypoxemia: Secondary | ICD-10-CM | POA: Diagnosis present

## 2023-10-06 DIAGNOSIS — I4711 Inappropriate sinus tachycardia, so stated: Secondary | ICD-10-CM

## 2023-10-06 DIAGNOSIS — Z7984 Long term (current) use of oral hypoglycemic drugs: Secondary | ICD-10-CM

## 2023-10-06 DIAGNOSIS — E66811 Obesity, class 1: Secondary | ICD-10-CM | POA: Diagnosis present

## 2023-10-06 DIAGNOSIS — Z79899 Other long term (current) drug therapy: Secondary | ICD-10-CM

## 2023-10-06 LAB — CBC WITH DIFFERENTIAL/PLATELET
Abs Immature Granulocytes: 0.03 10*3/uL (ref 0.00–0.07)
Basophils Absolute: 0 10*3/uL (ref 0.0–0.1)
Basophils Relative: 0 %
Eosinophils Absolute: 0 10*3/uL (ref 0.0–0.5)
Eosinophils Relative: 0 %
HCT: 33.1 % — ABNORMAL LOW (ref 36.0–46.0)
Hemoglobin: 10.6 g/dL — ABNORMAL LOW (ref 12.0–15.0)
Immature Granulocytes: 0 %
Lymphocytes Relative: 11 %
Lymphs Abs: 1 10*3/uL (ref 0.7–4.0)
MCH: 27.7 pg (ref 26.0–34.0)
MCHC: 32 g/dL (ref 30.0–36.0)
MCV: 86.6 fL (ref 80.0–100.0)
Monocytes Absolute: 0.5 10*3/uL (ref 0.1–1.0)
Monocytes Relative: 6 %
Neutro Abs: 7.4 10*3/uL (ref 1.7–7.7)
Neutrophils Relative %: 83 %
Platelets: 299 10*3/uL (ref 150–400)
RBC: 3.82 MIL/uL — ABNORMAL LOW (ref 3.87–5.11)
RDW: 13.5 % (ref 11.5–15.5)
WBC: 9 10*3/uL (ref 4.0–10.5)
nRBC: 0 % (ref 0.0–0.2)

## 2023-10-06 LAB — BASIC METABOLIC PANEL WITH GFR
Anion gap: 8 (ref 5–15)
BUN: 15 mg/dL (ref 6–20)
CO2: 26 mmol/L (ref 22–32)
Calcium: 8.1 mg/dL — ABNORMAL LOW (ref 8.9–10.3)
Chloride: 99 mmol/L (ref 98–111)
Creatinine, Ser: 1.12 mg/dL — ABNORMAL HIGH (ref 0.44–1.00)
GFR, Estimated: >60 mL/min (ref >60)
Glucose, Bld: 162 mg/dL — ABNORMAL HIGH (ref 70–99)
Potassium: 4.4 mmol/L (ref 3.5–5.1)
Sodium: 133 mmol/L — ABNORMAL LOW (ref 135–145)

## 2023-10-06 LAB — D-DIMER, QUANTITATIVE: D-Dimer, Quant: 0.84 ug{FEU}/mL — ABNORMAL HIGH (ref 0.00–0.50)

## 2023-10-06 LAB — TROPONIN I (HIGH SENSITIVITY)
Troponin I (High Sensitivity): 33 ng/L — ABNORMAL HIGH (ref <18)
Troponin I (High Sensitivity): 35 ng/L — ABNORMAL HIGH (ref <18)

## 2023-10-06 LAB — BRAIN NATRIURETIC PEPTIDE: B Natriuretic Peptide: 235 pg/mL — ABNORMAL HIGH (ref 0.0–100.0)

## 2023-10-06 LAB — TYPE AND SCREEN
ABO/RH(D): B POS
Antibody Screen: NEGATIVE

## 2023-10-06 LAB — HCG, QUANTITATIVE, PREGNANCY: hCG, Beta Chain, Quant, S: 1 m[IU]/mL (ref <5)

## 2023-10-06 LAB — CBG MONITORING, ED: Glucose-Capillary: 175 mg/dL — ABNORMAL HIGH (ref 70–99)

## 2023-10-06 MED ORDER — INSULIN ASPART 100 UNIT/ML IJ SOLN
0.0000 [IU] | Freq: Three times a day (TID) | INTRAMUSCULAR | Status: DC
  Administered 2023-10-07 (×2): 3 [IU] via SUBCUTANEOUS
  Administered 2023-10-08: 5 [IU] via SUBCUTANEOUS
  Administered 2023-10-08 – 2023-10-09 (×2): 3 [IU] via SUBCUTANEOUS
  Filled 2023-10-06 (×6): qty 1

## 2023-10-06 MED ORDER — GABAPENTIN 300 MG PO CAPS
600.0000 mg | ORAL_CAPSULE | Freq: Every day | ORAL | Status: DC
  Administered 2023-10-06 – 2023-10-11 (×6): 600 mg via ORAL
  Filled 2023-10-06 (×6): qty 2

## 2023-10-06 MED ORDER — ACETAMINOPHEN 650 MG RE SUPP: 650.0000 mg | Freq: Four times a day (QID) | RECTAL | Status: DC | PRN

## 2023-10-06 MED ORDER — FERROUS SULFATE 325 (65 FE) MG PO TABS
325.0000 mg | ORAL_TABLET | Freq: Every day | ORAL | Status: DC
  Administered 2023-10-07 – 2023-10-12 (×5): 325 mg via ORAL
  Filled 2023-10-06 (×5): qty 1

## 2023-10-06 MED ORDER — IOHEXOL 350 MG/ML SOLN
100.0000 mL | Freq: Once | INTRAVENOUS | Status: AC | PRN
  Administered 2023-10-06: 100 mL via INTRAVENOUS

## 2023-10-06 MED ORDER — INSULIN ASPART 100 UNIT/ML IJ SOLN: 0.0000 [IU] | Freq: Three times a day (TID) | INTRAMUSCULAR | Status: DC

## 2023-10-06 MED ORDER — SODIUM CHLORIDE 0.9% FLUSH
3.0000 mL | Freq: Two times a day (BID) | INTRAVENOUS | Status: DC
  Administered 2023-10-07 – 2023-10-12 (×10): 3 mL via INTRAVENOUS

## 2023-10-06 MED ORDER — POLYETHYLENE GLYCOL 3350 17 G PO PACK: 17.0000 g | PACK | Freq: Every day | ORAL | Status: DC | PRN

## 2023-10-06 MED ORDER — ENOXAPARIN SODIUM 40 MG/0.4ML IJ SOSY
40.0000 mg | PREFILLED_SYRINGE | INTRAMUSCULAR | Status: DC
  Administered 2023-10-06 – 2023-10-11 (×5): 40 mg via SUBCUTANEOUS
  Filled 2023-10-06 (×6): qty 0.4

## 2023-10-06 MED ORDER — ASPIRIN 81 MG PO CHEW
324.0000 mg | CHEWABLE_TABLET | Freq: Once | ORAL | Status: AC
  Administered 2023-10-06: 324 mg via ORAL
  Filled 2023-10-06: qty 4

## 2023-10-06 MED ORDER — MECLIZINE HCL 25 MG PO TABS
25.0000 mg | ORAL_TABLET | Freq: Once | ORAL | Status: AC
  Administered 2023-10-06: 25 mg via ORAL
  Filled 2023-10-06: qty 1

## 2023-10-06 MED ORDER — ONDANSETRON HCL 4 MG/2ML IJ SOLN
4.0000 mg | Freq: Once | INTRAMUSCULAR | Status: AC
  Administered 2023-10-06: 4 mg via INTRAVENOUS
  Filled 2023-10-06: qty 2

## 2023-10-06 MED ORDER — ONDANSETRON HCL 4 MG/2ML IJ SOLN: 4.0000 mg | Freq: Four times a day (QID) | INTRAMUSCULAR | Status: DC | PRN

## 2023-10-06 MED ORDER — LACTATED RINGERS IV BOLUS
1000.0000 mL | Freq: Once | INTRAVENOUS | Status: AC
  Administered 2023-10-06: 1000 mL via INTRAVENOUS

## 2023-10-06 MED ORDER — ACETAMINOPHEN 325 MG PO TABS
650.0000 mg | ORAL_TABLET | Freq: Four times a day (QID) | ORAL | Status: DC | PRN
  Administered 2023-10-11: 650 mg via ORAL
  Filled 2023-10-06: qty 2

## 2023-10-06 MED ORDER — NORTRIPTYLINE HCL 25 MG PO CAPS
100.0000 mg | ORAL_CAPSULE | Freq: Every day | ORAL | Status: DC
  Administered 2023-10-06 – 2023-10-11 (×6): 100 mg via ORAL
  Filled 2023-10-06 (×8): qty 4

## 2023-10-06 MED ORDER — ONDANSETRON HCL 4 MG PO TABS
4.0000 mg | ORAL_TABLET | Freq: Four times a day (QID) | ORAL | Status: DC | PRN
Start: 2023-10-06 — End: 2023-10-12

## 2023-10-06 MED ORDER — INSULIN GLARGINE-YFGN 100 UNIT/ML ~~LOC~~ SOLN
4.0000 [IU] | Freq: Every day | SUBCUTANEOUS | Status: DC
  Administered 2023-10-06 – 2023-10-11 (×6): 4 [IU] via SUBCUTANEOUS
  Filled 2023-10-06 (×6): qty 0.04

## 2023-10-06 NOTE — Assessment & Plan Note (Signed)
 History of chronic inappropriate sinus tachycardia, previously prescribed ivabradine by cardiology.  Heart rate seems to be at her baseline.  - Telemetry monitoring

## 2023-10-06 NOTE — Assessment & Plan Note (Signed)
 Patient is presenting with approximately 1 day history of dizziness with multiple falls and an episode of syncope today at work.  On EDPs evaluation, patient demonstrated ataxia on gait evaluation.  Unclear etiology at this time, however differential includes CVA versus vertigo versus cardiac disease (given concern for right heart strain on CT imaging).  - MRI brain pending - Telemetry monitoring - Obtain orthostatics - PT/OT - Cardiac workup as noted below

## 2023-10-06 NOTE — Assessment & Plan Note (Addendum)
-   Hold home insulin regimen - SSI, moderate - Semglee 4 units at bedtime

## 2023-10-06 NOTE — H&P (Signed)
 History and Physical    Patient: Mary Velasquez JWJ:[id number] DOB: 1981/01/16 DOA: 10/06/2023 DOS: the patient was seen and examined on 10/06/2023 PCP: System, Provider Not In  Patient coming from: Home  Chief Complaint:  Chief Complaint  Patient presents with   Loss of Consciousness   HPI: Mary Velasquez is a 43 y.o. female with medical history significant of type 2 diabetes, recent hysterectomy complicated by post-op anemia requiring blood transfusion, inappropriate sinus tachycardia, brain AVM, peripheral neuropathy, who presents to the ED due to dizziness and syncope.   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  When she came to, she was laying on the ground and the resident was yelling her name.  She denies any prodromal symptoms other than persistent dizziness prior to the syncope episode.  She notes in the past, when she had dizziness or syncope, was due to heavy menstrual cycles but she has not had any menstrual cycle since hysterectomy.  She endorses chronic lower extremity swelling that is unchanged and new abdominal swelling that has been occurring for the last few months.  She denies any orthopnea, shortness of breath, chest pain, palpitations.  She denies any recent illness.  ED Course:  On arrival to the ED, patient was normotensive at 127/91 with heart rate of 108. She was saturating at 93% on room air. She was afebrile at 98. Initial work up notable for hemoglobin of 10.6, sodium 133, creatinine 112, glucose 162 GFR above 60.  Troponin 33 none 35.  D-dimer elevated 0.84.  CTA obtained with no evidence of PE, however Multichamber cardiomegaly with reflux of contrast into hepatic veins suggesting right heart dysfunction with patchy mosaic attenuation and groundglass  opacities concerning for pulmonary edema.  CTA of the head was obtained with no evidence of acute intracranial abnormality.  Patient started on aspirin, IV fluids, meclizine and Zofran.  TRH contacted for admission.  Review of Systems: As mentioned in the history of present illness. All other systems reviewed and are negative.  Past Medical History:  Diagnosis Date   Anemia    AVM (arteriovenous malformation)    Diabetes mellitus without complication (HCC)    History of kidney stones    Peripheral neuropathy    Pneumonia    Tachycardia    Past Surgical History:  Procedure Laterality Date   ABDOMINAL SURGERY     BREAST SURGERY     CESAREAN SECTION     x 2   CYSTOSCOPY N/A 08/21/2023   Procedure: CYSTOSCOPY;  Surgeon: Schermerhorn, Ihor Austin, MD;  Location: [facility] ORS;  Service: Gynecology;  Laterality: N/A;   ESOPHAGOGASTRODUODENOSCOPY N/A 07/10/2018   Procedure: ESOPHAGOGASTRODUODENOSCOPY (EGD);  Surgeon: Toney Reil, MD;  Location: [facility] ENDOSCOPY;  Service: Gastroenterology;  Laterality: N/A;   ESOPHAGOGASTRODUODENOSCOPY     HYSTERECTOMY ABDOMINAL WITH SALPINGECTOMY Bilateral 08/21/2023   Procedure: HYSTERECTOMY ABDOMINAL WITH SALPINGECTOMY;  Surgeon: Schermerhorn, Ihor Austin, MD;  Location: [facility] ORS;  Service: Gynecology;  Laterality: Bilateral;   TUBAL LIGATION     Social History:  reports that she has never smoked. She has never used smokeless tobacco. She reports that she does not currently use drugs. She reports that she does not drink alcohol.  Allergies  Allergen Reactions   Oxycodone Anaphylaxis and Swelling  Throat swelling   Oxycontin [Oxycodone Hcl] Anaphylaxis and Swelling    Throat swelling     Family History  Problem Relation Age of Onset   Heart disease Paternal Aunt    Heart attack Maternal Grandfather    Heart disease Maternal Grandfather    Heart attack Cousin    Heart disease Cousin    CAD Neg Hx    Seizures Neg Hx     Prior to Admission  medications   Medication Sig Start Date End Date Taking? Authorizing Provider  Blood Glucose Monitoring Suppl DEVI 1 each by Does not apply route 3 (three) times daily. May dispense any manufacturer covered by patient's insurance. 01/16/23   Darlin Priestly, MD  ferrous sulfate 325 (65 FE) MG EC tablet Take 1 tablet (325 mg total) by mouth 2 (two) times daily. 08/24/23 08/23/24  Schermerhorn, Ihor Austin, MD  gabapentin (NEURONTIN) 300 MG capsule Take 600 mg by mouth at bedtime. 08/13/21   [provider]  Glucose Blood (BLOOD GLUCOSE TEST STRIPS) STRP 1 each by Does not apply route 3 (three) times daily. Use as directed to check blood sugar. May dispense any manufacturer covered by patient's insurance and fits patient's device. 01/16/23   Darlin Priestly, MD  insulin aspart (NOVOLOG) 100 UNIT/ML FlexPen Inject 4 Units into the skin 3 (three) times daily with meals. Only take if eating a meal AND Blood Glucose (BG) is 80 or higher. Patient taking differently: Inject 8 Units into the skin 3 (three) times daily with meals. Only take if eating a meal AND Blood Glucose (BG) is 80 or higher. 01/16/23   Darlin Priestly, MD  insulin glargine (LANTUS) 100 UNIT/ML Solostar Pen Inject 14 Units into the skin at bedtime. May substitute as needed per insurance. Patient taking differently: Inject 18-50 Units into the skin at bedtime. May substitute as needed per insurance. 01/16/23 08/21/23  Darlin Priestly, MD  Insulin Pen Needle (PEN NEEDLES) 31G X 5 MM MISC 1 each by Does not apply route 3 (three) times daily. May dispense any manufacturer covered by patient's insurance. 01/16/23   Darlin Priestly, MD  iron sucrose (VENOFER) Inject 265 mLs (300 mg total) into the vein once for 1 dose. 08/24/23 08/24/23  Schermerhorn, Ihor Austin, MD  Lancet Device MISC 1 each by Does not apply route 3 (three) times daily. May dispense any manufacturer covered by patient's insurance. 01/16/23   Darlin Priestly, MD  Lancets MISC 1 each by Does not apply route 3 (three) times  daily. Use as directed to check blood sugar. May dispense any manufacturer covered by patient's insurance and fits patient's device. 01/16/23   Darlin Priestly, MD  metFORMIN (GLUCOPHAGE) 1000 MG tablet Take 1,000 mg by mouth 2 (two) times daily with a meal. Breakfast & supper    [provider]  norethindrone-ethinyl estradiol 1/35 (ORTHO-NOVUM) tablet Take 1 pill 3 times per day for 3 days, then 1 pill 2 times per day for 2 days, then 1 pill daily until you see your outpatient gynecologist. 01/16/23   Darlin Priestly, MD  nortriptyline (PAMELOR) 50 MG capsule Take 100 mg by mouth at bedtime. 08/13/21   [provider]    Physical Exam: Vitals:   10/06/23 1200 10/06/23 1300 10/06/23 1330 10/06/23 1336  BP: (!) 162/94 (!) 133/111 (!) 145/102   Pulse: (!) 114 (!) 116 (!) 114   Resp: 16 19 17    Temp:    98.1 F (36.7 C)  TempSrc:    Oral  SpO2:  98% 95%   Weight:      Height:       Physical Exam Vitals and nursing note reviewed.  Constitutional:      General: She is not in acute distress.    Appearance: She is obese. She is not toxic-appearing.  HENT:     Head: Normocephalic and atraumatic.     Mouth/Throat:     Mouth: Mucous membranes are moist.     Pharynx: Oropharynx is clear.  Eyes:     Extraocular Movements: Extraocular movements intact.     Conjunctiva/sclera: Conjunctivae normal.  Cardiovascular:     Rate and Rhythm: Regular rhythm. Tachycardia present.     Heart sounds: No murmur heard.    No gallop.  Pulmonary:     Effort: Pulmonary effort is normal. No respiratory distress.     Breath sounds: Normal breath sounds. No wheezing, rhonchi or rales.  Abdominal:     General: Bowel sounds are normal. There is distension (Mild).     Palpations: Abdomen is soft.     Tenderness: There is no abdominal tenderness. There is no guarding.  Musculoskeletal:     Right lower leg: No edema.     Left lower leg: No edema.  Skin:    General: Skin is warm and dry.  Neurological:      Mental Status: She is alert.     Comments:  No facial asymmetry or dysarthria. Strength equal and intact throughout Sensation grossly intact Normal finger-to-nose bilaterally  Psychiatric:        Mood and Affect: Mood normal.        Behavior: Behavior normal.    Data Reviewed: CBC with WBC 9.0, hemoglobin 10.6, MCV 86, platelets 299 BMP with sodium of 133, potassium 4.4, bicarb 26, glucose 162, BUN 15, creat 112, GFR above 60 Troponin 33 and then 35 D-dimer 0.84 Serum beta-hCG 1  EKG personally reviewed.  Sinus rhythm with a rate of 108.  No ST or T wave changes concerning for acute ischemia.  CT Angio Chest PE W and/or Wo Contrast Result Date: 10/06/2023 CLINICAL DATA:  Syncope and elevated D-dimer EXAM: CT ANGIOGRAPHY CHEST WITH CONTRAST TECHNIQUE: Multidetector CT imaging of the chest was performed using the standard protocol during bolus administration of intravenous contrast. Multiplanar CT image reconstructions and MIPs were obtained to evaluate the vascular anatomy. RADIATION DOSE REDUCTION: This exam was performed according to the departmental dose-optimization program which includes automated exposure control, adjustment of the mA and/or kV according to patient size and/or use of iterative reconstruction technique. CONTRAST:  OMNIPAQUE IOHEXOL 350 MG/ML SOLN COMPARISON:  None Available. FINDINGS: Cardiovascular: The study is high quality for the evaluation of pulmonary embolism. There are no filling defects in the central, lobar, segmental or subsegmental pulmonary artery branches to suggest acute pulmonary embolism. Dilated main pulmonary artery measures 3.2 cm. Multichamber cardiomegaly. No significant pericardial fluid/thickening. Reflux of contrast material into the hepatic veins, suggesting a degree of right heart dysfunction. Mediastinum/Nodes: Imaged thyroid gland without nodules meeting criteria for imaging follow-up by size. Mildly patulous esophagus. 10 mm  pretracheal lymph nodes (7:26, 35). Lungs/Pleura: The central airways are patent. Patchy mosaic attenuation and diffuse peribronchovascular ground-glass opacities. No pneumothorax. Small bilateral pleural. Upper abdomen: Normal. Musculoskeletal: No acute or abnormal lytic or blastic osseous lesions. Review of the MIP images confirms the above findings. IMPRESSION: 1. No evidence of pulmonary embolism. 2. Multichamber cardiomegaly with reflux of contrast material into the hepatic veins, suggesting a degree of right heart  dysfunction. 3. Patchy mosaic attenuation and diffuse peribronchovascular ground-glass opacities, which may be seen in the setting of pulmonary edema. 4. Small bilateral pleural effusions. 5. Dilated main pulmonary artery, which can be seen in the setting of pulmonary hypertension. Electronically Signed   By: Agustin Cree M.D.   On: 10/06/2023 13:04   CT Angio Head W or Wo Contrast Result Date: 10/06/2023 CLINICAL DATA:  43 year old female with dizziness and ataxia. Struck head with loss of consciousness. EXAM: CT ANGIOGRAPHY HEAD WITH AND WITHOUT CONTRAST TECHNIQUE: Multidetector CT imaging of the head was performed using the standard protocol during bolus administration of intravenous contrast. Multiplanar CT image reconstructions and MIPs were obtained to evaluate the vascular anatomy. Carotid stenosis measurements (when applicable) are obtained utilizing NASCET criteria, using the distal internal carotid diameter as the denominator. RADIATION DOSE REDUCTION: This exam was performed according to the departmental dose-optimization program which includes automated exposure control, adjustment of the mA and/or kV according to patient size and/or use of iterative reconstruction technique. CONTRAST:  OMNIPAQUE IOHEXOL 350 MG/ML SOLN COMPARISON:  Head CT 01/15/2023.  Brain MRI 03/29/2022. FINDINGS: CT HEAD Brain: Stable cerebral volume. No midline shift, ventriculomegaly, mass effect, evidence of  mass lesion, intracranial hemorrhage or evidence of cortically based acute infarction. Gray-white matter differentiation is within normal limits throughout the brain. Calvarium and skull base: Intact. No acute osseous abnormality identified. Paranasal sinuses: Progressed right maxillary sinus disease since last year, total visible sinus opacification there now. New right ethmoid sinus mucosal thickening. Other Visualized paranasal sinuses and mastoids are stable and well aerated. Orbits: No acute orbit or scalp soft tissue finding. CTA HEAD Posterior circulation: Distal vertebral arteries appear codominant and patent to the vertebrobasilar junction. PICA origins are patent. No distal vertebral plaque or stenosis identified. Patent although diminutive basilar artery. No basilar stenosis. SCA and right PCA origins are patent. Fetal type left PCA origin. Right posterior communicating artery is also present. Bilateral PCA branches are within normal limits. Anterior circulation: Distal cervical ICAs, both ICA siphons are patent. No siphon plaque or stenosis is identified. Normal posterior communicating artery origins. Patent carotid termini. Patent MCA and ACA origins. Dominant right A1. Normal anterior communicating artery. Diminutive left A1. Bilateral ACA branches are within normal limits. Left MCA M1 segment and bifurcation are patent without stenosis. Right MCA M1 segment and bifurcation are patent without stenosis. Bilateral MCA branches are within normal limits. Venous sinuses: Early contrast timing, not well evaluated. Anatomic variants: Posterior circulation including fetal type left PCA origin, highly dominant right ACA A1. Review of the MIP images confirms the above findings IMPRESSION: 1. Negative CTA Head.  Stable and normal CT appearance of the brain. 2. Progressed right maxillary and ethmoid sinus disease since last year. Electronically Signed   By: Odessa Fleming M.D.   On: 10/06/2023 12:25   Results are  pending, will review when available.  Assessment and Plan:  * Syncope Patient is presenting with approximately 1 day history of dizziness with multiple falls and an episode of syncope today at work.  On EDPs evaluation, patient demonstrated ataxia on gait evaluation.  Unclear etiology at this time, however differential includes CVA versus vertigo versus cardiac disease (given concern for right heart strain on CT imaging).  - MRI brain pending - Telemetry monitoring - Obtain orthostatics - PT/OT - Cardiac workup as noted below  Elevated troponin On presentation, patient's troponins were noted to be mildly elevated indicating demand.  CTA was obtained to rule out PE and  notable for concern of right heart dysfunction, findings suggestive of pulmonary hypertension and pulmonary edema. Unclear etiology, patient had an echocardiogram in May 2024 that demonstrated only grade 1 diastolic dysfunction and no evidence of right heart dysfunction.  Small pericardial effusion was noted at the time.  - Cardiology consult; appreciate their recommendations - Repeat echocardiogram - BNP  Sinus tachycardia History of chronic inappropriate sinus tachycardia, previously prescribed ivabradine by cardiology.  Heart rate seems to be at her baseline.  - Telemetry monitoring  Normocytic anemia History of chronic anemia, initially due to chronic blood loss and then postoperative blood loss.  Hemoglobin appears to be improving at this time compared to prior in February 2025 at which time she required blood transfusion.  DM (diabetes mellitus), type 2 with neurological complications (HCC) - Hold home insulin regimen - SSI, moderate - Semglee 10 units at bedtime  Advance Care Planning:   Code Status: Full Code   Consults: Cardiology  Family Communication: No family at bedside.   Severity of Illness: The appropriate patient status for this patient is OBSERVATION. Observation status is judged to be  reasonable and necessary in order to provide the required intensity of service to ensure the patient's safety. The patient's presenting symptoms, physical exam findings, and initial radiographic and laboratory data in the context of their medical condition is felt to place them at decreased risk for further clinical deterioration. Furthermore, it is anticipated that the patient will be medically stable for discharge from the hospital within 2 midnights of admission.   Author: Verdene Lennert, MD 10/06/2023 3:56 PM  For on call review [email or web address].

## 2023-10-06 NOTE — ED Notes (Signed)
 Pt's sats 87% on RA, placed on [facility]

## 2023-10-06 NOTE — ED Provider Notes (Signed)
 [facility] Provider Note    Event Date/Time   First MD Initiated Contact with Patient 10/06/23 [phone]     (approximate)   History   Loss of Consciousness Patient to ED via [facility] from work for syncope. Pt had hysterectomy in Feb also had blood transfusion. States she did hit her head with LOC. Neg orthostatics with EMS. Hx diabetes. Denies blood thinners.  194 cbg 133/91 94% RA    HPI  Mary Velasquez is a 43 y.o. female PMH DM2, chronic pain syndrome, prior syncope presents for evaluation of loss of consciousness episode -On my evaluation, patient states she has felt dizzy since yesterday evening.  Endorses disequilibrium, intermittent vertigo, and intermittent lightheadedness.  Denies any focal weakness or numbness. -No chest pain or shortness of breath -No history of DVT/PE, no recent stasis or travel, no unilateral leg swelling or pain.  Did have hysterectomy about 2 months ago. -Says this feels dizzy to her last episode of dizziness that required a transfusion, has had 2-2 transfusions in the past, appear to be related to abnormal uterine bleeding prior to her hysterectomy. -No black or bloody stools, no hematemesis, no vaginal bleeding, no hematuria, no hemoptysis -No urinary symptoms -Of note, tells me she has fallen 3-4 times due to her dizziness since last night.  Did have a syncopal episode at work today with a prodrome of lightheadedness. -History of cerebral AVM, had mild headache after fall, no ongoing headache currently -Not on thinners     Physical Exam   Triage Vital Signs: ED Triage Vitals  Encounter Vitals Group     BP 10/06/23 0856 (!) 127/91     Systolic BP Percentile --      Diastolic BP Percentile --      Pulse Rate 10/06/23 0856 (!) 108     Resp 10/06/23 0856 17     Temp 10/06/23 0856 98 F (36.7 C)     Temp Source 10/06/23 0856 Oral     SpO2 10/06/23 0856 93 %     Weight 10/06/23 0854 160 lb (72.6 kg)     Height 10/06/23  0854 5' (1.524 m)     Head Circumference --      Peak Flow --      Pain Score 10/06/23 0854 4     Pain Loc --      Pain Education --      Exclude from Growth Chart --     Most recent vital signs: Vitals:   10/06/23 1330 10/06/23 1336  BP: (!) 145/102   Pulse: (!) 114   Resp: 17   Temp:  98.1 F (36.7 C)  SpO2: 95%      General: Awake, no distress.  HEENT:  Atraumatic CV:  Good peripheral perfusion.  Regular rate and rhythm, RP 2+.  Heart rate 80s-90s at time of my eval. Resp:  Normal effort.  CTAB. Abd:  No distention.  No tenderness to the palpation throughout Neuro:  Aox4, CN II-XII intact, FNF wnl, finger taps fast b/l, 5/5 strength in bilateral finger extension/grip, arm flexion/extension, EHL/FHL. BUE AG 10+ sec no drift, BLE AG 5+ sec no drift. SILT. Negative Rhomberg.  Does have ataxia with attempts at ambulation, unsteady gait, required my personal assistance to avoid falling    ED Results / Procedures / Treatments   Labs (all labs ordered are listed, but only abnormal results are displayed) Labs Reviewed  BASIC METABOLIC PANEL WITH GFR - Abnormal; Notable for the  following components:      Result Value   Sodium 133 (*)    Glucose, Bld 162 (*)    Creatinine, Ser 1.12 (*)    Calcium 8.1 (*)    All other components within normal limits  CBC WITH DIFFERENTIAL/PLATELET - Abnormal; Notable for the following components:   RBC 3.82 (*)    Hemoglobin 10.6 (*)    HCT 33.1 (*)    All other components within normal limits  D-DIMER, QUANTITATIVE - Abnormal; Notable for the following components:   D-Dimer, Quant 0.84 (*)    All other components within normal limits  BRAIN NATRIURETIC PEPTIDE - Abnormal; Notable for the following components:   B Natriuretic Peptide 235.0 (*)    All other components within normal limits  TROPONIN I (HIGH SENSITIVITY) - Abnormal; Notable for the following components:   Troponin I (High Sensitivity) 33 (*)    All other components within  normal limits  TROPONIN I (HIGH SENSITIVITY) - Abnormal; Notable for the following components:   Troponin I (High Sensitivity) 35 (*)    All other components within normal limits  HCG, QUANTITATIVE, PREGNANCY  URINALYSIS, COMPLETE (UACMP) WITH MICROSCOPIC  TYPE AND SCREEN     EKG  ECG = sinus tachycardia, rate 108, no ST elevation depression, no significant repolarization abnormality, normal axis, normal intervals.  No evidence of ischemia on my read.   RADIOLOGY See ED course below    PROCEDURES:  Critical Care performed: No  Procedures   MEDICATIONS ORDERED IN ED: Medications  lactated ringers bolus 1,000 mL (0 mLs Intravenous Stopped 10/06/23 1315)  meclizine (ANTIVERT) tablet 25 mg (25 mg Oral Given 10/06/23 1012)  ondansetron (ZOFRAN) injection 4 mg (4 mg Intravenous Given 10/06/23 1010)  iohexol (OMNIPAQUE) 350 MG/ML injection 100 mL (100 mLs Intravenous Contrast Given 10/06/23 1149)  aspirin chewable tablet 324 mg (324 mg Oral Given 10/06/23 1347)     IMPRESSION / MDM / ASSESSMENT AND PLAN / ED COURSE  I reviewed the triage vital signs and the nursing notes.                              Differential diagnosis includes, but is not limited to, anemia, consider lecture light abnormality, doubt ACS, consider stroke (most likely posterior if so), consider underlying UTI, no evidence of other underlying infection.  Will plan for broad workup, labs, dizziness medications, IV fluid, CTA head and reassessment.  Patient's presentation is most consistent with acute presentation with potential threat to life or bodily function.   The patient is on the cardiac monitor to evaluate for evidence of arrhythmia and/or significant heart rate changes.  ED course below.  Patient mated to hospitalist service due to workup showing evidence of new right-sided heart failure in setting of recent syncopal episodes.  Fortunately no evidence of underlying PE.  Also has persistent dizziness and  ataxia that is of unclear etiology, consider possible posterior stroke.  Given aspirin after negative CT head, MRI ordered in consultation with admitting medicine provider.   Clinical Course as of 10/06/23 1533  Mon Oct 06, 2023  1035 CBC with improved anemia, no leukocytosis [MM]  1119 Dimer newly elevated, does have recent surgical history, tachycardia, unexplained syncope  Will escalate to CT PE [MM]  1136 Troponin mildly elevated, patient with no chest pain, will trend [MM]  1255 CTA head reviewed, unremarkable on my read, formal read below  CTA head: IMPRESSION: 1. Negative  CTA Head.  Stable and normal CT appearance of the brain. 2. Progressed right maxillary and ethmoid sinus disease since last year.   [MM]  1329 CT PE interpreted by myself, no pulmonary embolism identified, formal read below  IMPRESSION: 1. No evidence of pulmonary embolism. 2. Multichamber cardiomegaly with reflux of contrast material into the hepatic veins, suggesting a degree of right heart dysfunction. 3. Patchy mosaic attenuation and diffuse peribronchovascular ground-glass opacities, which may be seen in the setting of pulmonary edema. 4. Small bilateral pleural effusions. 5. Dilated main pulmonary artery, which can be seen in the setting of pulmonary hypertension.   [MM]  1332 Evaluated, findings discussed including possibility of new onset heart failure.  Continues to have ataxia here.  Otherwise well-appearing.  Given possibility of new heart failure and an episode of syncope, I do believe expedited cardiac workup is reasonable.  Also consider possibility of posterior stroke, will discuss further with hospitalist.  Hospitalist consult order placed [MM]  1406 Discussed w/ hospitalist for admission [MM]    Clinical Course User Index [MM] Marinell Blight, MD     FINAL CLINICAL IMPRESSION(S) / ED DIAGNOSES   Final diagnoses:  Syncope and collapse  Ataxia  Abnormal CT of the chest      Rx / DC Orders   ED Discharge Orders     None        Note:  This document was prepared using Dragon voice recognition software and may include unintentional dictation errors.   Marinell Blight, MD 10/06/23 [phone]

## 2023-10-06 NOTE — ED Notes (Signed)
 PT to CT at this time.

## 2023-10-06 NOTE — ED Triage Notes (Signed)
 Patient to ED via [facility] from work for syncope. Pt had hysterectomy in Feb also had blood transfusion. States she did hit her head with LOC. Neg orthostatics with EMS. Hx diabetes. Denies blood thinners.  194 cbg 133/91 94% RA

## 2023-10-06 NOTE — Assessment & Plan Note (Addendum)
 On presentation, patient's troponins were noted to be mildly elevated indicating demand.  CTA was obtained to rule out PE and notable for concern of right heart dysfunction, findings suggestive of pulmonary hypertension and pulmonary edema. Unclear etiology, patient had an echocardiogram in May 2024 that demonstrated only grade 1 diastolic dysfunction and no evidence of right heart dysfunction.  Small pericardial effusion was noted at the time.  - Cardiology consult; appreciate their recommendations - Repeat echocardiogram - BNP

## 2023-10-06 NOTE — Assessment & Plan Note (Signed)
 History of chronic anemia, initially due to chronic blood loss and then postoperative blood loss.  Hemoglobin appears to be improving at this time compared to prior in February 2025 at which time she required blood transfusion.

## 2023-10-07 ENCOUNTER — Observation Stay (HOSPITAL_BASED_OUTPATIENT_CLINIC_OR_DEPARTMENT_OTHER)
Admit: 2023-10-07 | Discharge: 2023-10-07 | Disposition: A | Discharge status: Home / Self Care | Attending: Internal Medicine | Admitting: Internal Medicine

## 2023-10-07 DIAGNOSIS — R0902 Hypoxemia: Secondary | ICD-10-CM | POA: Diagnosis not present

## 2023-10-07 DIAGNOSIS — E1149 Type 2 diabetes mellitus with other diabetic neurological complication: Secondary | ICD-10-CM | POA: Diagnosis present

## 2023-10-07 DIAGNOSIS — I428 Other cardiomyopathies: Secondary | ICD-10-CM | POA: Diagnosis not present

## 2023-10-07 DIAGNOSIS — Z79899 Other long term (current) drug therapy: Secondary | ICD-10-CM | POA: Diagnosis not present

## 2023-10-07 DIAGNOSIS — I4711 Inappropriate sinus tachycardia, so stated: Secondary | ICD-10-CM | POA: Diagnosis not present

## 2023-10-07 DIAGNOSIS — Z885 Allergy status to narcotic agent status: Secondary | ICD-10-CM | POA: Diagnosis not present

## 2023-10-07 DIAGNOSIS — I5021 Acute systolic (congestive) heart failure: Secondary | ICD-10-CM | POA: Diagnosis present

## 2023-10-07 DIAGNOSIS — Z8249 Family history of ischemic heart disease and other diseases of the circulatory system: Secondary | ICD-10-CM | POA: Diagnosis not present

## 2023-10-07 DIAGNOSIS — R55 Syncope and collapse: Principal | ICD-10-CM | POA: Diagnosis present

## 2023-10-07 DIAGNOSIS — Z7984 Long term (current) use of oral hypoglycemic drugs: Secondary | ICD-10-CM | POA: Diagnosis not present

## 2023-10-07 DIAGNOSIS — E1142 Type 2 diabetes mellitus with diabetic polyneuropathy: Secondary | ICD-10-CM | POA: Diagnosis present

## 2023-10-07 DIAGNOSIS — I5A Non-ischemic myocardial injury (non-traumatic): Secondary | ICD-10-CM | POA: Diagnosis present

## 2023-10-07 DIAGNOSIS — E876 Hypokalemia: Secondary | ICD-10-CM | POA: Diagnosis not present

## 2023-10-07 DIAGNOSIS — Z6831 Body mass index (BMI) 31.0-31.9, adult: Secondary | ICD-10-CM | POA: Diagnosis not present

## 2023-10-07 DIAGNOSIS — Z5986 Financial insecurity: Secondary | ICD-10-CM | POA: Diagnosis not present

## 2023-10-07 DIAGNOSIS — I420 Dilated cardiomyopathy: Secondary | ICD-10-CM | POA: Diagnosis present

## 2023-10-07 DIAGNOSIS — E873 Alkalosis: Secondary | ICD-10-CM | POA: Diagnosis not present

## 2023-10-07 DIAGNOSIS — I5041 Acute combined systolic (congestive) and diastolic (congestive) heart failure: Secondary | ICD-10-CM | POA: Diagnosis not present

## 2023-10-07 DIAGNOSIS — E66811 Obesity, class 1: Secondary | ICD-10-CM | POA: Diagnosis present

## 2023-10-07 DIAGNOSIS — I2489 Other forms of acute ischemic heart disease: Secondary | ICD-10-CM | POA: Diagnosis present

## 2023-10-07 DIAGNOSIS — I3139 Other pericardial effusion (noninflammatory): Secondary | ICD-10-CM | POA: Diagnosis present

## 2023-10-07 DIAGNOSIS — Z794 Long term (current) use of insulin: Secondary | ICD-10-CM | POA: Diagnosis not present

## 2023-10-07 DIAGNOSIS — R7989 Other specified abnormal findings of blood chemistry: Secondary | ICD-10-CM | POA: Diagnosis not present

## 2023-10-07 DIAGNOSIS — I272 Pulmonary hypertension, unspecified: Secondary | ICD-10-CM | POA: Diagnosis present

## 2023-10-07 DIAGNOSIS — R296 Repeated falls: Secondary | ICD-10-CM | POA: Diagnosis present

## 2023-10-07 DIAGNOSIS — D649 Anemia, unspecified: Secondary | ICD-10-CM | POA: Diagnosis present

## 2023-10-07 LAB — ECHOCARDIOGRAM COMPLETE
AR max vel: 2.2 cm2
AV Area VTI: 2.28 cm2
AV Area mean vel: 1.83 cm2
AV Mean grad: 3 mmHg
AV Peak grad: 6.1 mmHg
Ao pk vel: 1.24 m/s
Area-P 1/2: 5.62 cm2
Calc EF: 36.3 %
Height: 60 in
MV VTI: 2.76 cm2
S' Lateral: 3.5 cm
Single Plane A2C EF: 36.6 %
Single Plane A4C EF: 35.8 %
Weight: 2560 [oz_av]

## 2023-10-07 LAB — CBC WITH DIFFERENTIAL/PLATELET
Abs Immature Granulocytes: 0.03 10*3/uL (ref 0.00–0.07)
Basophils Absolute: 0 10*3/uL (ref 0.0–0.1)
Basophils Relative: 0 %
Eosinophils Absolute: 0.1 10*3/uL (ref 0.0–0.5)
Eosinophils Relative: 2 %
HCT: 30.2 % — ABNORMAL LOW (ref 36.0–46.0)
Hemoglobin: 9.9 g/dL — ABNORMAL LOW (ref 12.0–15.0)
Immature Granulocytes: 0 %
Lymphocytes Relative: 17 %
Lymphs Abs: 1.2 10*3/uL (ref 0.7–4.0)
MCH: 27.7 pg (ref 26.0–34.0)
MCHC: 32.8 g/dL (ref 30.0–36.0)
MCV: 84.6 fL (ref 80.0–100.0)
Monocytes Absolute: 0.9 10*3/uL (ref 0.1–1.0)
Monocytes Relative: 13 %
Neutro Abs: 4.6 10*3/uL (ref 1.7–7.7)
Neutrophils Relative %: 68 %
Platelets: 296 10*3/uL (ref 150–400)
RBC: 3.57 MIL/uL — ABNORMAL LOW (ref 3.87–5.11)
RDW: 13.7 % (ref 11.5–15.5)
WBC: 6.8 10*3/uL (ref 4.0–10.5)
nRBC: 0 % (ref 0.0–0.2)

## 2023-10-07 LAB — CBG MONITORING, ED
Glucose-Capillary: 96 mg/dL (ref 70–99)
Glucose-Capillary: 184 mg/dL — ABNORMAL HIGH (ref 70–99)
Glucose-Capillary: 177 mg/dL — ABNORMAL HIGH (ref 70–99)
Glucose-Capillary: 209 mg/dL — ABNORMAL HIGH (ref 70–99)

## 2023-10-07 LAB — BASIC METABOLIC PANEL WITH GFR
Anion gap: 7 (ref 5–15)
BUN: 14 mg/dL (ref 6–20)
CO2: 27 mmol/L (ref 22–32)
Calcium: 8.1 mg/dL — ABNORMAL LOW (ref 8.9–10.3)
Chloride: 105 mmol/L (ref 98–111)
Creatinine, Ser: 0.85 mg/dL (ref 0.44–1.00)
GFR, Estimated: >60 mL/min (ref >60)
Glucose, Bld: 60 mg/dL — ABNORMAL LOW (ref 70–99)
Potassium: 3.4 mmol/L — ABNORMAL LOW (ref 3.5–5.1)
Sodium: 139 mmol/L (ref 135–145)

## 2023-10-07 LAB — TSH: TSH: 0.658 u[IU]/mL (ref 0.350–4.500)

## 2023-10-07 MED ORDER — LOSARTAN POTASSIUM 25 MG PO TABS
12.5000 mg | ORAL_TABLET | Freq: Every day | ORAL | Status: DC
  Administered 2023-10-08 – 2023-10-12 (×2): 12.5 mg via ORAL
  Filled 2023-10-07 (×3): qty 1
  Filled 2023-10-07: qty 0.5
  Filled 2023-10-07: qty 1

## 2023-10-07 MED ORDER — FUROSEMIDE 10 MG/ML IJ SOLN
40.0000 mg | Freq: Every day | INTRAMUSCULAR | Status: DC
  Administered 2023-10-07 – 2023-10-09 (×3): 40 mg via INTRAVENOUS
  Filled 2023-10-07 (×3): qty 4

## 2023-10-07 MED ORDER — POTASSIUM CHLORIDE CRYS ER 20 MEQ PO TBCR
20.0000 meq | EXTENDED_RELEASE_TABLET | Freq: Once | ORAL | Status: AC
  Administered 2023-10-07: 20 meq via ORAL
  Filled 2023-10-07: qty 1

## 2023-10-07 NOTE — ED Notes (Signed)
 This RN received report from Augustin Coupe RN and performed care handoff. This RN introduced self to pt. Call light in reach, bed wheels locked, side rail raised, pt updated on plan of care. Rounding completed.

## 2023-10-07 NOTE — ED Notes (Signed)
   10/07/23 0938  Vitals  BP (!) 130/104  MAP (mmHg) 114  ECG Heart Rate (!) 108  Resp 18  Level of Consciousness  Level of Consciousness Alert  MEWS COLOR  MEWS Score Color Green  Oxygen Therapy  SpO2 100 %  O2 Device Nasal Cannula  O2 Flow Rate (L/min) 2 L/min  MEWS Score  MEWS Temp 0  MEWS Systolic 0  MEWS Pulse 1  MEWS RR 0  MEWS LOC 0  MEWS Score 1   Lying orthostatic vital signs

## 2023-10-07 NOTE — Progress Notes (Signed)
 Occupational Therapy Evaluation Patient Details Name: Mary Velasquez MRN: [id number] DOB: [birth year] Today's Date: 10/07/2023   History of Present Illness   Mary Velasquez is a 43 y.o. female with medical history significant of type 2 diabetes, recent hysterectomy complicated by post-op anemia requiring blood transfusion, inappropriate sinus tachycardia, brain AVM, peripheral neuropathy, who presents to the ED due to dizziness and syncope.     Clinical Impressions Pt was seen for OT evaluation this date. Prior to hospital admission, pt was indep in functional mobility, and all IADL/ADLs. Pt lives alone in apartment with no steps to enter. Pt donned bilateral socks independently while seated on EOB. Pt amb to BR located in hallway while pushing a w/c for oxygen attatchment, pt completed toileting with MODI, handwashing at sink with supervision/MODI as a precaution. Pt amb in hallway ~17ft on 2L oxygen Spo2 levels upper 90s throughout, pt amb with no LOB, and no reported dizziness. Standing post amb on RA pt destat to Spo2 85%. Placed back on 2L oxygen via [place] to recover in bed. All questions answered, pt ensures she feels confident in her ability to complete ADL/ADLs once d/c, she reports her family is close by and willing to assist her as needed. Do not anticipate the need for follow up OT services upon acute hospital DC. No OT needs, will sign off.      If plan is discharge home, recommend the following:   Assistance with cooking/housework     Functional Status Assessment   Patient has not had a recent decline in their functional status     Equipment Recommendations   None recommended by OT     Recommendations for Other Services         Precautions/Restrictions   Precautions Precautions: Fall Recall of Precautions/Restrictions: Intact Restrictions Weight Bearing Restrictions Per Provider Order: No     Mobility Bed Mobility Overal bed mobility: Independent              General bed mobility comments: No physical assistance required during bed mobility.    Transfers Overall transfer level: Modified independent Equipment used:  (Pushing w/c for oxygen attatchment only)               General transfer comment: Pt amb in hallway ~146ft on 2L oxygen Spo2 levels upper 90s throughout, pt amb with no LOB, and no reported dizziness.      Balance Overall balance assessment: Needs assistance Sitting-balance support: Feet supported, No upper extremity supported Sitting balance-Leahy Scale: Normal     Standing balance support: No upper extremity supported, During functional activity Standing balance-Leahy Scale: Fair                             ADL either performed or assessed with clinical judgement   ADL Overall ADL's : Needs assistance/impaired Eating/Feeding: Independent                   Lower Body Dressing: Sit to/from stand;Independent (Donning bilateral socks while seated on EOB, utilizing figure 4 method)   Toilet Transfer: Modified Independent;Ambulation;Regular Toilet (Pushing w/c for oxygen attatchment only)   Toileting- Clothing Manipulation and Hygiene: Modified independent       Functional mobility during ADLs: Modified independent (Pushing w/c for oxygen attatchment only) General ADL Comments: Pt amb to BR located in hallway while pushing a w/c for oxygen attatchment, pt completed toileting with MODI, handwashing at sink with supervision/MODI as a precaution.  Vision Baseline Vision/History: 1 Wears glasses       Perception         Praxis         Pertinent Vitals/Pain Pain Assessment Pain Assessment: No/denies pain     Extremity/Trunk Assessment Upper Extremity Assessment Upper Extremity Assessment: Overall WFL for tasks assessed   Lower Extremity Assessment Lower Extremity Assessment: Overall WFL for tasks assessed   Cervical / Trunk Assessment Cervical / Trunk Assessment: Normal    Communication Communication Communication: No apparent difficulties   Cognition Arousal: Alert Behavior During Therapy: WFL for tasks assessed/performed               OT - Cognition Comments: A/Ox4 cooperative and eager to work with therapy                 Following commands: Intact       Cueing  General Comments   Cueing Techniques: Verbal cues  Pt on 2L oxygen via [place] Spo2 upper 90s throughout amb, with no signs of SOB. Standing post amb on RA pt destat to Spo2 85%. Placed back on 2L oxygen via [place] to recover in bed.   Exercises Exercises: Other exercises Other Exercises Other Exercises: Edu: role of OT acute rehab, safety ADL completion   Shoulder Instructions      Home Living Family/patient expects to be discharged to:: Private residence Living Arrangements: Alone Available Help at Discharge: Family;Available PRN/intermittently Type of Home: Apartment Home Access: Level entry     Home Layout: One level     Bathroom Shower/Tub: Chief Strategy Officer: Standard Bathroom Accessibility: Yes How Accessible: Accessible via wheelchair;Accessible via walker Home Equipment: None          Prior Functioning/Environment Prior Level of Function : Working/employed;Driving;Independent/Modified Independent;History of Falls (last six months)             Mobility Comments: No DME ADLs Comments: Indep    OT Problem List:     OT Treatment/Interventions:        OT Goals(Current goals can be found in the care plan section)   Acute Rehab OT Goals Patient Stated Goal: To return home OT Goal Formulation: With patient Time For Goal Achievement: 10/21/23 Potential to Achieve Goals: Good   OT Frequency:       Co-evaluation              AM-PAC OT "6 Clicks" Daily Activity     Outcome Measure Help from another person eating meals?: None Help from another person taking care of personal grooming?: None Help from another person  toileting, which includes using toliet, bedpan, or urinal?: None Help from another person bathing (including washing, rinsing, drying)?: None Help from another person to put on and taking off regular upper body clothing?: None Help from another person to put on and taking off regular lower body clothing?: None 6 Click Score: 24   End of Session Equipment Utilized During Treatment: Gait belt;Oxygen Nurse Communication: Mobility status  Activity Tolerance: Patient tolerated treatment well Patient left: in bed;with call bell/phone within reach                   Time: 0903-0927 OT Time Calculation (min): 24 min Charges:  OT General Charges $OT Visit: 1 Visit OT Evaluation $OT Eval Low Complexity: 1 Low OT Treatments $Self Care/Home Management : 8-22 mins  Glenard Haring M.S. OTR/L  10/07/23, 9:58 AM

## 2023-10-07 NOTE — Progress Notes (Signed)
 PT Cancellation Note  Patient Details Name: Mary Velasquez MRN: [id number] DOB: Feb 21, 1981   Cancelled Treatment:    Reason Eval/Treat Not Completed: Other (comment). Per OT and pt report, pt is ambulatory very near/at her mobility baseline. Denied concerns about her balance, any DME needs or follow up therapy needs, PT to sign off.    Olga Coaster PT, DPT 11:02 AM,10/07/23

## 2023-10-07 NOTE — ED Notes (Signed)
 ECHO study being completed on pt at this time.

## 2023-10-07 NOTE — Consult Note (Signed)
 Cardiology Consultation   Patient ID: RIYA HUXFORD MRN: [id number]; DOB: 23-Nov-1980  Admit date: 10/06/2023 Date of Consult: 10/07/2023  PCP:  System, Provider Not In   [facility] Providers Cardiologist:  None        Patient Profile:   Mary Velasquez is a 43 y.o. female with a hx of T2DM with neuropathy, hysterectomy complicated by post-op anemia requiring transfusion 08/2023, inappropriate sinus tachycardia, brain AVM who is being seen 10/07/2023 for the evaluation of syncope at the request of Dr. Huel Cote.  History of Present Illness:   Ms. White saw Dr. Azucena Cecil 09/2022 for evaluation of elevated blood pressure and tachycardia. She had ongoing elevated HR for one year prior and positive orthostatics with BP dropping to 80s systolic on standing. 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 Echo 10/2022 showed EF 60-65% without RWMA, mild LVH, G1DD, normal RV systolic function and sive, small pericardial effusion present, with RA pressure 3 mmHg.   On my exam, patient is somnolent and frequently falling asleep during conversation. Patient tells me that she has had ongoing dizziness and lightheadedness for the past several days. She cannot tell me if this is positional. She tells me that on Friday she had an episode of lightheadedness/dizziness with syncope. Patient tells me she was home alone and this was unwitnessed. She does not think that she lost consciousness for more than 1 minute. Yesterday 4/7, she was walking at work when she started feeling lightheaded and subsequently "passed out."  She did lose consciousness and did hit her head. She denies chest pain, shortness of breath, palpitations, nausea, fever, bleeding, lower extremity edema, orthopnea, and PND. She tells me she has had a similar episode to this in the past prior to her hysterectomy when she was having menorrhagia and required blood transfusion. She was brought to the hospital via EMS.  Negative orthostatics with EMS. On arrival to the ED, BP 127/91, HR 108 with otherwise normal vital signs. BMP with glucose 162, creatinine up from baseline at 1.12. CBC with hemoglobin 12.6, hematocrit 33.1.  BNP 235. Troponin 33> 35. D-dimer slightly elevated at 0.84 with a CT angiogram of chest negative for PE. CT chest did show multichamber cardiomegaly with reflux of contrast material into the hepatic veins, suggesting a degree of right heart dysfunction and findings suggestive of pulmonary edema. Head CT negative. MRI of the brain negative for acute intracranial abnormality. Patient was started on aspirin, IV fluids, clonidine, Zofran. Echocardiogram is pending. Cardiology was asked to consult for possible CHF based on CT chest findings.  Past Medical History:  Diagnosis Date   Anemia    AVM (arteriovenous malformation)    Diabetes mellitus without complication (HCC)    History of kidney stones    Peripheral neuropathy    Pneumonia    Tachycardia     Past Surgical History:  Procedure Laterality Date   ABDOMINAL SURGERY     BREAST SURGERY     CESAREAN SECTION     x 2   CYSTOSCOPY N/A 08/21/2023   Procedure: CYSTOSCOPY;  Surgeon: Schermerhorn, Ihor Austin, MD;  Location: [facility] ORS;  Service: Gynecology;  Laterality: N/A;   ESOPHAGOGASTRODUODENOSCOPY N/A 07/10/2018   Procedure: ESOPHAGOGASTRODUODENOSCOPY (EGD);  Surgeon: Toney Reil, MD;  Location: [facility] ENDOSCOPY;  Service: Gastroenterology;  Laterality: N/A;   ESOPHAGOGASTRODUODENOSCOPY     HYSTERECTOMY ABDOMINAL WITH SALPINGECTOMY Bilateral 08/21/2023   Procedure: HYSTERECTOMY ABDOMINAL WITH SALPINGECTOMY;  Surgeon: Feliberto Gottron Ihor Austin, MD;  Location: [facility] ORS;  Service: Gynecology;  Laterality: Bilateral;   TUBAL LIGATION         Inpatient Medications: Scheduled Meds:  enoxaparin (LOVENOX) injection  40 mg Subcutaneous Q24H   ferrous sulfate  325 mg Oral Q breakfast   gabapentin  600 mg Oral QHS   insulin aspart  0-15  Units Subcutaneous TID WC   insulin glargine-yfgn  4 Units Subcutaneous QHS   nortriptyline  100 mg Oral QHS   sodium chloride flush  3 mL Intravenous Q12H   Continuous Infusions:  PRN Meds: acetaminophen **OR** acetaminophen, ondansetron **OR** ondansetron (ZOFRAN) IV, polyethylene glycol  Allergies:    Allergies  Allergen Reactions   Oxycodone Anaphylaxis and Swelling    Throat swelling   Oxycontin [Oxycodone Hcl] Anaphylaxis and Swelling    Throat swelling     Social History:   Social History   Socioeconomic History   Marital status: Single    Spouse name: Not on file   Number of children: Not on file   Years of education: Not on file   Highest education level: Not on file  Occupational History   Not on file  Tobacco Use   Smoking status: Never   Smokeless tobacco: Never  Vaping Use   Vaping status: Never Used  Substance and Sexual Activity   Alcohol use: No   Drug use: Not Currently   Sexual activity: Not on file  Other Topics Concern   Not on file  Social History Narrative   Lives alone   Social Drivers of Health   Financial Resource Strain: Low Risk  (04/17/2023)   Received from [facility]   Overall Financial Resource Strain (CARDIA)    Difficulty of Paying Living Expenses: Not hard at all  Food Insecurity: No Food Insecurity (08/21/2023)   Hunger Vital Sign    Worried About Running Out of Food in the Last Year: Never true    Ran Out of Food in the Last Year: Never true  Transportation Needs: No Transportation Needs (08/21/2023)   PRAPARE - Administrator, Civil Service (Medical): No    Lack of Transportation (Non-Medical): No  Physical Activity: Not on file  Stress: Not on file  Social Connections: Not on file  Intimate Partner Violence: Not At Risk (08/21/2023)   Humiliation, Afraid, Rape, and Kick questionnaire    Fear of Current or Ex-Partner: No    Emotionally Abused: No    Physically Abused: No    Sexually  Abused: No    Family History:    Family History  Problem Relation Age of Onset   Heart disease Paternal Aunt    Heart attack Maternal Grandfather    Heart disease Maternal Grandfather    Heart attack Cousin    Heart disease Cousin    CAD Neg Hx    Seizures Neg Hx      ROS:  Please see the history of present illness.   Physical Exam/Data:   Vitals:   10/07/23 0700 10/07/23 0710 10/07/23 0715 10/07/23 0720  BP:   122/88   Pulse: 98 95 94 94  Resp: 12 13 12 12   Temp:      TempSrc:      SpO2: 99% 100% 100% 100%  Weight:      Height:       No intake or output data in the 24 hours ending 10/07/23 0729    10/06/2023    8:54 AM 08/21/2023    7:30 PM 08/21/2023   12:45  PM  Last 3 Weights  Weight (lbs) 160 lb 179 lb 14.3 oz 179 lb 14.3 oz  Weight (kg) 72.576 kg 81.6 kg 81.6 kg     Body mass index is 31.25 kg/m.  General:  Well nourished, well developed, in no acute distress HEENT: normal Neck: no JVD Vascular: No carotid bruits; Distal pulses 2+ bilaterally Cardiac:  normal S1, S2; RRR; no murmur  Lungs: Lung sounds diminished at the bases bilaterally Abd: soft, nontender, no hepatomegaly  Ext: no edema Skin: warm and dry  Psych:  Normal affect   EKG:  The EKG was personally reviewed and demonstrates:  Sinus tachycardia, rate 108 bpm Telemetry:  Telemetry was personally reviewed and demonstrates:  Sinus tachycardia rate 95-115 bpm  Relevant CV Studies:  11/21/2022 Echo complete 1. Left ventricular ejection fraction, by estimation, is 60 to 65%. The  left ventricle has normal function. The left ventricle has no regional  wall motion abnormalities. There is mild left ventricular hypertrophy.  Left ventricular diastolic parameters  are consistent with Grade I diastolic dysfunction (impaired relaxation).   2. Right ventricular systolic function is normal. The right ventricular  size is normal.   3. A small pericardial effusion is present. There is no evidence of   cardiac tamponade.   4. The mitral valve is normal in structure. No evidence of mitral valve  regurgitation. No evidence of mitral stenosis.   5. The aortic valve is tricuspid. Aortic valve regurgitation is not  visualized. No aortic stenosis is present.   6. The inferior vena cava is normal in size with greater than 50%  respiratory variability, suggesting right atrial pressure of 3 mmHg.   Laboratory Data:  High Sensitivity Troponin:   Recent Labs  Lab 10/06/23 1013 10/06/23 1223  TROPONINIHS 33* 35*     Chemistry Recent Labs  Lab 10/06/23 1013 10/07/23 0547  NA 133* 139  K 4.4 3.4*  CL 99 105  CO2 26 27  GLUCOSE 162* 60*  BUN 15 14  CREATININE 1.12* 0.85  CALCIUM 8.1* 8.1*  GFRNONAA >60 >60  ANIONGAP 8 7    No results for input(s): "PROT", "ALBUMIN", "AST", "ALT", "ALKPHOS", "BILITOT" in the last 168 hours. Lipids No results for input(s): "CHOL", "TRIG", "HDL", "LABVLDL", "LDLCALC", "CHOLHDL" in the last 168 hours.  Hematology Recent Labs  Lab 10/06/23 1013 10/07/23 0547  WBC 9.0 6.8  RBC 3.82* 3.57*  HGB 10.6* 9.9*  HCT 33.1* 30.2*  MCV 86.6 84.6  MCH 27.7 27.7  MCHC 32.0 32.8  RDW 13.5 13.7  PLT 299 296   Thyroid No results for input(s): "TSH", "FREET4" in the last 168 hours.  BNP Recent Labs  Lab 10/06/23 1013  BNP 235.0*    DDimer  Recent Labs  Lab 10/06/23 1013  DDIMER 0.84*     Radiology/Studies:  MR BRAIN WO CONTRAST Result Date: 10/06/2023 IMPRESSION: 1. No acute intracranial abnormality. 2. Small chronic cerebellar infarcts. 3. Partially empty sella, often reflecting normal anatomic variation though can also be seen with idiopathic intracranial hypertension. Electronically Signed   By: Sebastian Ache M.D.   On: 10/06/2023 17:02   CT Angio Chest PE W and/or Wo Contrast Result Date: 10/06/2023 IMPRESSION: 1. No evidence of pulmonary embolism. 2. Multichamber cardiomegaly with reflux of contrast material into the hepatic veins, suggesting a  degree of right heart dysfunction. 3. Patchy mosaic attenuation and diffuse peribronchovascular ground-glass opacities, which may be seen in the setting of pulmonary edema. 4. Small bilateral pleural effusions. 5.  Dilated main pulmonary artery, which can be seen in the setting of pulmonary hypertension. Electronically Signed   By: Agustin Cree M.D.   On: 10/06/2023 13:04   CT Angio Head W or Wo Contrast Result Date: 10/06/2023  IMPRESSION: 1. Negative CTA Head.  Stable and normal CT appearance of the brain. 2. Progressed right maxillary and ethmoid sinus disease since last year. Electronically Signed   By: Odessa Fleming M.D.   On: 10/06/2023 12:25   Assessment and Plan:   Syncope  - Patient presented with several days of dizziness/lightheadedness with syncope - Prior echo 10/2022 with EF 60 to 65%, G1 DD, and small pericardial effusion - Telemetry without sign of arrhythmia, continue monitoring - Orthostatics negative with EMS and upon repeat today - CT angio showed multichamber cardiomegaly with reflux of contrast material into the hepatic veins, suggesting right heart dysfunction and signs of pulmonary edema - Appears euvolemic on exam - Echo ordered, further recommendations pending results  Elevated troponin - Troponin mildly elevated at 33>35 - Denies chest pain - EKG without acute ischemic changes - Likely demand ischemia in the setting of syncope and tachycardia - Echo ordered as above - No further ischemic evaluation indicated at this time  Sinus tachycardia - Previous diagnosis of inappropriate sinus tachycardia - TSH within normal limits - Check UDS - Consider outpatient evaluation for OSA  For questions or updates, please contact [facility] Please consult [email or web address] for contact info under    Signed, Orion Crook, PA-C  10/07/2023 7:29 AM

## 2023-10-07 NOTE — Progress Notes (Signed)
*  PRELIMINARY RESULTS* Echocardiogram 2D Echocardiogram has been performed.  Mary Velasquez 10/07/2023, 12:43 PM

## 2023-10-07 NOTE — ED Notes (Signed)
OT with pt at this time.

## 2023-10-07 NOTE — ED Notes (Signed)
   10/07/23 0946  Vitals  BP (!) 129/96  MAP (mmHg) 106  Pulse Rate (!) 109  ECG Heart Rate (!) 111  Resp (!) 22  MEWS COLOR  MEWS Score Color Yellow  Oxygen Therapy  SpO2 98 %  MEWS Score  MEWS Temp 0  MEWS Systolic 0  MEWS Pulse 2  MEWS RR 1  MEWS LOC 0  MEWS Score 3   Standing orthostatic vital signs

## 2023-10-07 NOTE — ED Notes (Signed)
 TRANSPORT paged @ this time to assist in transferring pt to assigned in-patient room

## 2023-10-07 NOTE — ED Notes (Signed)
   10/07/23 0944  Vitals  BP (!) 120/97  MAP (mmHg) 106  Pulse Rate (!) 111  ECG Heart Rate (!) 111  Resp 18  MEWS COLOR  MEWS Score Color Yellow  Oxygen Therapy  SpO2 98 %  MEWS Score  MEWS Temp 0  MEWS Systolic 0  MEWS Pulse 2  MEWS RR 0  MEWS LOC 0  MEWS Score 2   Sitting orthostatic vital signs

## 2023-10-07 NOTE — ED Notes (Signed)
 CBG noted to be 60 in AM lab work, pt given juice and peanut butter crackers

## 2023-10-07 NOTE — ED Notes (Signed)
 This RN gave report to Andre Lefort RN and performed care handoff. Call light in reach, bed wheels locked, side rail raised, pt placed on fall precautions. Pt updated on plan of care. Rounding completed.

## 2023-10-07 NOTE — Progress Notes (Addendum)
 Progress Note   Patient: Mary Velasquez:[id number] DOB: March 18, 1981 DOA: 10/06/2023     0 DOS: the patient was seen and examined on 10/07/2023   Brief hospital course: Mary Velasquez is a 43 y.o. female with medical history significant of type 2 diabetes, recent hysterectomy complicated by post-op anemia requiring blood transfusion, inappropriate sinus tachycardia, brain AVM, peripheral neuropathy, who presents to the ED due to dizziness and syncope.    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  When she came to, she was laying on the ground and the resident was yelling her name.  She denies any prodromal symptoms other than persistent dizziness prior to the syncope episode.  She notes in the past, when she had dizziness or syncope, was due to heavy menstrual cycles but she has not had any menstrual cycle since hysterectomy.  She endorses chronic lower extremity swelling that is unchanged and new abdominal swelling that has been occurring for the last few months.  She denies any orthopnea, shortness of breath, chest pain, palpitations.  She denies any recent illness.   ED Course:  On arrival to the ED, patient was normotensive at 127/91 with heart rate of 108. She was saturating at 93% on room air. She was afebrile at 98. Initial work up notable for hemoglobin of 10.6, sodium 133, creatinine 112, glucose 162 GFR above 60.  Troponin 33 none 35.  D-dimer elevated 0.84.  CTA obtained with no evidence of PE, however Multichamber cardiomegaly with reflux of contrast into hepatic veins suggesting right heart dysfunction with patchy mosaic attenuation and groundglass opacities concerning for pulmonary edema.  CTA of the head was obtained with no evidence of acute intracranial  abnormality.  Patient started on aspirin, IV fluids, meclizine and Zofran.  TRH contacted for admission.      Assessment and Plan:  * Syncope Patient presented for evaluation of a  1 day history of dizziness with multiple falls and an episode of syncope today at work.   Awaiting results of orthostatic blood pressure check 2D echocardiogram shows an LVEF of 30 to 35% with moderately decreased LV systolic function and global hypokinesis.  Right ventricular systolic function is mildly reduced with moderately elevated pulmonary artery systolic pressure Appreciate cardiology input    Elevated troponin On presentation, patient's troponins were noted to be mildly elevated indicating demand ischemia.   CTA was obtained to rule out PE and notable for concern of right heart dysfunction, findings suggestive of pulmonary hypertension and pulmonary edema.  Unclear etiology, patient had an echocardiogram in May 2024 that demonstrated only grade 1 diastolic dysfunction and no evidence of right heart dysfunction.  Small pericardial effusion was noted at the time. Appreciate cardiology input, 2D echocardiogram shows a reduced LVEF of 30 to 35% with LV global hypokinesis. Patient may benefit from a cardiac catheterization and possible cardiac MRI.     Acute systolic CHF Patient presented to the ER for evaluation after a witnessed syncopal episode at work CT angiogram showed multi chamber cardiomegaly with reflux of contrast material into the hepatic veins, suggesting a degree of right heart dysfunction. Patchy mosaic attenuation and diffuse peri bronchovascular ground-glass opacities, which may be seen in the setting of pulmonary edema. Small bilateral pleural  effusions. Dilated main pulmonary artery, which can be seen in the setting of pulmonary hypertension. 2D echocardiogram showed an LVEF of 30 to 35% with moderately decreased LV function and LV global hypokinesis. Will start patient on Lasix 40 mg IV  daily       Hypoxia Patient was noted to have room air pulse oximetry post ambulation of 85% and is currently on 2 L of oxygen to maintain pulse oximetry greater than 92%. Hypoxia appears to be secondary to acute systolic CHF Will attempt to wean patient off oxygen once acute illness improves or resolves.       History of inappropriate sinus tachycardia Will start patient on beta-blockers once acute CHF exacerbation improves    Normocytic anemia History of chronic anemia, initially due to chronic blood loss and then postoperative blood loss.   Hemoglobin appears stable at this time     DM (diabetes mellitus), type 2 with neurological complications (HCC) Continue Semglee 4 units at bedtime with sliding scale coverage Maintain consistent carbohydrate diet      Subjective: Patient is seen and examined at the bedside.  Dizziness has improved  Physical Exam: Vitals:   10/07/23 0946 10/07/23 1000 10/07/23 1120 10/07/23 1140  BP: (!) 129/96 (!) 122/90    Pulse: (!) 109 (!) 108 (!) 114 (!) 115  Resp: (!) 22 (!) 21 (!) 21 (!) 21  Temp:      TempSrc:      SpO2: 98% 100% 98% 96%  Weight:      Height:       Constitutional:      General: She is not in acute distress.    Appearance: She is obese. She is not toxic-appearing.  HENT:     Head: Normocephalic and atraumatic.     Mouth/Throat:     Mouth: Mucous membranes are moist.     Pharynx: Oropharynx is clear.  Eyes:     Extraocular Movements: Extraocular movements intact.     Conjunctiva/sclera: Conjunctivae normal.  Cardiovascular:     Rate and Rhythm: Regular rhythm. Tachycardia present.     Heart sounds: No murmur heard.    No gallop.  Pulmonary:     Effort: Pulmonary effort is normal. No respiratory distress.     Breath sounds: Normal breath sounds. No wheezing, rhonchi or rales.  Abdominal:     General: Bowel sounds are normal.  Central adiposity    Palpations: Abdomen is soft.     Tenderness: There is no  abdominal tenderness. There is no guarding.  Musculoskeletal:     Right lower leg: No edema.     Left lower leg: No edema.  Skin:    General: Skin is warm and dry.  Neurological:     Mental Status: She is alert.     Comments:  No facial asymmetry or dysarthria. Strength equal and intact throughout Sensation grossly intact Normal finger-to-nose bilaterally  Psychiatric:        Mood and Affect: Mood normal.        Behavior: Behavior normal.      Data Reviewed: Labs reviewed.  Potassium 3.4, hemoglobin 9.9, BNP 235 There are no new results to review at this time.  Family Communication: Plan of care was discussed with patient in detail.  She verbalizes understanding and agrees with the plan.  Disposition: Status is: Observation The patient remains OBS appropriate and will d/c before 2 midnights.  Planned Discharge Destination: Home    Time spent: 40 minutes  Author: Elwyn Lade Lilliah Priego,  MD 10/07/2023 1:08 PM  For on call review [email or web address].

## 2023-10-07 NOTE — ED Notes (Signed)
   10/07/23 1450  Vitals  Pulse Rate (!) 119  ECG Heart Rate (!) 119  Resp (!) 27  MEWS COLOR  MEWS Score Color Red  Oxygen Therapy  SpO2 (!) 89 %  MEWS Score  MEWS Temp 0  MEWS Systolic 0  MEWS Pulse 2  MEWS RR 2  MEWS LOC 0  MEWS Score 4   RN in room with pt assisting with gown change and hygiene. Pt SOB with exertion. RN educated pt to inhale deep slow breaths through nasal cannula. Pt demonstrated return feedback and is no longer c/o SOB. Oxygen saturation trending upwards with no complaints from pt.

## 2023-10-07 NOTE — ED Notes (Addendum)
 Pt transported to inpatient room 250 at this time with pt transport. Pt left with all belongings at the bedside. Pt ABCs intact. RR even and unlabored. Pt in NAD. Telemetry called and made aware of patients new room number.

## 2023-10-08 ENCOUNTER — Other Ambulatory Visit (HOSPITAL_COMMUNITY): Payer: Self-pay

## 2023-10-08 ENCOUNTER — Telehealth (HOSPITAL_COMMUNITY): Payer: Self-pay | Admitting: Pharmacy Technician

## 2023-10-08 DIAGNOSIS — I420 Dilated cardiomyopathy: Secondary | ICD-10-CM | POA: Diagnosis not present

## 2023-10-08 DIAGNOSIS — R7989 Other specified abnormal findings of blood chemistry: Secondary | ICD-10-CM | POA: Diagnosis not present

## 2023-10-08 DIAGNOSIS — I4711 Inappropriate sinus tachycardia, so stated: Secondary | ICD-10-CM | POA: Diagnosis not present

## 2023-10-08 DIAGNOSIS — R55 Syncope and collapse: Secondary | ICD-10-CM | POA: Diagnosis not present

## 2023-10-08 DIAGNOSIS — I428 Other cardiomyopathies: Secondary | ICD-10-CM

## 2023-10-08 LAB — BASIC METABOLIC PANEL WITH GFR
Anion gap: 5 (ref 5–15)
BUN: 12 mg/dL (ref 6–20)
CO2: 30 mmol/L (ref 22–32)
Calcium: 7.9 mg/dL — ABNORMAL LOW (ref 8.9–10.3)
Chloride: 102 mmol/L (ref 98–111)
Creatinine, Ser: 0.72 mg/dL (ref 0.44–1.00)
GFR, Estimated: >60 mL/min (ref >60)
Glucose, Bld: 116 mg/dL — ABNORMAL HIGH (ref 70–99)
Potassium: 3.6 mmol/L (ref 3.5–5.1)
Sodium: 137 mmol/L (ref 135–145)

## 2023-10-08 LAB — URINALYSIS, COMPLETE (UACMP) WITH MICROSCOPIC
Bacteria, UA: NONE SEEN
Bilirubin Urine: NEGATIVE
Glucose, UA: NEGATIVE mg/dL
Ketones, ur: NEGATIVE mg/dL
Leukocytes,Ua: NEGATIVE
Nitrite: NEGATIVE
Protein, ur: NEGATIVE mg/dL
Specific Gravity, Urine: 1.005 (ref 1.005–1.030)
WBC, UA: 0 WBC/hpf (ref 0–5)
pH: 6 (ref 5.0–8.0)

## 2023-10-08 LAB — URINE DRUG SCREEN, QUALITATIVE (ARMC ONLY)
Amphetamines, Ur Screen: NOT DETECTED
Barbiturates, Ur Screen: NOT DETECTED
Benzodiazepine, Ur Scrn: NOT DETECTED
Cannabinoid 50 Ng, Ur ~~LOC~~: NOT DETECTED
Cocaine Metabolite,Ur ~~LOC~~: NOT DETECTED
MDMA (Ecstasy)Ur Screen: NOT DETECTED
Methadone Scn, Ur: NOT DETECTED
Opiate, Ur Screen: NOT DETECTED
Phencyclidine (PCP) Ur S: NOT DETECTED
Tricyclic, Ur Screen: POSITIVE — AB

## 2023-10-08 LAB — GLUCOSE, CAPILLARY
Glucose-Capillary: 84 mg/dL (ref 70–99)
Glucose-Capillary: 169 mg/dL — ABNORMAL HIGH (ref 70–99)
Glucose-Capillary: 226 mg/dL — ABNORMAL HIGH (ref 70–99)
Glucose-Capillary: 207 mg/dL — ABNORMAL HIGH (ref 70–99)

## 2023-10-08 MED ORDER — METOPROLOL SUCCINATE ER 25 MG PO TB24
25.0000 mg | ORAL_TABLET | Freq: Every day | ORAL | Status: DC
  Administered 2023-10-08 – 2023-10-12 (×3): 25 mg via ORAL
  Filled 2023-10-08 (×4): qty 1

## 2023-10-08 NOTE — Progress Notes (Signed)
   10/08/23 1415  Assess: MEWS Score  BP 114/84  MAP (mmHg) 95  Pulse Rate (!) 111  SpO2 100 %  O2 Device Nasal Cannula  O2 Flow Rate (L/min) 2 L/min  Assess: MEWS Score  MEWS Temp 0  MEWS Systolic 0  MEWS Pulse 2  MEWS RR 0  MEWS LOC 0  MEWS Score 2  MEWS Score Color Yellow  Assess: if the MEWS score is Yellow or Red  Were vital signs accurate and taken at a resting state? Yes  Does the patient meet 2 or more of the SIRS criteria? No  MEWS guidelines implemented  No, previously yellow, continue vital signs every 4 hours  Assess: SIRS CRITERIA  SIRS Temperature  0  SIRS Respirations  0  SIRS Pulse 1  SIRS WBC 0  SIRS Score Sum  1

## 2023-10-08 NOTE — Telephone Encounter (Signed)
 Patient Product/process development scientist completed.    The patient is insured through [facility]. Patient has [employer], may use a copay card, and/or apply for patient assistance if available.    Ran test claim for Entresto 24-26 mg and the current 30 day co-pay is $30.00.  Ran test claim for Farxiga 10 mg and the current 30 day co-pay is $30.00.  Ran test claim for Jardiance 10 mg and the current 30 day co-pay is $30.00.  This test claim was processed through [facility]- copay amounts may vary at other pharmacies due to pharmacy/plan contracts, or as the patient moves through the different stages of their insurance plan.     Roland Earl, CPHT Pharmacy Technician III Certified Patient Advocate [facility] Patient Advocate Team Direct Number: [phone]  Fax: [phone]

## 2023-10-08 NOTE — Plan of Care (Signed)

## 2023-10-08 NOTE — Progress Notes (Addendum)
 Heart Failure Stewardship Pharmacy Note  PCP: System, Provider Not In PCP-Cardiologist: None  HPI: Mary Velasquez is a 43 y.o. female with  type 2 diabetes, recent hysterectomy complicated by post-op anemia requiring blood transfusion, inappropriate sinus tachycardia, brain AVM, peripheral neuropathy who presented with dizziness and syncope. On admission, BNP was 235, HS-troponin was 35, UDS positive only for PTA antidepressant, d-dimer 0.84, and TSH 0.658. CT head was negative. CTA chest was negative for PE, but showed cardiomegaly, pulmonary edema, small bilateral pleural effusions, and dilated pulmonary artery. 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 Echocardiogram showed LVEF reduced to 30-35%, mild LVH, GLS of -6.6%, RV function mildly reduced with moderately elevate PA systolic pressure, moderate MR, mild AR, moderate TR.  Pertinent Lab Values: Creatinine  Date Value Ref Range Status  03/22/2014 0.64 0.60 - 1.30 mg/dL Final   Creatinine, Ser  Date Value Ref Range Status  10/08/2023 0.72 0.44 - 1.00 mg/dL Final   BUN  Date Value Ref Range Status  10/08/2023 12 6 - 20 mg/dL Final  40/98/1191 8 7 - 18 mg/dL Final   Potassium  Date Value Ref Range Status  10/08/2023 3.6 3.5 - 5.1 mmol/L Final  03/22/2014 3.5 3.5 - 5.1 mmol/L Final   Sodium  Date Value Ref Range Status  10/08/2023 137 135 - 145 mmol/L Final  03/22/2014 137 136 - 145 mmol/L Final   B Natriuretic Peptide  Date Value Ref Range Status  10/06/2023 235.0 (H) 0.0 - 100.0 pg/mL Final    Comment:    Performed at [facility], [address] [place], [place] [address]   Magnesium  Date Value Ref Range Status  08/26/2022 2.2 1.7 - 2.4 mg/dL Final    Comment:    Performed at [facility], [address] [place], [place] [address]   Hgb A1c MFr Bld  Date Value Ref Range Status  01/15/2023 15.4 (H) 4.8 - 5.6 % Final    Comment:    (NOTE)         Prediabetes: 5.7 - 6.4          Diabetes: >6.4         Glycemic control for adults with diabetes: <7.0    TSH  Date Value Ref Range Status  10/07/2023 0.658 0.350 - 4.500 uIU/mL Final    Comment:    Performed by a 3rd Generation assay with a functional sensitivity of <=0.01 uIU/mL. Performed at [facility], [address] [place], [place] [address]     Vital Signs:  Temp:  [97.9 F (36.6 C)-98.2 F (36.8 C)] 98.1 F (36.7 C) (04/09 0739) Pulse Rate:  [66-124] 105 (04/09 0739) Cardiac Rhythm: Sinus tachycardia (04/09 0028) Resp:  [16-27] 18 (04/09 0739) BP: (92-166)/(72-114) 128/91 (04/09 0739) SpO2:  [85 %-100 %] 100 % (04/09 0739) No intake or output data in the 24 hours ending 10/08/23 0806  Current Heart Failure Medications:  Loop diuretic: furosemide 40 mg IV daily Beta-Blocker: none ACEI/ARB/ARNI: losartan 12.5 mg daily MRA: none SGLT2i: none Other: none  Prior to admission Heart Failure Medications:  None  Assessment: 1. Acute systolic heart failure (LVEF 30-35%) with mildly reduced RV function, due to unknown etiology. NYHA class III symptoms.  -Symptoms: Reports shortness of breath has improved significantly. Presented with abdominal tightness that has improved with diuresis. Appetite is good. Denies LEE.  -Volume: Volume status is improving on furosemide 40 mg IV daily per patient report and symptoms, though no daily weight or I/Os.  Remains on 2L O2. Creatinine and BUN decreased with diuresis. Would continue IV diuresis today. -Hemodynamics: BP generally elevated with one low reading. -BB: Can consider adding metoprolol succinate when euvolemic. -ACEI/ARB/ARNI: Continue losartan 12.5 mg daily at this time. -MRA: Consider adding spironolactone 12.5 mg daily, K <4. -SGLT2i: Consider adding SGLT2i prior to discharge. Denies UTIs. Previously on Jardiance, but stopped for cost.  -Cardiology considering work up with cath and CMRI.  Plan: 1) Medication changes recommended at this  time: -Consider adding spironolactone 12.5 mg daily  2) Patient assistance: -Copays are $30 for Clifton Custard, Jardiance. Copay cards can be applied.  3) Education: - Patient has been educated on current HF medications and potential additions to HF medication regimen - Patient verbalizes understanding that over the next few months, these medication doses may change and more medications may be added to optimize HF regimen - Patient has been educated on basic disease state pathophysiology and goals of therapy  Medication Assistance / Insurance Benefits Check: Does the patient have prescription insurance?    Type of insurance plan:  Does the patient qualify for medication assistance through manufacturers or grants? Pending  -Patient will qualify for copay cards  Outpatient Pharmacy: [facility]      Please do not hesitate to reach out with questions or concerns,  Enos Fling, PharmD, CPP, BCPS Heart Failure Pharmacist  Phone - [phone] 10/08/2023 11:58 AM

## 2023-10-08 NOTE — Progress Notes (Signed)
 Rounding Note    Patient Name: Mary Velasquez Date of Encounter: 10/08/2023  [facility] Cardiologist: Yvonne Kendall, MD   Subjective   Patient reports feeling well today without ongoing lightheadedness/has. Denies chest pain. Heart rate remains elevated which seems to be baseline for patient. Echo done yesterday shows newly reduced ejection fraction 30 to 35%. Plan to proceed with [facility] tentatively scheduled for tomorrow.   Inpatient Medications    Scheduled Meds:  enoxaparin (LOVENOX) injection  40 mg Subcutaneous Q24H   ferrous sulfate  325 mg Oral Q breakfast   furosemide  40 mg Intravenous Daily   gabapentin  600 mg Oral QHS   insulin aspart  0-15 Units Subcutaneous TID WC   insulin glargine-yfgn  4 Units Subcutaneous QHS   losartan  12.5 mg Oral Daily   metoprolol succinate  25 mg Oral Daily   nortriptyline  100 mg Oral QHS   sodium chloride flush  3 mL Intravenous Q12H   Continuous Infusions:  PRN Meds: acetaminophen **OR** acetaminophen, ondansetron **OR** ondansetron (ZOFRAN) IV, polyethylene glycol   Vital Signs    Vitals:   10/08/23 1128 10/08/23 1411 10/08/23 1412 10/08/23 1415  BP: 129/85 (!) 164/108 (!) 140/97 114/84  Pulse: (!) 109 (!) 111 (!) 112 (!) 111  Resp: 18     Temp: 97.6 F (36.4 C)     TempSrc:      SpO2: 95% (!) 89% (!) 89% 100%  Weight:      Height:        Intake/Output Summary (Last 24 hours) at 10/08/2023 1416 Last data filed at 10/08/2023 1054 Gross per 24 hour  Intake 240 ml  Output --  Net 240 ml      10/06/2023    8:54 AM 08/21/2023    7:30 PM 08/21/2023   12:45 PM  Last 3 Weights  Weight (lbs) 160 lb 179 lb 14.3 oz 179 lb 14.3 oz  Weight (kg) 72.576 kg 81.6 kg 81.6 kg      Telemetry    Sinus tachycardia rate 100-120 bpm - Personally Reviewed  Physical Exam   GEN: No acute distress.   Neck: No JVD Cardiac: RRR, no murmurs, rubs, or gallops.  Respiratory: Clear to auscultation bilaterally. GI: Soft,  nontender, non-distended  MS: No edema; No deformity. Neuro:  Nonfocal  Psych: Normal affect   Labs    High Sensitivity Troponin:   Recent Labs  Lab 10/06/23 1013 10/06/23 1223  TROPONINIHS 33* 35*     Chemistry Recent Labs  Lab 10/06/23 1013 10/07/23 0547 10/08/23 0507  NA 133* 139 137  K 4.4 3.4* 3.6  CL 99 105 102  CO2 26 27 30   GLUCOSE 162* 60* 116*  BUN 15 14 12   CREATININE 1.12* 0.85 0.72  CALCIUM 8.1* 8.1* 7.9*  GFRNONAA >60 >60 >60  ANIONGAP 8 7 5     Lipids No results for input(s): "CHOL", "TRIG", "HDL", "LABVLDL", "LDLCALC", "CHOLHDL" in the last 168 hours.  Hematology Recent Labs  Lab 10/06/23 1013 10/07/23 0547  WBC 9.0 6.8  RBC 3.82* 3.57*  HGB 10.6* 9.9*  HCT 33.1* 30.2*  MCV 86.6 84.6  MCH 27.7 27.7  MCHC 32.0 32.8  RDW 13.5 13.7  PLT 299 296   Thyroid  Recent Labs  Lab 10/07/23 0547  TSH 0.658    BNP Recent Labs  Lab 10/06/23 1013  BNP 235.0*    DDimer  Recent Labs  Lab 10/06/23 1013  DDIMER 0.84*  Radiology    MR BRAIN WO CONTRAST Result Date: 10/06/2023 IMPRESSION: 1. No acute intracranial abnormality. 2. Small chronic cerebellar infarcts. 3. Partially empty sella, often reflecting normal anatomic variation though can also be seen with idiopathic intracranial hypertension. Electronically Signed   By: Sebastian Ache M.D.   On: 10/06/2023 17:02   Cardiac Studies   10/07/2023 Echo complete 1. Left ventricular ejection fraction, by estimation, is 30 to 35%. The  left ventricle has moderately decreased function. The left ventricle  demonstrates global hypokinesis. There is mild left ventricular  hypertrophy. Indeterminate diastolic filling due   to E-A fusion. The average left ventricular global longitudinal strain is  -6.6 %. The global longitudinal strain is abnormal.   2. Right ventricular systolic function is mildly reduced. The right  ventricular size is normal. There is moderately elevated pulmonary artery  systolic  pressure.   3. The mitral valve is normal in structure. Moderate mitral valve  regurgitation. No evidence of mitral stenosis.   4. Tricuspid valve regurgitation is mild to moderate.   5. The aortic valve is tricuspid. Aortic valve regurgitation is mild.   6. The inferior vena cava is dilated in size with <50% respiratory  variability, suggesting right atrial pressure of 15 mmHg.   11/21/2022 Echo complete 1. Left ventricular ejection fraction, by estimation, is 60 to 65%. The  left ventricle has normal function. The left ventricle has no regional  wall motion abnormalities. There is mild left ventricular hypertrophy.  Left ventricular diastolic parameters  are consistent with Grade I diastolic dysfunction (impaired relaxation).   2. Right ventricular systolic function is normal. The right ventricular  size is normal.   3. A small pericardial effusion is present. There is no evidence of  cardiac tamponade.   4. The mitral valve is normal in structure. No evidence of mitral valve  regurgitation. No evidence of mitral stenosis.   5. The aortic valve is tricuspid. Aortic valve regurgitation is not  visualized. No aortic stenosis is present.   6. The inferior vena cava is normal in size with greater than 50%  respiratory variability, suggesting right atrial pressure of 3 mmHg.   Patient Profile     Mary Velasquez is a 43 y.o. female with a hx of T2DM with neuropathy, hysterectomy complicated by post-op anemia requiring transfusion 08/2023, inappropriate sinus tachycardia, brain AVM who is being seen for the further evaluation of elevated troponin, syncope, and newly reduced EF.   Assessment & Plan    HFrEF Elevated troponin - Troponin mildly elevated at 33>35 - Denies chest pain - EKG without acute ischemic changes - Prior echo 10/2022 with EF of 60 to 65%, G1 DD, and small pericardial effusion  - Echo this admission shows EF 30 to 35%, global hypokinesis mild left ventricular  hypertrophy, RV systolic function mildly reduced, mild MR, mild to mod TR, dilated inferior vena cava suggesting RA pressure 15 mmHg - Unclear etiology of newly reduced EF, consider tachycardia induced cardiomyopathy, sarcoid, or less likely ischemic. However, cannot rule out ischemic cardiomyopathy at this time.  - Discussed options for ischemic evaluation with patient including coronary CTA versus cardiac catheterization.  Patient expresses that she would like to know definitively if she has coronary disease. - Plan to proceed with right and left heart cardiac catheterization tentatively scheduled for tomorrow 4/10.  N.p.o. after midnight. - Does not appear grossly hypervolemic on exam - Continue IV Lasix 40 mg daily - Continue to monitor kidney function, strict I's/O's, and  daily weights with ongoing diuresis - Continue losartan 12.5 mg daily - Start metoprolol succinate 25 mg daily - Continue to escalate GDMT as BP and renal function allow - Consider cardiac MRI in the outpatient setting to evaluate for infiltrative disease Informed Consent   Shared Decision Making/Informed Consent The risks [stroke (1 in 1000), death (1 in 1000), kidney failure [usually temporary] (1 in 500), bleeding (1 in 200), allergic reaction [possibly serious] (1 in 200)], benefits (diagnostic support and management of coronary artery disease) and alternatives of a cardiac catheterization were discussed in detail with Ms. Aerts and she is willing to proceed.     Syncope - Presented 4/7 for several days of dizziness/lightheaded - Echo this admission shows EF 30 to 35%, global hypokinesis mild left ventricular hypertrophy, RV systolic function mildly reduced, mild MR, mild to mod TR, dilated inferior vena cava suggesting RA pressure 15 mmHg - Orthostatics negative - Telemetry without sign of significant arrhythmia - Unclear etiology for syncopal episode, although suspect patient may have been hypotensive - Consider ZIO  monitor on discharge  Sinus tachycardia - Diagnosis of inappropriate sinus tachycardia, previously on ivabradine. However, this medication seems to have fallen off patient's list. - TSH within normal limits, UDS negative - Continue metoprolol as above - Consider outpatient evaluation for OSA  For questions or updates, please contact [facility] Please consult [email or web address] for contact info under        Signed, Orion Crook, PA-C  10/08/2023, 2:16 PM

## 2023-10-08 NOTE — Progress Notes (Signed)
 Progress Note   Patient: Mary Velasquez:[id number] DOB: Jun 13, 1981 DOA: 10/06/2023     1 DOS: the patient was seen and examined on 10/08/2023   Brief hospital course: Mary Velasquez is a 43 y.o. female with medical history significant of type 2 diabetes, recent hysterectomy complicated by post-op anemia requiring blood transfusion, inappropriate sinus tachycardia, brain AVM, peripheral neuropathy, who presents to the ED due to dizziness and syncope.    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  When she came to, she was laying on the ground and the resident was yelling her name.  She denies any prodromal symptoms other than persistent dizziness prior to the syncope episode.  She notes in the past, when she had dizziness or syncope, was due to heavy menstrual cycles but she has not had any menstrual cycle since hysterectomy.  She endorses chronic lower extremity swelling that is unchanged and new abdominal swelling that has been occurring for the last few months.  She denies any orthopnea, shortness of breath, chest pain, palpitations.  She denies any recent illness.   ED Course:  On arrival to the ED, patient was normotensive at 127/91 with heart rate of 108. She was saturating at 93% on room air. She was afebrile at 98. Initial work up notable for hemoglobin of 10.6, sodium 133, creatinine 112, glucose 162 GFR above 60.  Troponin 33 none 35.  D-dimer elevated 0.84.  CTA obtained with no evidence of PE, however Multichamber cardiomegaly with reflux of contrast into hepatic veins suggesting right heart dysfunction with patchy mosaic attenuation and groundglass opacities concerning for pulmonary edema.  CTA of the head was obtained with no evidence of acute intracranial  abnormality.  Patient started on aspirin, IV fluids, meclizine and Zofran.  TRH contacted for admission.   Assessment and Plan:  * Syncope Patient presented for evaluation of a 1 day history of dizziness with multiple falls and an episode of syncope today at work.  Patient 2D echocardiogram shows an LVEF of 30 to 35% with moderately decreased LV systolic function and global hypokinesis.  Right ventricular systolic function is mildly reduced with moderately elevated pulmonary artery systolic pressure Appreciate cardiology input     Elevated troponin On presentation, patient's troponins were noted to be mildly elevated indicating demand ischemia.   CTA was obtained to rule out PE and notable for concern of right heart dysfunction, findings suggestive of pulmonary hypertension and pulmonary edema.  Appreciate cardiology input, 2D echocardiogram shows a reduced LVEF of 30 to 35% with LV global hypokinesis. Patient may benefit from a cardiac catheterization for further evaluation and possible cardiac MRI.        Acute systolic CHF Patient presented to the ER for evaluation after a witnessed syncopal episode at work CT angiogram showed multi chamber cardiomegaly with reflux of contrast material into the hepatic veins, suggesting a degree of right heart dysfunction. Patchy mosaic attenuation and diffuse peri bronchovascular ground-glass opacities, which may be seen in the setting of pulmonary edema. Small bilateral pleural effusions. Dilated main pulmonary artery, which can be seen in the setting of pulmonary hypertension. 2D echocardiogram showed an LVEF of 30 to 35% with moderately decreased LV function and LV global hypokinesis. Continue Lasix 40  mg IV daily         Hypoxia Patient was noted to have room air pulse oximetry post ambulation of 85% and is currently on 2 L of oxygen to maintain pulse oximetry greater than 92%. Hypoxia appears to be secondary to acute systolic CHF Will attempt  to wean patient off oxygen once acute illness improves or resolves.         History of inappropriate sinus tachycardia Will start patient on beta-blockers once acute CHF exacerbation improves     Normocytic anemia History of chronic anemia, initially due to chronic blood loss and then postoperative blood loss.   Hemoglobin appears stable at this time        DM (diabetes mellitus), type 2 with neurological complications (HCC) Continue Semglee 4 units at bedtime with sliding scale coverage Maintain consistent carbohydrate diet     Obesity,Class 1 Complicates overall prognosis and care. Lifestyle modification exercise has been discussed with patient in detail.     Subjective: Patient is seen and examined at the bedside.  She is sleeping but arouses easily.  Physical Exam: Vitals:   10/08/23 0237 10/08/23 0410 10/08/23 0739 10/08/23 1128  BP: 92/72 106/83 (!) 128/91 129/85  Pulse: (!) 109 (!) 106 (!) 105 (!) 109  Resp: 18 17 18 18   Temp: 97.9 F (36.6 C)  98.1 F (36.7 C) 97.6 F (36.4 C)  TempSrc:      SpO2: 99% 99% 100% 95%  Weight:      Height:       Constitutional:      General: She is not in acute distress.    Appearance: She is obese. She is not toxic-appearing.  HENT:     Head: Normocephalic and atraumatic.     Mouth/Throat:     Mouth: Mucous membranes are moist.     Pharynx: Oropharynx is clear.  Eyes:     Extraocular Movements: Extraocular movements intact.     Conjunctiva/sclera: Conjunctivae normal.  Cardiovascular:     Rate and Rhythm: Regular rhythm. Tachycardia present.     Heart sounds: No murmur heard.    No gallop.  Pulmonary:     Effort: Pulmonary effort is normal. No respiratory distress.     Breath sounds: Normal breath sounds. No wheezing, rhonchi or rales.  Abdominal:     General: Bowel sounds are normal.  Central adiposity    Palpations: Abdomen is soft.     Tenderness: There is no abdominal tenderness. There is no guarding.   Musculoskeletal:     Right lower leg: No edema.     Left lower leg: No edema.  Skin:    General: Skin is warm and dry.  Neurological:     Mental Status: She is alert.     Comments:  No facial asymmetry or dysarthria. Strength equal and intact throughout Sensation grossly intact Normal finger-to-nose bilaterally  Psychiatric:        Mood and Affect: Mood normal.        Behavior: Behavior normal.    Data Reviewed:  Labs reviewed  Family Communication: Plan of care was discussed with patient in detail.  She verbalizes understanding and agrees with the plan.  Disposition: Status is: Inpatient Remains inpatient appropriate because: On IV diuretics for acute systolic CHF  Planned Discharge Destination: Home    Time spent: 38  minutes  Author: Lucile Shutters, MD 10/08/2023 12:39 PM  For on call review [email or web address].

## 2023-10-08 NOTE — Plan of Care (Signed)
  Problem: Education: Goal: Knowledge of condition and prescribed therapy will improve Outcome: Progressing   

## 2023-10-08 NOTE — Discharge Instructions (Addendum)
 Some PCP options in [place] area- not a comprehensive list  [facility]- [phone] [facility]- [phone] [facility]- [phone] [facility]- [phone] [facility]- [phone] [facility]- [phone]  or [facility] Physician Referral Line [phone]

## 2023-10-08 NOTE — TOC Initial Note (Signed)
 Transition of Care [facility]) - Initial/Assessment Note    Patient Details  Name: Mary Velasquez MRN: [id number] Date of Birth: Feb 10, 1981  [facility]) CM/SW Contact:    Truddie Hidden, RN Phone Number: 10/08/2023, 3:29 PM  Clinical Narrative:                 Patient does not have a PCP PCP list added to AVS.        Patient Goals and CMS Choice            Expected Discharge Plan and Services                                              Prior Living Arrangements/Services                       Activities of Daily Living   ADL Screening (condition at time of admission) Independently performs ADLs?: Yes (appropriate for developmental age) Is the patient deaf or have difficulty hearing?: No Does the patient have difficulty seeing, even when wearing glasses/contacts?: No Does the patient have difficulty concentrating, remembering, or making decisions?: No  Permission Sought/Granted                  Emotional Assessment              Admission diagnosis:  Syncope and collapse [R55] Ataxia [R27.0] Syncope [R55] Abnormal CT of the chest [R93.89] Patient Active Problem List   Diagnosis Date Noted   Syncope and collapse 10/07/2023   Inappropriate sinus tachycardia (HCC) 10/07/2023   Elevated troponin 10/06/2023   Postoperative state 08/21/2023   Post-operative state 08/21/2023   Normocytic anemia 01/15/2023   Menorrhagia 01/15/2023   Diabetic polyneuropathy (HCC) 01/15/2023   Syncope 01/15/2023   Sinus tachycardia 01/15/2023   History of arteriovenous malformation (AVM) 01/15/2023   Chronic painful diabetic neuropathy (HCC) 12/06/2020   Peripheral neurogenic pain 12/06/2020   Chronic peripheral neuropathic pain 12/06/2020   DM (diabetes mellitus), type 2 with neurological complications (HCC) 12/06/2020   Abnormal MRI, cervical spine (05/14/2020) 12/06/2020   Abnormal MRI, lumbar spine (11/18/2019) 12/06/2020   Chronic  idiopathic constipation 12/05/2020   Dyspepsia 12/05/2020   Gastroesophageal reflux disease without esophagitis 12/05/2020   LUQ pain 12/05/2020   RUQ pain 12/05/2020   Chronic hip pain (Right) 09/27/2020   Chronic low back pain (Bilateral) w/ sciatica (Right) 09/27/2020   Chronic lower extremity pain (Right) 09/27/2020   Foot numbness (Right) 09/27/2020   Loss of sensation of saddle area and right foot 09/27/2020   Lumbar facet hypertrophy 09/27/2020   Paresthesia of lower extremity (Bilateral) 09/27/2020   Neurogenic urinary incontinence 09/27/2020   Multilevel spine pain 09/27/2020   Cervical disc herniation (C3-4, C4-5, C5-6 and C6-7) 09/27/2020   DDD (degenerative disc disease), cervical 09/27/2020   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 [place] [address] Phone: [phone] Fax: [phone]     Social Drivers of Health (SDOH) Social History: SDOH Screenings   Food Insecurity: No Food Insecurity (10/07/2023)  Housing: Low Risk  (10/07/2023)  Transportation Needs: No Transportation Needs (10/07/2023)  Utilities: Not At Risk (10/07/2023)  Financial Resource Strain: Low Risk  (04/17/2023)   Received from [facility]  Tobacco Use: Low Risk  (10/06/2023)   SDOH Interventions:     Readmission Risk Interventions     No data to display

## 2023-10-08 NOTE — H&P (View-Only) (Signed)
 Rounding Note    Patient Name: Mary Velasquez Date of Encounter: 10/08/2023  [facility] Cardiologist: Yvonne Kendall, MD   Subjective   Patient reports feeling well today without ongoing lightheadedness/has. Denies chest pain. Heart rate remains elevated which seems to be baseline for patient. Echo done yesterday shows newly reduced ejection fraction 30 to 35%. Plan to proceed with [facility] tentatively scheduled for tomorrow.   Inpatient Medications    Scheduled Meds:  enoxaparin (LOVENOX) injection  40 mg Subcutaneous Q24H   ferrous sulfate  325 mg Oral Q breakfast   furosemide  40 mg Intravenous Daily   gabapentin  600 mg Oral QHS   insulin aspart  0-15 Units Subcutaneous TID WC   insulin glargine-yfgn  4 Units Subcutaneous QHS   losartan  12.5 mg Oral Daily   metoprolol succinate  25 mg Oral Daily   nortriptyline  100 mg Oral QHS   sodium chloride flush  3 mL Intravenous Q12H   Continuous Infusions:  PRN Meds: acetaminophen **OR** acetaminophen, ondansetron **OR** ondansetron (ZOFRAN) IV, polyethylene glycol   Vital Signs    Vitals:   10/08/23 1128 10/08/23 1411 10/08/23 1412 10/08/23 1415  BP: 129/85 (!) 164/108 (!) 140/97 114/84  Pulse: (!) 109 (!) 111 (!) 112 (!) 111  Resp: 18     Temp: 97.6 F (36.4 C)     TempSrc:      SpO2: 95% (!) 89% (!) 89% 100%  Weight:      Height:        Intake/Output Summary (Last 24 hours) at 10/08/2023 1416 Last data filed at 10/08/2023 1054 Gross per 24 hour  Intake 240 ml  Output --  Net 240 ml      10/06/2023    8:54 AM 08/21/2023    7:30 PM 08/21/2023   12:45 PM  Last 3 Weights  Weight (lbs) 160 lb 179 lb 14.3 oz 179 lb 14.3 oz  Weight (kg) 72.576 kg 81.6 kg 81.6 kg      Telemetry    Sinus tachycardia rate 100-120 bpm - Personally Reviewed  Physical Exam   GEN: No acute distress.   Neck: No JVD Cardiac: RRR, no murmurs, rubs, or gallops.  Respiratory: Clear to auscultation bilaterally. GI: Soft,  nontender, non-distended  MS: No edema; No deformity. Neuro:  Nonfocal  Psych: Normal affect   Labs    High Sensitivity Troponin:   Recent Labs  Lab 10/06/23 1013 10/06/23 1223  TROPONINIHS 33* 35*     Chemistry Recent Labs  Lab 10/06/23 1013 10/07/23 0547 10/08/23 0507  NA 133* 139 137  K 4.4 3.4* 3.6  CL 99 105 102  CO2 26 27 30   GLUCOSE 162* 60* 116*  BUN 15 14 12   CREATININE 1.12* 0.85 0.72  CALCIUM 8.1* 8.1* 7.9*  GFRNONAA >60 >60 >60  ANIONGAP 8 7 5     Lipids No results for input(s): "CHOL", "TRIG", "HDL", "LABVLDL", "LDLCALC", "CHOLHDL" in the last 168 hours.  Hematology Recent Labs  Lab 10/06/23 1013 10/07/23 0547  WBC 9.0 6.8  RBC 3.82* 3.57*  HGB 10.6* 9.9*  HCT 33.1* 30.2*  MCV 86.6 84.6  MCH 27.7 27.7  MCHC 32.0 32.8  RDW 13.5 13.7  PLT 299 296   Thyroid  Recent Labs  Lab 10/07/23 0547  TSH 0.658    BNP Recent Labs  Lab 10/06/23 1013  BNP 235.0*    DDimer  Recent Labs  Lab 10/06/23 1013  DDIMER 0.84*  Radiology    MR BRAIN WO CONTRAST Result Date: 10/06/2023 IMPRESSION: 1. No acute intracranial abnormality. 2. Small chronic cerebellar infarcts. 3. Partially empty sella, often reflecting normal anatomic variation though can also be seen with idiopathic intracranial hypertension. Electronically Signed   By: Sebastian Ache M.D.   On: 10/06/2023 17:02   Cardiac Studies   10/07/2023 Echo complete 1. Left ventricular ejection fraction, by estimation, is 30 to 35%. The  left ventricle has moderately decreased function. The left ventricle  demonstrates global hypokinesis. There is mild left ventricular  hypertrophy. Indeterminate diastolic filling due   to E-A fusion. The average left ventricular global longitudinal strain is  -6.6 %. The global longitudinal strain is abnormal.   2. Right ventricular systolic function is mildly reduced. The right  ventricular size is normal. There is moderately elevated pulmonary artery  systolic  pressure.   3. The mitral valve is normal in structure. Moderate mitral valve  regurgitation. No evidence of mitral stenosis.   4. Tricuspid valve regurgitation is mild to moderate.   5. The aortic valve is tricuspid. Aortic valve regurgitation is mild.   6. The inferior vena cava is dilated in size with <50% respiratory  variability, suggesting right atrial pressure of 15 mmHg.   11/21/2022 Echo complete 1. Left ventricular ejection fraction, by estimation, is 60 to 65%. The  left ventricle has normal function. The left ventricle has no regional  wall motion abnormalities. There is mild left ventricular hypertrophy.  Left ventricular diastolic parameters  are consistent with Grade I diastolic dysfunction (impaired relaxation).   2. Right ventricular systolic function is normal. The right ventricular  size is normal.   3. A small pericardial effusion is present. There is no evidence of  cardiac tamponade.   4. The mitral valve is normal in structure. No evidence of mitral valve  regurgitation. No evidence of mitral stenosis.   5. The aortic valve is tricuspid. Aortic valve regurgitation is not  visualized. No aortic stenosis is present.   6. The inferior vena cava is normal in size with greater than 50%  respiratory variability, suggesting right atrial pressure of 3 mmHg.   Patient Profile     Mary Velasquez is a 43 y.o. female with a hx of T2DM with neuropathy, hysterectomy complicated by post-op anemia requiring transfusion 08/2023, inappropriate sinus tachycardia, brain AVM who is being seen for the further evaluation of elevated troponin, syncope, and newly reduced EF.   Assessment & Plan    HFrEF Elevated troponin - Troponin mildly elevated at 33>35 - Denies chest pain - EKG without acute ischemic changes - Prior echo 10/2022 with EF of 60 to 65%, G1 DD, and small pericardial effusion  - Echo this admission shows EF 30 to 35%, global hypokinesis mild left ventricular  hypertrophy, RV systolic function mildly reduced, mild MR, mild to mod TR, dilated inferior vena cava suggesting RA pressure 15 mmHg - Unclear etiology of newly reduced EF, consider tachycardia induced cardiomyopathy, sarcoid, or less likely ischemic. However, cannot rule out ischemic cardiomyopathy at this time.  - Discussed options for ischemic evaluation with patient including coronary CTA versus cardiac catheterization.  Patient expresses that she would like to know definitively if she has coronary disease. - Plan to proceed with right and left heart cardiac catheterization tentatively scheduled for tomorrow 4/10.  N.p.o. after midnight. - Does not appear grossly hypervolemic on exam - Continue IV Lasix 40 mg daily - Continue to monitor kidney function, strict I's/O's, and  daily weights with ongoing diuresis - Continue losartan 12.5 mg daily - Start metoprolol succinate 25 mg daily - Continue to escalate GDMT as BP and renal function allow - Consider cardiac MRI in the outpatient setting to evaluate for infiltrative disease Informed Consent   Shared Decision Making/Informed Consent The risks [stroke (1 in 1000), death (1 in 1000), kidney failure [usually temporary] (1 in 500), bleeding (1 in 200), allergic reaction [possibly serious] (1 in 200)], benefits (diagnostic support and management of coronary artery disease) and alternatives of a cardiac catheterization were discussed in detail with Ms. Aerts and she is willing to proceed.     Syncope - Presented 4/7 for several days of dizziness/lightheaded - Echo this admission shows EF 30 to 35%, global hypokinesis mild left ventricular hypertrophy, RV systolic function mildly reduced, mild MR, mild to mod TR, dilated inferior vena cava suggesting RA pressure 15 mmHg - Orthostatics negative - Telemetry without sign of significant arrhythmia - Unclear etiology for syncopal episode, although suspect patient may have been hypotensive - Consider ZIO  monitor on discharge  Sinus tachycardia - Diagnosis of inappropriate sinus tachycardia, previously on ivabradine. However, this medication seems to have fallen off patient's list. - TSH within normal limits, UDS negative - Continue metoprolol as above - Consider outpatient evaluation for OSA  For questions or updates, please contact [facility] Please consult [email or web address] for contact info under        Signed, Orion Crook, PA-C  10/08/2023, 2:16 PM

## 2023-10-09 ENCOUNTER — Encounter: Payer: Self-pay | Admitting: Internal Medicine

## 2023-10-09 ENCOUNTER — Encounter
Admission: EM | Disposition: A | Discharge status: Home / Self Care | Payer: Self-pay | Source: Home / Self Care | Attending: Internal Medicine

## 2023-10-09 DIAGNOSIS — I5041 Acute combined systolic (congestive) and diastolic (congestive) heart failure: Secondary | ICD-10-CM | POA: Diagnosis not present

## 2023-10-09 DIAGNOSIS — I5021 Acute systolic (congestive) heart failure: Secondary | ICD-10-CM | POA: Diagnosis not present

## 2023-10-09 DIAGNOSIS — R55 Syncope and collapse: Secondary | ICD-10-CM | POA: Diagnosis not present

## 2023-10-09 DIAGNOSIS — I420 Dilated cardiomyopathy: Secondary | ICD-10-CM | POA: Diagnosis not present

## 2023-10-09 HISTORY — PX: RIGHT/LEFT HEART CATH AND CORONARY ANGIOGRAPHY: CATH118266

## 2023-10-09 LAB — BASIC METABOLIC PANEL WITH GFR
Anion gap: 9 (ref 5–15)
BUN: 14 mg/dL (ref 6–20)
CO2: 29 mmol/L (ref 22–32)
Calcium: 7.7 mg/dL — ABNORMAL LOW (ref 8.9–10.3)
Chloride: 99 mmol/L (ref 98–111)
Creatinine, Ser: 0.74 mg/dL (ref 0.44–1.00)
GFR, Estimated: >60 mL/min (ref >60)
Glucose, Bld: 157 mg/dL — ABNORMAL HIGH (ref 70–99)
Potassium: 3.5 mmol/L (ref 3.5–5.1)
Sodium: 137 mmol/L (ref 135–145)

## 2023-10-09 LAB — CBC
HCT: 33.9 % — ABNORMAL LOW (ref 36.0–46.0)
Hemoglobin: 11.1 g/dL — ABNORMAL LOW (ref 12.0–15.0)
MCH: 28.4 pg (ref 26.0–34.0)
MCHC: 32.7 g/dL (ref 30.0–36.0)
MCV: 86.7 fL (ref 80.0–100.0)
Platelets: 333 10*3/uL (ref 150–400)
RBC: 3.91 MIL/uL (ref 3.87–5.11)
RDW: 13.6 % (ref 11.5–15.5)
WBC: 6.9 10*3/uL (ref 4.0–10.5)
nRBC: 0 % (ref 0.0–0.2)

## 2023-10-09 LAB — THYROID PANEL WITH TSH
Free Thyroxine Index: 1.8 (ref 1.2–4.9)
T3 Uptake Ratio: 27 % (ref 24–39)
T4, Total: 6.5 ug/dL (ref 4.5–12.0)
TSH: 0.756 u[IU]/mL (ref 0.450–4.500)

## 2023-10-09 LAB — POCT I-STAT 7, (LYTES, BLD GAS, ICA,H+H)
Acid-Base Excess: 7 mmol/L — ABNORMAL HIGH (ref 0.0–2.0)
Bicarbonate: 32.4 mmol/L — ABNORMAL HIGH (ref 20.0–28.0)
Calcium, Ion: 1.06 mmol/L — ABNORMAL LOW (ref 1.15–1.40)
HCT: 32 % — ABNORMAL LOW (ref 36.0–46.0)
Hemoglobin: 10.9 g/dL — ABNORMAL LOW (ref 12.0–15.0)
O2 Saturation: 88 %
Potassium: 3.4 mmol/L — ABNORMAL LOW (ref 3.5–5.1)
Sodium: 141 mmol/L (ref 135–145)
TCO2: 34 mmol/L — ABNORMAL HIGH (ref 22–32)
pCO2 arterial: 47.1 mmHg (ref 32–48)
pH, Arterial: 7.445 (ref 7.35–7.45)
pO2, Arterial: 53 mmHg — ABNORMAL LOW (ref 83–108)

## 2023-10-09 LAB — GLUCOSE, CAPILLARY
Glucose-Capillary: 159 mg/dL — ABNORMAL HIGH (ref 70–99)
Glucose-Capillary: 155 mg/dL — ABNORMAL HIGH (ref 70–99)
Glucose-Capillary: 216 mg/dL — ABNORMAL HIGH (ref 70–99)
Glucose-Capillary: 69 mg/dL — ABNORMAL LOW (ref 70–99)
Glucose-Capillary: 180 mg/dL — ABNORMAL HIGH (ref 70–99)
Glucose-Capillary: 114 mg/dL — ABNORMAL HIGH (ref 70–99)
Glucose-Capillary: 427 mg/dL — ABNORMAL HIGH (ref 70–99)

## 2023-10-09 LAB — POCT I-STAT EG7
Acid-Base Excess: 7 mmol/L — ABNORMAL HIGH (ref 0.0–2.0)
Bicarbonate: 33.1 mmol/L — ABNORMAL HIGH (ref 20.0–28.0)
Calcium, Ion: 1.07 mmol/L — ABNORMAL LOW (ref 1.15–1.40)
HCT: 32 % — ABNORMAL LOW (ref 36.0–46.0)
Hemoglobin: 10.9 g/dL — ABNORMAL LOW (ref 12.0–15.0)
O2 Saturation: 56 %
Potassium: 3.4 mmol/L — ABNORMAL LOW (ref 3.5–5.1)
Sodium: 142 mmol/L (ref 135–145)
TCO2: 35 mmol/L — ABNORMAL HIGH (ref 22–32)
pCO2, Ven: 51.7 mmHg (ref 44–60)
pH, Ven: 7.414 (ref 7.25–7.43)
pO2, Ven: 30 mmHg — CL (ref 32–45)

## 2023-10-09 LAB — CREATININE, SERUM
Creatinine, Ser: 0.86 mg/dL (ref 0.44–1.00)
GFR, Estimated: >60 mL/min (ref >60)

## 2023-10-09 LAB — GLUCOSE, RANDOM: Glucose, Bld: 360 mg/dL — ABNORMAL HIGH (ref 70–99)

## 2023-10-09 SURGERY — RIGHT/LEFT HEART CATH AND CORONARY ANGIOGRAPHY
Anesthesia: Moderate Sedation

## 2023-10-09 MED ORDER — HEPARIN SODIUM (PORCINE) 1000 UNIT/ML IJ SOLN
INTRAMUSCULAR | Status: AC
  Filled 2023-10-09: qty 10

## 2023-10-09 MED ORDER — FENTANYL CITRATE (PF) 100 MCG/2ML IJ SOLN
INTRAMUSCULAR | Status: DC | PRN
  Administered 2023-10-09: 25 ug via INTRAVENOUS

## 2023-10-09 MED ORDER — VERAPAMIL HCL 2.5 MG/ML IV SOLN
INTRAVENOUS | Status: DC | PRN
  Administered 2023-10-09: 2.5 mg via INTRA_ARTERIAL

## 2023-10-09 MED ORDER — FUROSEMIDE 10 MG/ML IJ SOLN
40.0000 mg | Freq: Two times a day (BID) | INTRAMUSCULAR | Status: DC
  Administered 2023-10-09 – 2023-10-10 (×2): 40 mg via INTRAVENOUS
  Filled 2023-10-09: qty 4

## 2023-10-09 MED ORDER — IOHEXOL 300 MG/ML  SOLN
INTRAMUSCULAR | Status: DC | PRN
  Administered 2023-10-09: 42 mL

## 2023-10-09 MED ORDER — LIDOCAINE HCL (PF) 1 % IJ SOLN
INTRAMUSCULAR | Status: DC | PRN
  Administered 2023-10-09 (×2): 2 mL

## 2023-10-09 MED ORDER — VERAPAMIL HCL 2.5 MG/ML IV SOLN
INTRAVENOUS | Status: AC
  Filled 2023-10-09: qty 2

## 2023-10-09 MED ORDER — SODIUM CHLORIDE 0.9 % IV SOLN: 250.0000 mL | INTRAVENOUS | Status: AC | PRN

## 2023-10-09 MED ORDER — HYDRALAZINE HCL 20 MG/ML IJ SOLN: 10.0000 mg | INTRAMUSCULAR | Status: AC | PRN

## 2023-10-09 MED ORDER — FUROSEMIDE 10 MG/ML IJ SOLN
INTRAMUSCULAR | Status: AC
  Filled 2023-10-09: qty 4

## 2023-10-09 MED ORDER — INSULIN ASPART 100 UNIT/ML IJ SOLN
0.0000 [IU] | Freq: Three times a day (TID) | INTRAMUSCULAR | Status: DC
  Administered 2023-10-09: 15 [IU] via SUBCUTANEOUS
  Filled 2023-10-09: qty 1

## 2023-10-09 MED ORDER — SODIUM CHLORIDE 0.9% FLUSH: 3.0000 mL | INTRAVENOUS | Status: DC | PRN

## 2023-10-09 MED ORDER — HEPARIN (PORCINE) IN NACL 1000-0.9 UT/500ML-% IV SOLN
INTRAVENOUS | Status: AC
  Filled 2023-10-09: qty 1000

## 2023-10-09 MED ORDER — HEPARIN (PORCINE) IN NACL 1000-0.9 UT/500ML-% IV SOLN
INTRAVENOUS | Status: DC | PRN
  Administered 2023-10-09: 1000 mL

## 2023-10-09 MED ORDER — FENTANYL CITRATE (PF) 100 MCG/2ML IJ SOLN
INTRAMUSCULAR | Status: AC
  Filled 2023-10-09: qty 2

## 2023-10-09 MED ORDER — LABETALOL HCL 5 MG/ML IV SOLN: 10.0000 mg | INTRAVENOUS | Status: AC | PRN

## 2023-10-09 MED ORDER — LIDOCAINE HCL 1 % IJ SOLN
INTRAMUSCULAR | Status: AC
  Filled 2023-10-09: qty 20

## 2023-10-09 MED ORDER — MIDAZOLAM HCL 2 MG/2ML IJ SOLN
INTRAMUSCULAR | Status: DC | PRN
  Administered 2023-10-09: 1 mg via INTRAVENOUS

## 2023-10-09 MED ORDER — ASPIRIN 81 MG PO CHEW: 81.0000 mg | CHEWABLE_TABLET | ORAL | Status: DC

## 2023-10-09 MED ORDER — SODIUM CHLORIDE 0.9% FLUSH
3.0000 mL | Freq: Two times a day (BID) | INTRAVENOUS | Status: DC
  Administered 2023-10-09 – 2023-10-12 (×6): 3 mL via INTRAVENOUS

## 2023-10-09 MED ORDER — SODIUM CHLORIDE 0.9 % IV SOLN: INTRAVENOUS | Status: DC

## 2023-10-09 MED ORDER — HEPARIN SODIUM (PORCINE) 1000 UNIT/ML IJ SOLN
INTRAMUSCULAR | Status: DC | PRN
  Administered 2023-10-09: 4000 [IU] via INTRAVENOUS

## 2023-10-09 MED ORDER — MIDAZOLAM HCL 2 MG/2ML IJ SOLN
INTRAMUSCULAR | Status: AC
  Filled 2023-10-09: qty 2

## 2023-10-09 MED ORDER — DEXTROSE 50 % IV SOLN
12.5000 g | INTRAVENOUS | Status: AC
  Administered 2023-10-09: 12.5 g via INTRAVENOUS
  Filled 2023-10-09: qty 50

## 2023-10-09 SURGICAL SUPPLY — 13 items
CATH BALLN WEDGE 5F 110CM (CATHETERS) IMPLANT
CATH INFINITI 5 FR JL3.5 (CATHETERS) IMPLANT
CATH INFINITI AMBI 5FR TG (CATHETERS) IMPLANT
DEVICE RAD TR BAND REGULAR (VASCULAR PRODUCTS) IMPLANT
DRAPE BRACHIAL (DRAPES) IMPLANT
GLIDESHEATH SLEND SS 6F .021 (SHEATH) IMPLANT
GUIDEWIRE INQWIRE 1.5J.035X260 (WIRE) IMPLANT
INQWIRE 1.5J .035X260CM (WIRE) ×1 IMPLANT
PACK CARDIAC CATH (CUSTOM PROCEDURE TRAY) ×1 IMPLANT
PROTECTION STATION PRESSURIZED (MISCELLANEOUS) ×1 IMPLANT
SET ATX-X65L (MISCELLANEOUS) IMPLANT
SHEATH GLIDE SLENDER 4/5FR (SHEATH) IMPLANT
STATION PROTECTION PRESSURIZED (MISCELLANEOUS) IMPLANT

## 2023-10-09 NOTE — Progress Notes (Signed)
 Patient A/Ox4 and stable at time of transport back to room from cardiac cath. Right radial and brachial site level 0; dressing clean, dry and intact.  10/09/23 1750  Vitals  Temp 98.4 F (36.9 C)  Temp Source Oral  BP (!) 125/93  MAP (mmHg) 104  BP Location Left Arm  BP Method Automatic  Patient Position (if appropriate) Lying  Pulse Rate (!) 109  Pulse Rate Source Dinamap  ECG Heart Rate (!) 109  Resp 18  MEWS COLOR  MEWS Score Color Green  Oxygen Therapy  SpO2 94 %  O2 Device Room Air  Pain Assessment  Pain Scale 0-10  Pain Score 0

## 2023-10-09 NOTE — Brief Op Note (Signed)
 Brief Right and Left Heart Catheterization Note   NAME: Mary Velasquez  MRN: [id number] Primary Physician: System, Provider Not In Cardiologist:  Yvonne Kendall, MD  10/09/2023 3:46 PM  SURGEON:  Surgeons and Role:    * Marykay Lex, MD - Primary  PROCEDURE:  Procedure(s): RIGHT/LEFT HEART CATH AND CORONARY ANGIOGRAPHY (N/A)   PATIENT:  Mary Velasquez  43 y.o. female admitted for acute heart failure and syncope.  She has been evaluated for intermittent tachycardia with high heart rates and was started on ivabradine.  EF in May 2024 showed normal EF with no wall motion normality.  She underwent hysterectomy back in for very 2025 complicated postop bleeding.  She presented to [facility] ER on 10/06/2023 following an episode of syncope.  Initial evaluation showed minimal troponin elevation of 33 and 35 with CT scan on cardiomegaly and venous reflux into the veins with pulm edema.  Echocardiogram showed new drop in EF of 30 to 35% with global hypokinesis.  Based on new drop in EF and potential concern for ischemic arrhythmia she was referred for right and left heart catheterization.  PRE-OPERATIVE DIAGNOSIS:  heart failure with reduced ejection fraction  POST-OPERATIVE DIAGNOSIS: Angiographically normal coronary arteries with a right dominant system  Mild to moderate WHO Class II Pulm Hypertension: RAP mean 10 mmHg, RV P-EDP 42/6-21 mmHg PAP-mean 43/21-32 millimercury; PCWP mean more consistent with 28 mmHg. LV P-EDP 136/2-36 mmHg; AoP-MAP 134/88-108 mmHg. AO sat 88%, PA sat 57% on room air. Fick cardiac output-index 5.08-2.89. PVR 1 89D/S; index through 3 32D/SI   PROCEDURE PERFORMED: Time Out: Verified patient identification, verified procedure, site/side was marked, verified correct patient position, special equipment/implants available, medications/allergies/relevent history reviewed, required imaging and test results available. Performed.  Access:  RIGHT radial Artery: 6 Fr sheath  -- Seldinger technique using Micropuncture Kit Direct ultrasound guidance used.  Permanent image obtained and placed on chart. 10 mL radial cocktail IA; 4000 units IV Heparin * Right Antecubital Vein: The existing 18-gauge IV was exchanged over a wire for a 5Fr sheath  Right Heart Catheterization: 5 Fr Theone Murdoch catheter advanced under fluoroscopy with balloon inflated to the RA, RV, then PCWP-PA for hemodynamic measurement. Simultaneous FA & PA blood gases checked for SaO2% to calculate FICK CO/CI. Catheter removed completely out of the body with balloon deflated.  Left Heart Catheterization: 5Fr Catheters advanced or exchanged over a J-wire under direct fluoroscopic guidance into the ascending aorta; TIG 4.0 catheter advanced first.  * LV Hemodynamics (no LV Gram) & Right Coronary Artery Cineangiography: TIG 4.0 catheter * Left Coronary Artery Cineangiography: JL 3.5 catheter   Upon completion of Angiogaphy, the catheter was removed completely out of the body over a wire, without complication.  Brachial Sheath(s) removed in the Cath Lab with manual pressure for hemostasis.    Radial sheath removed in the Cardiac Catheterization lab with TR Band placed for hemostasis.  TR Band: 1530 Hours; 13 mL air; reverse Barbeau B  MEDICATIONS * SQ Lidocaine 3mL * Radial Cocktail: 3 mg Verapmil in 10 mL NS * Isovue Contrast: 40 mL * Heparin: 4000 units  ANESTHESIA:   local and IV sedation; 1 mL SQ lidocaine for brachial access, 3 mL for radial access; 1 mg Versed, 25 mg fentanyl  EBL:  <20 mL  DICTATION: .Note written in EPIC  PLAN OF CARE:  Patient will return to nursing unit after TR band removal.  Will order 40 mg IV Lasix twice daily and cardiac MRI.  PATIENT DISPOSITION:  PACU - hemodynamically stable.   Delay start of Pharmacological VTE agent (>24hrs) due to surgical blood loss or risk of bleeding: not applicable    Bryan Lemma, MD

## 2023-10-09 NOTE — Progress Notes (Signed)
 Heart Failure Nurse Navigator Progress Note  PCP: System, Provider Not In PCP-Cardiologist: Yvonne Kendall, MD Admission Diagnosis: Syncope and collapse Ataxia Abnormal CT of the chest Admitted from: Work via [facility]  Presentation:   Mary Velasquez presented to the ED for syncopal episode at work.Patient had a recent hysterectomy in February that required a blood transfusion.  She had felt dizzy the day before admission.  Did complain of disequilibrium,vertigo with intermittent lightheadedness. Patient also complained of falling 3-4 times due to her dizziness since the night before her admission  No chest pain or shortness of breath. BNP 235.0. Troponin 35.  ECHO/ LVEF: 30-35%  Clinical Course:  Past Medical History:  Diagnosis Date   Anemia    AVM (arteriovenous malformation)    Diabetes mellitus without complication (HCC)    History of kidney stones    Peripheral neuropathy    Pneumonia    Tachycardia      Social History   Socioeconomic History   Marital status: Single    Spouse name: Not on file   Number of children: 2   Years of education: Not on file   Highest education level: Some college, no degree  Occupational History   Not on file  Tobacco Use   Smoking status: Never   Smokeless tobacco: Never  Vaping Use   Vaping status: Never Used  Substance and Sexual Activity   Alcohol use: No   Drug use: Not Currently   Sexual activity: Not on file  Other Topics Concern   Not on file  Social History Narrative   Lives alone   Social Drivers of Health   Financial Resource Strain: High Risk (10/09/2023)   Overall Financial Resource Strain (CARDIA)    Difficulty of Paying Living Expenses: Hard  Food Insecurity: No Food Insecurity (10/09/2023)   Hunger Vital Sign    Worried About Running Out of Food in the Last Year: Never true    Ran Out of Food in the Last Year: Never true  Transportation Needs: No Transportation Needs (10/09/2023)   PRAPARE - Therapist, art (Medical): No    Lack of Transportation (Non-Medical): No  Physical Activity: Not on file  Stress: Not on file  Social Connections: Not on file   Education Assessment and Provision:  Detailed education and instructions provided on heart failure disease management including the following:  Signs and symptoms of Heart Failure When to call the physician Importance of daily weights Low sodium diet Fluid restriction Medication management Anticipated future follow-up appointments  Patient education given on each of the above topics.  Patient acknowledges understanding via teach back method and acceptance of all instructions.  Education Materials:  "Living Better With Heart Failure" Booklet, HF zone tool, & Daily Weight Tracker Tool.  Patient has scale at home: Yes Patient has pill box at home: Yes    High Risk Criteria for Readmission and/or Poor Patient Outcomes: Heart failure hospital admissions (last 6 months): 0  No Show rate: 14% Difficult social situation: None Demonstrates medication adherence: Yes Primary Language: English Literacy level: Reading, Writing, & Comprehension  Barriers of Care:   None  Considerations/Referrals:   Referral made to Heart Failure Pharmacist Stewardship: Yes Referral made to Heart Failure CSW/NCM TOC: No Referral made to Heart & Vascular TOC clinic: Yes  Items for Follow-up on DC/TOC: Diet & Fluid Restrictions Daily Weights New HF Medication Education Continued Heart Failure Education  Roxy Horseman, RN, BSN [facility] Heart Failure Navigator Secure Chat  Only

## 2023-10-09 NOTE — Progress Notes (Signed)
 Heart Failure Stewardship Pharmacy Note  PCP: System, Provider Not In PCP-Cardiologist: Yvonne Kendall, MD  HPI: Mary Velasquez is a 43 y.o. female with  type 2 diabetes, recent hysterectomy complicated by post-op anemia requiring blood transfusion, inappropriate sinus tachycardia, brain AVM, peripheral neuropathy who presented with dizziness and syncope. On admission, BNP was 235, HS-troponin was 35, UDS positive only for PTA antidepressant, d-dimer 0.84, and TSH 0.658. CT head was negative. CTA chest was negative for PE, but showed cardiomegaly, pulmonary edema, small bilateral pleural effusions, and dilated pulmonary artery. 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 Echocardiogram showed LVEF reduced to 30-35%, mild LVH, GLS of -6.6%, RV function mildly reduced with moderately elevate PA systolic pressure, moderate MR, mild AR, moderate TR.  Pertinent Lab Values: Creatinine  Date Value Ref Range Status  03/22/2014 0.64 0.60 - 1.30 mg/dL Final   Creatinine, Ser  Date Value Ref Range Status  10/08/2023 0.72 0.44 - 1.00 mg/dL Final   BUN  Date Value Ref Range Status  10/08/2023 12 6 - 20 mg/dL Final  28/41/3244 8 7 - 18 mg/dL Final   Potassium  Date Value Ref Range Status  10/08/2023 3.6 3.5 - 5.1 mmol/L Final  03/22/2014 3.5 3.5 - 5.1 mmol/L Final   Sodium  Date Value Ref Range Status  10/08/2023 137 135 - 145 mmol/L Final  03/22/2014 137 136 - 145 mmol/L Final   B Natriuretic Peptide  Date Value Ref Range Status  10/06/2023 235.0 (H) 0.0 - 100.0 pg/mL Final    Comment:    Performed at [facility], [address] [place], [place] [address]   Magnesium  Date Value Ref Range Status  08/26/2022 2.2 1.7 - 2.4 mg/dL Final    Comment:    Performed at [facility], [address] [place], [place] [address]   Hgb A1c MFr Bld  Date Value Ref Range Status  01/15/2023 15.4 (H) 4.8 - 5.6 % Final    Comment:    (NOTE)         Prediabetes: 5.7 -  6.4         Diabetes: >6.4         Glycemic control for adults with diabetes: <7.0    TSH  Date Value Ref Range Status  10/08/2023 0.756 0.450 - 4.500 uIU/mL Final    Vital Signs:  Temp:  [97.6 F (36.4 C)-99.2 F (37.3 C)] 98 F (36.7 C) (04/10 0610) Pulse Rate:  [96-112] 96 (04/10 0610) Cardiac Rhythm: Sinus tachycardia (04/09 2246) Resp:  [17-19] 19 (04/10 0610) BP: (94-164)/(70-108) 94/70 (04/10 0610) SpO2:  [89 %-100 %] 96 % (04/10 0610) Weight:  [78.3 kg (172 lb 9.9 oz)] 78.3 kg (172 lb 9.9 oz) (04/10 0500)  Intake/Output Summary (Last 24 hours) at 10/09/2023 0814 Last data filed at 10/09/2023 0530 Gross per 24 hour  Intake 600 ml  Output --  Net 600 ml    Current Heart Failure Medications:  Loop diuretic: furosemide 40 mg IV daily Beta-Blocker: metoprolol 25 mg daily ACEI/ARB/ARNI: losartan 12.5 mg daily MRA: none SGLT2i: none Other: none  Prior to admission Heart Failure Medications:  None  Plan: 1) Medication changes recommended at this time: -Will sign off and follow alongside AHF team.   Please do not hesitate to reach out with questions or concerns,  Enos Fling, PharmD, CPP, BCPS Heart Failure Pharmacist  Phone - [phone] 10/09/2023 8:14 AM

## 2023-10-09 NOTE — Interval H&P Note (Signed)
 History and Physical Interval Note:  10/09/2023 2:16 PM  Mary Velasquez  has presented today for surgery, with the diagnosis of heart failure with reduced ejection fraction.  The various methods of treatment have been discussed with the patient and family. After consideration of risks, benefits and other options for treatment, the patient has consented to  Procedure(s): RIGHT/LEFT HEART CATH AND CORONARY ANGIOGRAPHY (N/A)  PERCUTANEOUS CORONARY INTERVENTION  as a surgical intervention.  The patient's history has been reviewed, patient examined, no change in status, stable for surgery.  I have reviewed the patient's chart and labs.  Questions were answered to the patient's satisfaction.     Bryan Lemma

## 2023-10-09 NOTE — Plan of Care (Signed)
  Problem: Education: Goal: Knowledge of condition and prescribed therapy will improve Outcome: Progressing   Problem: Cardiac: Goal: Will achieve and/or maintain adequate cardiac output Outcome: Progressing   Problem: Physical Regulation: Goal: Complications related to the disease process, condition or treatment will be avoided or minimized Outcome: Progressing   Problem: Education: Goal: Ability to describe self-care measures that may prevent or decrease complications (Diabetes Survival Skills Education) will improve Outcome: Progressing Goal: Individualized Educational Video(s) Outcome: Progressing   Problem: Coping: Goal: Ability to adjust to condition or change in health will improve Outcome: Progressing   Problem: Fluid Volume: Goal: Ability to maintain a balanced intake and output will improve Outcome: Progressing   Problem: Health Behavior/Discharge Planning: Goal: Ability to identify and utilize available resources and services will improve Outcome: Progressing Goal: Ability to manage health-related needs will improve Outcome: Progressing   Problem: Metabolic: Goal: Ability to maintain appropriate glucose levels will improve Outcome: Progressing   Problem: Nutritional: Goal: Maintenance of adequate nutrition will improve Outcome: Progressing Goal: Progress toward achieving an optimal weight will improve Outcome: Progressing   Problem: Skin Integrity: Goal: Risk for impaired skin integrity will decrease Outcome: Progressing   Problem: Tissue Perfusion: Goal: Adequacy of tissue perfusion will improve Outcome: Progressing   Problem: Education: Goal: Knowledge of General Education information will improve Description: Including pain rating scale, medication(s)/side effects and non-pharmacologic comfort measures Outcome: Progressing   Problem: Health Behavior/Discharge Planning: Goal: Ability to manage health-related needs will improve Outcome:  Progressing   Problem: Clinical Measurements: Goal: Ability to maintain clinical measurements within normal limits will improve Outcome: Progressing Goal: Will remain free from infection Outcome: Progressing Goal: Diagnostic test results will improve Outcome: Progressing Goal: Respiratory complications will improve Outcome: Progressing Goal: Cardiovascular complication will be avoided Outcome: Progressing   Problem: Activity: Goal: Risk for activity intolerance will decrease Outcome: Progressing   Problem: Nutrition: Goal: Adequate nutrition will be maintained Outcome: Progressing   Problem: Coping: Goal: Level of anxiety will decrease Outcome: Progressing   Problem: Elimination: Goal: Will not experience complications related to bowel motility Outcome: Progressing Goal: Will not experience complications related to urinary retention Outcome: Progressing   Problem: Pain Managment: Goal: General experience of comfort will improve and/or be controlled Outcome: Progressing   Problem: Safety: Goal: Ability to remain free from injury will improve Outcome: Progressing   Problem: Skin Integrity: Goal: Risk for impaired skin integrity will decrease Outcome: Progressing   Problem: Education: Goal: Understanding of CV disease, CV risk reduction, and recovery process will improve Outcome: Progressing Goal: Individualized Educational Video(s) Outcome: Progressing   Problem: Activity: Goal: Ability to return to baseline activity level will improve Outcome: Progressing   Problem: Cardiovascular: Goal: Ability to achieve and maintain adequate cardiovascular perfusion will improve Outcome: Progressing   Problem: Cardiovascular: Goal: Vascular access site(s) Level 0-1 will be maintained Outcome: Progressing   Problem: Health Behavior/Discharge Planning: Goal: Ability to safely manage health-related needs after discharge will improve Outcome: Progressing

## 2023-10-09 NOTE — Progress Notes (Signed)
 Hypoglycemic Event  CBG: 69 Time: 1206  Treatment: D50 25 mL (12.5 gm)  Symptoms: None  Follow-up CBG: Time: 1217 CBG Result: 216  Possible Reasons for Event: Inadequate meal intake : pt NPO for cardiac cath; received morning dose insulin.  Comments/MD notified: N/A - hypoglycemia protocol implemented. Hypoglycemia resolved.    Juluis Mire

## 2023-10-09 NOTE — Interval H&P Note (Signed)
 History and Physical Interval Note:  10/09/2023 2:17 PM  Cath Lab Visit (complete for each Cath Lab visit)  Clinical Evaluation Leading to the Procedure:   ACS: No.  Non-ACS:    Anginal Classification: CCS IV  Anti-ischemic medical therapy: Minimal Therapy (1 class of medications)  Non-Invasive Test Results: Equivocal test results - LOW EF  Prior CABG: No previous CABG   Bryan Lemma

## 2023-10-09 NOTE — Progress Notes (Signed)
 Patient A/Ox4 and stable at time of transport to cath lab.

## 2023-10-09 NOTE — Inpatient Diabetes Management (Signed)
 Inpatient Diabetes Program Recommendations  AACE/ADA: New Consensus Statement on Inpatient Glycemic Control   Target Ranges:  Prepandial:   less than 140 mg/dL      Peak postprandial:   less than 180 mg/dL (1-2 hours)      Critically ill patients:  140 - 180 mg/dL    Latest Reference Range & Units 10/09/23 05:58 10/09/23 08:31 10/09/23 12:06 10/09/23 12:17  Glucose-Capillary 70 - 99 mg/dL 914 (H) 782 (H) 69 (L) 216 (H)    Latest Reference Range & Units 10/08/23 08:18 10/08/23 11:26 10/08/23 16:00 10/08/23 22:42  Glucose-Capillary 70 - 99 mg/dL 84 956 (H) 213 (H) 086 (H)   Review of Glycemic Control  Diabetes history: DM2 Outpatient Diabetes medications: Lantus 18-50 units at bedtime, Novolog 8 units TID with meals, Metformin 1000 mg BID Current orders for Inpatient glycemic control: Semglee 4 units at bedtime, Novolog 0-15 units TID with meals  Inpatient Diabetes Program Recommendations:    Insulin: Please consider decreasing Novolog correction to 0-9 units TID with meals.  Thanks, Orlando Penner, RN, MSN, CDCES Diabetes Coordinator Inpatient Diabetes Program [phone] (Team Pager from 8am to 5pm)

## 2023-10-09 NOTE — Consult Note (Signed)
 Advanced Heart Failure Team Consult Note   Primary Physician: System, Provider Not In Cardiologist:  Yvonne Kendall, MD  Reason for Consultation: Acute HF/syncope  HPI:    Mary Velasquez is seen today for evaluation of acute HF/syncope at the request of Dr. Mariah Milling.   Mary Velasquez is a 43 y/o woman with obesity. DM2, brain AVM.   Saw Dr. Azucena Cecil 09/2022 for evaluation of tachycardia. She had ongoing elevated HR for one year prior and positive orthostatics with BP dropping to 80s systolic on standing. 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 Echo 10/2022 showed EF 60-65% without RWMA, mild LVH, G1DD, normal RV systolic function and sive, small pericardial effusion present, with RA pressure 3 mmHg.   In 2/25 had hysterectomy c/b post-op anemia requiring transfusion. Reports several episodes of preceding presyncope that has led to multiple falls but she has not passed out or hit her head until the day she was admitted. 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  When she came to, she was laying on the ground and the resident was yelling her name.  She denies any prodromal symptoms other than persistent dizziness prior to the syncope episode.  No palpitations or CP. She notes in the past, when she had dizziness or syncope, was due to heavy menstrual cycles but she has not had any menstrual cycle since hysterectomy.    + LE edema and mild DOE for several weeks   In ED she has stable vitals. Hstrop 33 & 35 CTA obtained with no evidence of PE, however Multichamber cardiomegaly with reflux of contrast into hepatic veins suggesting right heart dysfunction with patchy mosaic attenuation and groundglass opacities concerning for pulmonary edema.  CTA of the head was obtained with no evidence of acute intracranial abnormality.   Echo EF 30-35% global HK RV ok   Feels good now. Denies CP, SOP, palpitations.   Has  paternal first cousin who dies suddenly in her 30s from cardiac arrest.   Home Medications Prior to Admission medications   Medication Sig Start Date End Date Taking? Authorizing Provider  ferrous sulfate 325 (65 FE) MG EC tablet Take 1 tablet (325 mg total) by mouth 2 (two) times daily. 08/24/23 08/23/24 Yes Schermerhorn, Ihor Austin, MD  gabapentin (NEURONTIN) 300 MG capsule Take 600 mg by mouth at bedtime. 08/13/21  Yes [provider]  insulin aspart (NOVOLOG) 100 UNIT/ML FlexPen Inject 4 Units into the skin 3 (three) times daily with meals. Only take if eating a meal AND Blood Glucose (BG) is 80 or higher. Patient taking differently: Inject 8 Units into the skin 3 (three) times daily with meals. Only take if eating a meal AND Blood Glucose (BG) is 80 or higher. 01/16/23  Yes Darlin Priestly, MD  insulin glargine (LANTUS) 100 UNIT/ML Solostar Pen Inject 14 Units into the skin at bedtime. May substitute as needed per insurance. Patient taking differently: Inject 18-50 Units into the skin at bedtime. May substitute as needed per insurance. 01/16/23 10/06/23 Yes Darlin Priestly, MD  metFORMIN (GLUCOPHAGE) 1000 MG tablet Take 1,000 mg by mouth 2 (two) times daily with a meal. Breakfast & supper   Yes [provider]  nortriptyline (PAMELOR) 50 MG capsule Take 100 mg by mouth at bedtime. 08/13/21  Yes [provider]  Blood Glucose Monitoring Suppl DEVI 1 each by Does not apply route 3 (three) times daily. May dispense any manufacturer covered  by patient's insurance. 01/16/23   Darlin Priestly, MD  Glucose Blood (BLOOD GLUCOSE TEST STRIPS) STRP 1 each by Does not apply route 3 (three) times daily. Use as directed to check blood sugar. May dispense any manufacturer covered by patient's insurance and fits patient's device. 01/16/23   Darlin Priestly, MD  Insulin Pen Needle (PEN NEEDLES) 31G X 5 MM MISC 1 each by Does not apply route 3 (three) times daily. May dispense any manufacturer covered by patient's  insurance. 01/16/23   Darlin Priestly, MD  iron sucrose (VENOFER) Inject 265 mLs (300 mg total) into the vein once for 1 dose. 08/24/23 08/24/23  Schermerhorn, Ihor Austin, MD  Lancet Device MISC 1 each by Does not apply route 3 (three) times daily. May dispense any manufacturer covered by patient's insurance. 01/16/23   Darlin Priestly, MD  Lancets MISC 1 each by Does not apply route 3 (three) times daily. Use as directed to check blood sugar. May dispense any manufacturer covered by patient's insurance and fits patient's device. 01/16/23   Darlin Priestly, MD  norethindrone-ethinyl estradiol 1/35 (ORTHO-NOVUM) tablet Take 1 pill 3 times per day for 3 days, then 1 pill 2 times per day for 2 days, then 1 pill daily until you see your outpatient gynecologist. 01/16/23   Darlin Priestly, MD    Past Medical History: Past Medical History:  Diagnosis Date   Anemia    AVM (arteriovenous malformation)    Diabetes mellitus without complication (HCC)    History of kidney stones    Peripheral neuropathy    Pneumonia    Tachycardia     Past Surgical History: Past Surgical History:  Procedure Laterality Date   ABDOMINAL SURGERY     BREAST SURGERY     CESAREAN SECTION     x 2   CYSTOSCOPY N/A 08/21/2023   Procedure: CYSTOSCOPY;  Surgeon: Schermerhorn, Ihor Austin, MD;  Location: [facility] ORS;  Service: Gynecology;  Laterality: N/A;   ESOPHAGOGASTRODUODENOSCOPY N/A 07/10/2018   Procedure: ESOPHAGOGASTRODUODENOSCOPY (EGD);  Surgeon: Toney Reil, MD;  Location: [facility];  Service: Gastroenterology;  Laterality: N/A;   ESOPHAGOGASTRODUODENOSCOPY     HYSTERECTOMY ABDOMINAL WITH SALPINGECTOMY Bilateral 08/21/2023   Procedure: HYSTERECTOMY ABDOMINAL WITH SALPINGECTOMY;  Surgeon: Schermerhorn, Ihor Austin, MD;  Location: [facility] ORS;  Service: Gynecology;  Laterality: Bilateral;   TUBAL LIGATION      Family History: Family History  Problem Relation Age of Onset   Heart disease Paternal Aunt    Heart attack Maternal Grandfather     Heart disease Maternal Grandfather    Heart attack Cousin    Heart disease Cousin    CAD Neg Hx    Seizures Neg Hx     Social History: Social History   Socioeconomic History   Marital status: Single    Spouse name: Not on file   Number of children: 2   Years of education: Not on file   Highest education level: Some college, no degree  Occupational History   Not on file  Tobacco Use   Smoking status: Never   Smokeless tobacco: Never  Vaping Use   Vaping status: Never Used  Substance and Sexual Activity   Alcohol use: No   Drug use: Not Currently   Sexual activity: Not on file  Other Topics Concern   Not on file  Social History Narrative   Lives alone   Social Drivers of Health   Financial Resource Strain: High Risk (10/09/2023)   Overall Financial Resource Strain (CARDIA)  Difficulty of Paying Living Expenses: Hard  Food Insecurity: No Food Insecurity (10/09/2023)   Hunger Vital Sign    Worried About Running Out of Food in the Last Year: Never true    Ran Out of Food in the Last Year: Never true  Transportation Needs: No Transportation Needs (10/09/2023)   PRAPARE - Administrator, Civil Service (Medical): No    Lack of Transportation (Non-Medical): No  Physical Activity: Not on file  Stress: Not on file  Social Connections: Not on file    Allergies:  Allergies  Allergen Reactions   Oxycodone Anaphylaxis and Swelling    Throat swelling   Oxycontin [Oxycodone Hcl] Anaphylaxis and Swelling    Throat swelling     Objective:    Vital Signs:   Temp:  [98 F (36.7 C)-99.2 F (37.3 C)] 98 F (36.7 C) (04/10 0817) Pulse Rate:  [96-112] 100 (04/10 0817) Resp:  [17-19] 19 (04/10 0610) BP: (94-164)/(67-108) 96/67 (04/10 0817) SpO2:  [89 %-100 %] 95 % (04/10 0817) Weight:  [78.3 kg] 78.3 kg (04/10 0500) Last BM Date : 10/08/23  Weight change: Filed Weights   10/06/23 0854 10/09/23 0500  Weight: 72.6 kg 78.3 kg     Intake/Output:   Intake/Output Summary (Last 24 hours) at 10/09/2023 1222 Last data filed at 10/09/2023 1000 Gross per 24 hour  Intake 363 ml  Output --  Net 363 ml      Physical Exam    General:  Well appearing. No resp difficulty HEENT: normal Neck: supple. JVP flat. Carotids 2+ bilat; no bruits. No lymphadenopathy or thyromegaly appreciated. Cor: PMI nondisplaced. Regular tachy  Lungs: clear Abdomen: obese soft, nontender, nondistended. No hepatosplenomegaly. No bruits or masses. Good bowel sounds. Extremities: no cyanosis, clubbing, rash, edema Neuro: alert & orientedx3, cranial nerves grossly intact. moves all 4 extremities w/o difficulty. Affect pleasant   Telemetry   Sinus ~100 Personally reviewed  EKG    Sinus tach 109 mild slurring of initial qrs but no clear pre-excitation. QTc Personally reviewed   Labs   Basic Metabolic Panel: Recent Labs  Lab 10/06/23 1013 10/07/23 0547 10/08/23 0507 10/09/23 0919  NA 133* 139 137 137  K 4.4 3.4* 3.6 3.5  CL 99 105 102 99  CO2 26 27 30 29   GLUCOSE 162* 60* 116* 157*  BUN 15 14 12 14   CREATININE 1.12* 0.85 0.72 0.74  CALCIUM 8.1* 8.1* 7.9* 7.7*    Liver Function Tests: No results for input(s): "AST", "ALT", "ALKPHOS", "BILITOT", "PROT", "ALBUMIN" in the last 168 hours. No results for input(s): "LIPASE", "AMYLASE" in the last 168 hours. No results for input(s): "AMMONIA" in the last 168 hours.  CBC: Recent Labs  Lab 10/06/23 1013 10/07/23 0547  WBC 9.0 6.8  NEUTROABS 7.4 4.6  HGB 10.6* 9.9*  HCT 33.1* 30.2*  MCV 86.6 84.6  PLT 299 296    Cardiac Enzymes: No results for input(s): "CKTOTAL", "CKMB", "CKMBINDEX", "TROPONINI" in the last 168 hours.  BNP: BNP (last 3 results) Recent Labs    10/06/23 1013  BNP 235.0*    ProBNP (last 3 results) No results for input(s): "PROBNP" in the last 8760 hours.   CBG: Recent Labs  Lab 10/08/23 2242 10/09/23 0558 10/09/23 0831 10/09/23 1206  10/09/23 1217  GLUCAP 207* 159* 155* 69* 216*    Coagulation Studies: No results for input(s): "LABPROT", "INR" in the last 72 hours.   Imaging   No results found.   Medications:  Current Medications:  [START ON 10/10/2023] aspirin  81 mg Oral Pre-Cath   enoxaparin (LOVENOX) injection  40 mg Subcutaneous Q24H   ferrous sulfate  325 mg Oral Q breakfast   furosemide  40 mg Intravenous Daily   gabapentin  600 mg Oral QHS   insulin aspart  0-15 Units Subcutaneous TID WC   insulin glargine-yfgn  4 Units Subcutaneous QHS   losartan  12.5 mg Oral Daily   metoprolol succinate  25 mg Oral Daily   nortriptyline  100 mg Oral QHS   sodium chloride flush  3 mL Intravenous Q12H    Infusions:  [START ON 10/10/2023] sodium chloride       Assessment/Plan   1. Syncope - high suspicion for arrhythmogenic cause in setting of reduced EF, sudden onset and family h/o SCD - R/L cath today - keep on tele - Will need cMRI - Likely need EP to see. Consider ILR and Lifvest  2. Acute systolic HF - echo EF 30-35% - volume status ok - suspect NICM - R/L cath today - cMRI tomorrow - Titrate GDMT - Will need Genetic eval   Length of Stay: 2  Arvilla Meres, MD  10/09/2023, 12:22 PM  Advanced Heart Failure Team Pager [phone] (M-F; 7a - 5p)  Please contact [facility] for night-coverage after hours (4p -7a ) and weekends on [email or web address]

## 2023-10-09 NOTE — Progress Notes (Signed)
 Progress Note   Patient: Mary Velasquez WUJ:[id number] DOB: [birth year] DOA: 10/06/2023     2 DOS: the patient was seen and examined on 10/09/2023   Brief hospital course:  Mary Velasquez is a 43 y.o. female with medical history significant of type 2 diabetes, recent hysterectomy complicated by post-op anemia requiring blood transfusion, inappropriate sinus tachycardia, brain AVM, peripheral neuropathy, who presents to the ED due to dizziness and syncope.    Ms. 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  When she came to, she was laying on the ground and the resident was yelling her name.  She denies any prodromal symptoms other than persistent dizziness prior to the syncope episode.  She notes in the past, when she had dizziness or syncope, was due to heavy menstrual cycles but she has not had any menstrual cycle since hysterectomy.  She endorses chronic lower extremity swelling that is unchanged and new abdominal swelling that has been occurring for the last few months.  She denies any orthopnea, shortness of breath, chest pain, palpitations.  She denies any recent illness.   ED Course:  On arrival to the ED, patient was normotensive at 127/91 with heart rate of 108. She was saturating at 93% on room air. She was afebrile at 98. Initial work up notable for hemoglobin of 10.6, sodium 133, creatinine 112, glucose 162 GFR above 60.  Troponin 33 none 35.  D-dimer elevated 0.84.  CTA obtained with no evidence of PE, however Multichamber cardiomegaly with reflux of contrast into hepatic veins suggesting right heart dysfunction with patchy mosaic attenuation and groundglass opacities concerning for pulmonary edema.  CTA of the head was obtained with no evidence of acute  intracranial abnormality.  Patient started on aspirin, IV fluids, meclizine and Zofran.  TR[facility] contacted for admission.     Assessment and Plan:   * Syncope Likely secondary to arrhythmia in the setting of cardiomyopathy Patient presented for evaluation of a 1 day history of dizziness with multiple falls and an episode of syncope today at work.  2D echocardiogram shows an LVEF of 30 to 35% with moderately decreased LV systolic function and global hypokinesis.  Right ventricular systolic function is mildly reduced with moderately elevated pulmonary artery systolic pressure Appreciate cardiology input.  Patient is scheduled for right and left heart cath today     Elevated troponin On presentation, patient's troponins were noted to be mildly elevated indicating demand ischemia.   CTA was obtained to rule out PE and notable for concern of right heart dysfunction, findings suggestive of pulmonary hypertension and pulmonary edema.  Appreciate cardiology input, 2D echocardiogram shows a reduced LVEF of 30 to 35% with LV global hypokinesis. Patient is scheduled for left and right heart catheter today and would also benefit from a cardiac MRI     Acute systolic CHF New onset cardiomyopathy Patient presented to the ER for evaluation after a witnessed syncopal episode at work CT angiogram showed multi chamber cardiomegaly with reflux of contrast material into the hepatic veins, suggesting a degree of right heart dysfunction. Patchy mosaic attenuation and diffuse peri bronchovascular ground-glass opacities, which may be seen in the setting of pulmonary edema. Small bilateral pleural effusions. Dilated main pulmonary artery, which can be seen  in the setting of pulmonary hypertension. 2D echocardiogram showed an LVEF of 30 to 35% with moderately decreased LV function and LV global hypokinesis. Continue Lasix 40 mg IV daily Continue metoprolol and losartan Patient will need a LifeVest on discharge        Hypoxia Patient was noted to have room air pulse oximetry post ambulation of 85% and is currently on 2 L of oxygen to maintain pulse oximetry greater than 92%. Hypoxia appears to be secondary to acute systolic CHF Will attempt to wean patient off oxygen once acute illness improves or resolves.         History of inappropriate sinus tachycardia Continue metoprolol     Normocytic anemia History of chronic anemia, initially due to chronic blood loss and then postoperative blood loss.   Hemoglobin appears stable at this time        DM (diabetes mellitus), type 2 with neurological complications (HCC) Continue Semglee 4 units at bedtime with sliding scale coverage Maintain consistent carbohydrate diet       Obesity,Class 1 Complicates overall prognosis and care. Lifestyle modification exercise has been discussed with patient in detail.         Subjective: No new complaints.  Scheduled for left heart cath today  Physical Exam: Vitals:   10/09/23 0610 10/09/23 0817 10/09/23 1242 10/09/23 1511  BP: 94/70 96/67 116/84   Pulse: 96 100 (!) 101   Resp: 19  20   Temp: 98 F (36.7 C) 98 F (36.7 C) 98.2 F (36.8 C)   TempSrc: Oral Oral Oral   SpO2: 96% 95% 96% (!) 88%  Weight:      Height:        Constitutional:      General: She is not in acute distress.    Appearance: She is obese. She is not toxic-appearing.  HENT:     Head: Normocephalic and atraumatic.     Mouth/Throat:     Mouth: Mucous membranes are moist.     Pharynx: Oropharynx is clear.  Eyes:     Extraocular Movements: Extraocular movements intact.     Conjunctiva/sclera: Conjunctivae normal.  Cardiovascular:     Rate and Rhythm: Regular rate and rhythm    Heart sounds: No murmur heard.    No gallop.  Pulmonary:     Effort: Pulmonary effort is normal. No respiratory distress.     Breath sounds: Normal breath sounds. No wheezing, rhonchi or rales.  Abdominal:     General: Bowel sounds are normal.   Central adiposity    Palpations: Abdomen is soft.     Tenderness: There is no abdominal tenderness. There is no guarding.  Musculoskeletal:     Right lower leg: No edema.     Left lower leg: No edema.  Skin:    General: Skin is warm and dry.  Neurological:     Mental Status: She is alert.     Comments:  Psychiatric:        Mood and Affect: Mood normal.        Behavior: Behavior normal.       Data Reviewed: BUN 14, creatinine 0.74 Labs reviewed  Family Communication: Plan of care discussed with patient at the bedside.  She verbalizes understanding and agrees with the plan  Disposition: Status is: Inpatient Remains inpatient appropriate because: Further evaluation of new onset cardiomyopathy  Planned Discharge Destination: Home    Time spent: 40 minutes  Author: Lucile Shutters, MD 10/09/2023 3:14 PM  For on call  review [email or web address].

## 2023-10-10 ENCOUNTER — Telehealth (HOSPITAL_COMMUNITY): Payer: Self-pay | Admitting: Pharmacy Technician

## 2023-10-10 ENCOUNTER — Telehealth: Payer: Self-pay | Admitting: Cardiology

## 2023-10-10 ENCOUNTER — Encounter: Payer: Self-pay | Admitting: Cardiology

## 2023-10-10 ENCOUNTER — Other Ambulatory Visit (HOSPITAL_COMMUNITY): Payer: Self-pay

## 2023-10-10 ENCOUNTER — Inpatient Hospital Stay

## 2023-10-10 DIAGNOSIS — I428 Other cardiomyopathies: Secondary | ICD-10-CM | POA: Diagnosis not present

## 2023-10-10 DIAGNOSIS — I420 Dilated cardiomyopathy: Secondary | ICD-10-CM

## 2023-10-10 DIAGNOSIS — I5020 Unspecified systolic (congestive) heart failure: Secondary | ICD-10-CM

## 2023-10-10 DIAGNOSIS — R55 Syncope and collapse: Secondary | ICD-10-CM | POA: Diagnosis not present

## 2023-10-10 DIAGNOSIS — I5021 Acute systolic (congestive) heart failure: Secondary | ICD-10-CM | POA: Diagnosis not present

## 2023-10-10 LAB — BASIC METABOLIC PANEL WITH GFR
Anion gap: 11 (ref 5–15)
BUN: 15 mg/dL (ref 6–20)
CO2: 33 mmol/L — ABNORMAL HIGH (ref 22–32)
Calcium: 8 mg/dL — ABNORMAL LOW (ref 8.9–10.3)
Chloride: 94 mmol/L — ABNORMAL LOW (ref 98–111)
Creatinine, Ser: 0.89 mg/dL (ref 0.44–1.00)
GFR, Estimated: >60 mL/min (ref >60)
Glucose, Bld: 106 mg/dL — ABNORMAL HIGH (ref 70–99)
Potassium: 2.8 mmol/L — ABNORMAL LOW (ref 3.5–5.1)
Sodium: 138 mmol/L (ref 135–145)

## 2023-10-10 LAB — POCT I-STAT EG7
Acid-Base Excess: 8 mmol/L — ABNORMAL HIGH (ref 0.0–2.0)
Bicarbonate: 34 mmol/L — ABNORMAL HIGH (ref 20.0–28.0)
Calcium, Ion: 1.07 mmol/L — ABNORMAL LOW (ref 1.15–1.40)
HCT: 32 % — ABNORMAL LOW (ref 36.0–46.0)
Hemoglobin: 10.9 g/dL — ABNORMAL LOW (ref 12.0–15.0)
O2 Saturation: 58 %
Potassium: 3.4 mmol/L — ABNORMAL LOW (ref 3.5–5.1)
Sodium: 142 mmol/L (ref 135–145)
TCO2: 36 mmol/L — ABNORMAL HIGH (ref 22–32)
pCO2, Ven: 52.8 mmHg (ref 44–60)
pH, Ven: 7.417 (ref 7.25–7.43)
pO2, Ven: 30 mmHg — CL (ref 32–45)

## 2023-10-10 LAB — GLUCOSE, CAPILLARY
Glucose-Capillary: 30 mg/dL — CL (ref 70–99)
Glucose-Capillary: 60 mg/dL — ABNORMAL LOW (ref 70–99)
Glucose-Capillary: 146 mg/dL — ABNORMAL HIGH (ref 70–99)
Glucose-Capillary: 94 mg/dL (ref 70–99)
Glucose-Capillary: 161 mg/dL — ABNORMAL HIGH (ref 70–99)
Glucose-Capillary: 275 mg/dL — ABNORMAL HIGH (ref 70–99)
Glucose-Capillary: 360 mg/dL — ABNORMAL HIGH (ref 70–99)
Glucose-Capillary: 207 mg/dL — ABNORMAL HIGH (ref 70–99)

## 2023-10-10 LAB — HEMOGLOBIN A1C
Hgb A1c MFr Bld: 10.4 % — ABNORMAL HIGH (ref 4.8–5.6)
Mean Plasma Glucose: 252 mg/dL

## 2023-10-10 LAB — MAGNESIUM: Magnesium: 1.2 mg/dL — ABNORMAL LOW (ref 1.7–2.4)

## 2023-10-10 MED ORDER — MAGNESIUM SULFATE 2 GM/50ML IV SOLN
2.0000 g | Freq: Once | INTRAVENOUS | Status: AC
  Administered 2023-10-10: 2 g via INTRAVENOUS
  Filled 2023-10-10: qty 50

## 2023-10-10 MED ORDER — POTASSIUM CHLORIDE CRYS ER 20 MEQ PO TBCR
40.0000 meq | EXTENDED_RELEASE_TABLET | Freq: Once | ORAL | Status: AC
  Administered 2023-10-10: 40 meq via ORAL
  Filled 2023-10-10: qty 2

## 2023-10-10 MED ORDER — POTASSIUM CHLORIDE CRYS ER 20 MEQ PO TBCR
40.0000 meq | EXTENDED_RELEASE_TABLET | ORAL | Status: AC
Start: 2023-10-10 — End: 2023-10-10
  Administered 2023-10-10: 40 meq via ORAL
  Filled 2023-10-10: qty 2

## 2023-10-10 MED ORDER — SPIRONOLACTONE 12.5 MG HALF TABLET
12.5000 mg | ORAL_TABLET | Freq: Every day | ORAL | Status: DC
  Administered 2023-10-10 – 2023-10-12 (×3): 12.5 mg via ORAL
  Filled 2023-10-10 (×4): qty 1

## 2023-10-10 MED ORDER — POTASSIUM CHLORIDE CRYS ER 20 MEQ PO TBCR: 40.0000 meq | EXTENDED_RELEASE_TABLET | Freq: Once | ORAL | Status: DC

## 2023-10-10 MED ORDER — INSULIN ASPART 100 UNIT/ML IJ SOLN
0.0000 [IU] | Freq: Three times a day (TID) | INTRAMUSCULAR | Status: DC
  Administered 2023-10-10: 9 [IU] via SUBCUTANEOUS
  Administered 2023-10-10: 5 [IU] via SUBCUTANEOUS
  Administered 2023-10-11: 1 [IU] via SUBCUTANEOUS
  Administered 2023-10-11: 2 [IU] via SUBCUTANEOUS
  Administered 2023-10-11 – 2023-10-12 (×2): 5 [IU] via SUBCUTANEOUS
  Administered 2023-10-12: 2 [IU] via SUBCUTANEOUS
  Filled 2023-10-10 (×7): qty 1

## 2023-10-10 MED ORDER — FUROSEMIDE 10 MG/ML IJ SOLN: 40.0000 mg | Freq: Once | INTRAMUSCULAR | Status: DC

## 2023-10-10 MED ORDER — INSULIN ASPART 100 UNIT/ML IJ SOLN: 0.0000 [IU] | Freq: Three times a day (TID) | INTRAMUSCULAR | Status: DC

## 2023-10-10 MED ORDER — MAGNESIUM SULFATE 4 GM/100ML IV SOLN
4.0000 g | Freq: Once | INTRAVENOUS | Status: AC
  Administered 2023-10-10: 4 g via INTRAVENOUS
  Filled 2023-10-10: qty 100

## 2023-10-10 MED ORDER — MAGNESIUM SULFATE 50 % IJ SOLN: 6.0000 g | Freq: Once | INTRAVENOUS | Status: DC

## 2023-10-10 MED ORDER — MIDODRINE HCL 5 MG PO TABS: 10.0000 mg | ORAL_TABLET | Freq: Three times a day (TID) | ORAL | Status: DC

## 2023-10-10 MED ORDER — GADOBUTROL 1 MMOL/ML IV SOLN
10.0000 mL | Freq: Once | INTRAVENOUS | Status: AC | PRN
  Administered 2023-10-10: 10 mL via INTRAVENOUS

## 2023-10-10 MED ORDER — ALBUMIN HUMAN 25 % IV SOLN
25.0000 g | Freq: Once | INTRAVENOUS | Status: AC
  Administered 2023-10-10: 25 g via INTRAVENOUS
  Filled 2023-10-10: qty 100

## 2023-10-10 MED ORDER — MIDODRINE HCL 5 MG PO TABS
10.0000 mg | ORAL_TABLET | Freq: Once | ORAL | Status: AC
  Administered 2023-10-10: 10 mg via ORAL
  Filled 2023-10-10: qty 2

## 2023-10-10 NOTE — Consult Note (Addendum)
 ELECTROPHYSIOLOGY CONSULT NOTE    Patient ID: Mary Velasquez MRN: [id number], DOB/AGE: [birth year] 43 y.o.  Admit date: 10/06/2023 Date of Consult: 10/10/2023  Primary Physician: System, Provider Not In Primary Cardiologist: Yvonne Kendall, MD  Electrophysiologist: none   Referring Provider: @ATTENDING @  Patient Profile: Mary Velasquez is a 43 y.o. female with a history of inappropriate sinus tach, orthostatic hypotension, T2DM, hysterectomy c/b post-op anemia requiring transfusions (08/2023), brain AVM who is being seen today for the evaluation of syncope with newly reduced LVEF at the request of Dr. Gala Romney.  HPI:  Mary Velasquez is a 43 y.o. female with PMH as above. She saw Dr. Azucena Cecil 09/2022 for evaluation of elevated blood pressure and tachycardia. She had ongoing elevated HR for one year prior and positive orthostatics with BP dropping to 80s systolic on standing. 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 Echo 10/2022 showed EF 60-65% without RWMA, mild LVH, G1DD, normal RV systolic function and sive, small pericardial effusion present, with RA pressure 3 mmHg.   She had unwitnessed episode 4/6 with LH and dizziness with several falls, with ?syncope. She had a second syncopal episode 4/7 at work where she felt LH. She has history of similar episodes in the past prior to her hysterectomy associated with menorrhagia requiring blood transfusions. No nausea, palpitations, chest pain, chest pressure with episodes.   In ER, labs overall normal, BNP 235, trop flat. D-dimer slightly elevated with Chest CTA negative for PE. CT chest did show multichamber cardiomegaly with reflux of contrast material into the hepatic veins, suggesting a degree of right heart dysfunction and findings suggestive of pulmonary edema. Head CT negative. MRI of the brain negative for acute intracranial abnormality.  TTE showed newly reduced LVEF to 30-35% with global HK. R/L cath with  normal cors, mild-mod PH with severely elevated LV pressure and was started on IV lasix.  HF team has ordered cardiac MRI to further eval, planned for this afternoon.   On interview, she feels well at this time. Her main request is to take a shower. Good appetite, no SOB or edema.    Family history -  She had a cousin with "heart issues" who died at 63y, had heart failure. No defibrillators in family, no deaths as young children, no drowning.      Labs Potassium2.8* (04/11 0431) Magnesium  1.2* (04/11 0431) Creatinine, ser  0.89 (04/11 0431) PLT  333 (04/10 1840) HGB  11.1* (04/10 1840) WBC 6.9 (04/10 1840)  .    Past Medical History:  Diagnosis Date   Anemia    AVM (arteriovenous malformation)    Diabetes mellitus without complication (HCC)    History of kidney stones    Peripheral neuropathy    Pneumonia    Tachycardia      Surgical History:  Past Surgical History:  Procedure Laterality Date   ABDOMINAL SURGERY     BREAST SURGERY     CESAREAN SECTION     x 2   CYSTOSCOPY N/A 08/21/2023   Procedure: CYSTOSCOPY;  Surgeon: Schermerhorn, Ihor Austin, MD;  Location: [facility] ORS;  Service: Gynecology;  Laterality: N/A;   ESOPHAGOGASTRODUODENOSCOPY N/A 07/10/2018   Procedure: ESOPHAGOGASTRODUODENOSCOPY (EGD);  Surgeon: Toney Reil, MD;  Location: [facility] ENDOSCOPY;  Service: Gastroenterology;  Laterality: N/A;   ESOPHAGOGASTRODUODENOSCOPY     HYSTERECTOMY ABDOMINAL WITH SALPINGECTOMY Bilateral 08/21/2023   Procedure: HYSTERECTOMY ABDOMINAL WITH SALPINGECTOMY;  Surgeon: Schermerhorn, Ihor Austin, MD;  Location: [facility] ORS;  Service: Gynecology;  Laterality:  Bilateral;   RIGHT/LEFT HEART CATH AND CORONARY ANGIOGRAPHY N/A 10/09/2023   Procedure: RIGHT/LEFT HEART CATH AND CORONARY ANGIOGRAPHY;  Surgeon: Marykay Lex, MD;  Location: [facility] INVASIVE CV LAB;  Service: Cardiovascular;  Laterality: N/A;   TUBAL LIGATION       Medications Prior to Admission  Medication Sig Dispense  Refill Last Dose/Taking   ferrous sulfate 325 (65 FE) MG EC tablet Take 1 tablet (325 mg total) by mouth 2 (two) times daily. 60 tablet 3 10/05/2023   gabapentin (NEURONTIN) 300 MG capsule Take 600 mg by mouth at bedtime.   10/05/2023   insulin aspart (NOVOLOG) 100 UNIT/ML FlexPen Inject 4 Units into the skin 3 (three) times daily with meals. Only take if eating a meal AND Blood Glucose (BG) is 80 or higher. (Patient taking differently: Inject 8 Units into the skin 3 (three) times daily with meals. Only take if eating a meal AND Blood Glucose (BG) is 80 or higher.) 3 mL 2 10/05/2023   insulin glargine (LANTUS) 100 UNIT/ML Solostar Pen Inject 14 Units into the skin at bedtime. May substitute as needed per insurance. (Patient taking differently: Inject 18-50 Units into the skin at bedtime. May substitute as needed per insurance.) 4.2 mL 2 10/05/2023   metFORMIN (GLUCOPHAGE) 1000 MG tablet Take 1,000 mg by mouth 2 (two) times daily with a meal. Breakfast & supper   10/05/2023   nortriptyline (PAMELOR) 50 MG capsule Take 100 mg by mouth at bedtime.   10/05/2023   Blood Glucose Monitoring Suppl DEVI 1 each by Does not apply route 3 (three) times daily. May dispense any manufacturer covered by patient's insurance. 1 each 0    Glucose Blood (BLOOD GLUCOSE TEST STRIPS) STRP 1 each by Does not apply route 3 (three) times daily. Use as directed to check blood sugar. May dispense any manufacturer covered by patient's insurance and fits patient's device. 100 strip 0    Insulin Pen Needle (PEN NEEDLES) 31G X 5 MM MISC 1 each by Does not apply route 3 (three) times daily. May dispense any manufacturer covered by patient's insurance. 100 each 0    iron sucrose (VENOFER) Inject 265 mLs (300 mg total) into the vein once for 1 dose. 265 mL 0    Lancet Device MISC 1 each by Does not apply route 3 (three) times daily. May dispense any manufacturer covered by patient's insurance. 1 each 0    Lancets MISC 1 each by Does not apply route 3  (three) times daily. Use as directed to check blood sugar. May dispense any manufacturer covered by patient's insurance and fits patient's device. 100 each 0    norethindrone-ethinyl estradiol 1/35 (ORTHO-NOVUM) tablet Take 1 pill 3 times per day for 3 days, then 1 pill 2 times per day for 2 days, then 1 pill daily until you see your outpatient gynecologist. 30 tablet 2     Inpatient Medications:   enoxaparin (LOVENOX) injection  40 mg Subcutaneous Q24H   ferrous sulfate  325 mg Oral Q breakfast   gabapentin  600 mg Oral QHS   insulin aspart  0-9 Units Subcutaneous TID WC   insulin glargine-yfgn  4 Units Subcutaneous QHS   losartan  12.5 mg Oral Daily   metoprolol succinate  25 mg Oral Daily   nortriptyline  100 mg Oral QHS   potassium chloride  40 mEq Oral Q4H   sodium chloride flush  3 mL Intravenous Q12H   sodium chloride flush  3 mL Intravenous Q12H  spironolactone  12.5 mg Oral Daily    Allergies:  Allergies  Allergen Reactions   Oxycodone Anaphylaxis and Swelling    Throat swelling   Oxycontin [Oxycodone Hcl] Anaphylaxis and Swelling    Throat swelling     Family History  Problem Relation Age of Onset   Heart disease Paternal Aunt    Heart attack Maternal Grandfather    Heart disease Maternal Grandfather    Heart attack Cousin    Heart disease Cousin    CAD Neg Hx    Seizures Neg Hx      Physical Exam: Vitals:   10/10/23 0637 10/10/23 0747 10/10/23 0900 10/10/23 1138  BP: 100/68 109/79 120/84 (!) 130/93  Pulse: 91 99  (!) 110  Resp:  17    Temp:  97.6 F (36.4 C)  98 F (36.7 C)  TempSrc:    Oral  SpO2: 98% 94%  97%  Weight:      Height:        GEN- NAD, A&O x 3, normal affect HEENT: Normocephalic, atraumatic Lungs- diminished in bases, otherwise CTAB, Normal effort.  Heart- Regular, tachycardic rate and rhythm, No M/G/R.  GI- Soft, NT, ND.  Extremities- No clubbing, cyanosis, or edema   Radiology/Studies: CARDIAC CATHETERIZATION Result Date:  10/09/2023   LV end diastolic pressure is severely elevated.   Hemodynamic findings consistent with mild pulmonary hypertension.   There is no aortic valve stenosis.   Anticipated discharge date to be determined.   Patient will be started on 40 mg IV twice daily Lasix.   Cardiac MRI ordered per advanced heart failure team-Dr. Gala Romney.   No indication for antiplatelet therapy at this time . 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 LV P-EDP 136/2-36 mmHg; AoP-MAP 134/88-108 mmHg. AO sat 88%, PA sat 57% on room air. Fick cardiac output-index 5.08-2.89. PVR 1 89D/S; index through 3 32D/SI  PLAN OF CARE:  Patient will return to nursing unit after TR band removal.  Will order 40 mg IV Lasix twice daily and cardiac MRI.   Patient will be started on 40 mg IV twice daily Lasix.   Cardiac MRI ordered per advanced heart failure team-Dr. Gala Romney.   No indication for antiplatelet therapy at this time . Bryan Lemma, MD  ECHOCARDIOGRAM COMPLETE Result Date: 10/07/2023    ECHOCARDIOGRAM REPORT   Patient Name:   Mary Velasquez Date of Exam: 10/07/2023 Medical Rec #:  [id number]      Height:       60.0 in Accession #:    [id number]     Weight:       160.0 lb Date of Birth:  06-Dec-1980      BSA:          1.698 m Patient Age:    42 years       BP:           107/76 mmHg Patient Gender: F              HR:           113 bpm. Exam Location:  [facility] Procedure: 2D Echo, 3D Echo, Cardiac Doppler, Color Doppler and Strain Analysis            (Both Spectral and Color Flow Doppler were utilized during  procedure). Indications:     Syncope  History:         Patient has prior history of Echocardiogram examinations, most                  recent 11/21/2022. Signs/Symptoms:Syncope and Dyspnea; Risk                   Factors:Diabetes.  Sonographer:     Mikki Harbor Referring Phys:  [id number] Verdene Lennert Diagnosing Phys: Yvonne Kendall MD  Sonographer Comments: Image acquisition challenging due to respiratory motion. Global longitudinal strain was attempted. IMPRESSIONS  1. Left ventricular ejection fraction, by estimation, is 30 to 35%. The left ventricle has moderately decreased function. The left ventricle demonstrates global hypokinesis. There is mild left ventricular hypertrophy. Indeterminate diastolic filling due  to E-A fusion. The average left ventricular global longitudinal strain is -6.6 %. The global longitudinal strain is abnormal.  2. Right ventricular systolic function is mildly reduced. The right ventricular size is normal. There is moderately elevated pulmonary artery systolic pressure.  3. The mitral valve is normal in structure. Moderate mitral valve regurgitation. No evidence of mitral stenosis.  4. Tricuspid valve regurgitation is mild to moderate.  5. The aortic valve is tricuspid. Aortic valve regurgitation is mild.  6. The inferior vena cava is dilated in size with <50% respiratory variability, suggesting right atrial pressure of 15 mmHg. Comparison(s): A prior study was performed on 11/21/2022. The left ventricular function is significantly worse. FINDINGS  Left Ventricle: Left ventricular ejection fraction, by estimation, is 30 to 35%. The left ventricle has moderately decreased function. The left ventricle demonstrates global hypokinesis. The average left ventricular global longitudinal strain is -6.6 %.  Strain was performed and the global longitudinal strain is abnormal. 3D ejection fraction reviewed and evaluated as part of the interpretation. Alternate measurement of EF is felt to be most reflective of LV function. The left ventricular internal cavity size was normal in size. There is mild left ventricular hypertrophy. Indeterminate diastolic filling due to E-A fusion. Right Ventricle: The  right ventricular size is normal. No increase in right ventricular wall thickness. Right ventricular systolic function is mildly reduced. There is moderately elevated pulmonary artery systolic pressure. The tricuspid regurgitant velocity is 2.98 m/s, and with an assumed right atrial pressure of 15 mmHg, the estimated right ventricular systolic pressure is 50.5 mmHg. Left Atrium: Left atrial size was normal in size. Right Atrium: Right atrial size was normal in size. Pericardium: Trivial pericardial effusion is present. Mitral Valve: The mitral valve is normal in structure. Moderate mitral valve regurgitation. No evidence of mitral valve stenosis. MV peak gradient, 6.2 mmHg. The mean mitral valve gradient is 3.0 mmHg. Tricuspid Valve: The tricuspid valve is normal in structure. Tricuspid valve regurgitation is mild to moderate. Aortic Valve: The aortic valve is tricuspid. Aortic valve regurgitation is mild. Aortic valve mean gradient measures 3.0 mmHg. Aortic valve peak gradient measures 6.1 mmHg. Aortic valve area, by VTI measures 2.28 cm. Pulmonic Valve: The pulmonic valve was not well visualized. Pulmonic valve regurgitation is mild. No evidence of pulmonic stenosis. Aorta: The aortic root is normal in size and structure. Pulmonary Artery: The pulmonary artery is of normal size. Venous: The inferior vena cava is dilated in size with less than 50% respiratory variability, suggesting right atrial pressure of 15 mmHg. IAS/Shunts: No atrial level shunt detected by color flow Doppler.  LEFT VENTRICLE PLAX 2D LVIDd:         4.80 cm LVIDs:  3.50 cm     2D Longitudinal Strain LV PW:         1.40 cm     2D Strain GLS Avg:     -6.6 % LV IVS:        1.20 cm LVOT diam:     1.90 cm LV SV:         49 LV SV Index:   29 LVOT Area:     2.84 cm  LV Volumes (MOD) LV vol d, MOD A2C: 60.9 ml LV vol d, MOD A4C: 57.2 ml LV vol s, MOD A2C: 38.6 ml LV vol s, MOD A4C: 36.7 ml LV SV MOD A2C:     22.3 ml LV SV MOD A4C:     57.2 ml  LV SV MOD BP:      21.9 ml RIGHT VENTRICLE RV Basal diam:  3.30 cm RV Mid diam:    2.30 cm RV S prime:     8.45 cm/s TAPSE (M-mode): 1.6 cm LEFT ATRIUM             Index        RIGHT ATRIUM           Index LA diam:        4.10 cm 2.41 cm/m   RA Area:     10.00 cm LA Vol (A2C):   43.5 ml 25.62 ml/m  RA Volume:   16.70 ml  9.84 ml/m LA Vol (A4C):   47.3 ml 27.86 ml/m LA Biplane Vol: 48.6 ml 28.63 ml/m  AORTIC VALVE                    PULMONIC VALVE AV Area (Vmax):    2.20 cm     PV Vmax:          1.19 m/s AV Area (Vmean):   1.83 cm     PV Peak grad:     5.7 mmHg AV Area (VTI):     2.28 cm     PR End Diast Vel: 15.21 msec AV Vmax:           123.50 cm/s AV Vmean:          79.950 cm/s AV VTI:            0.214 m AV Peak Grad:      6.1 mmHg AV Mean Grad:      3.0 mmHg LVOT Vmax:         95.80 cm/s LVOT Vmean:        51.600 cm/s LVOT VTI:          0.172 m LVOT/AV VTI ratio: 0.80  AORTA Ao Root diam: 2.70 cm MITRAL VALVE                TRICUSPID VALVE MV Area (PHT): 5.62 cm     TR Peak grad:   35.5 mmHg MV Area VTI:   2.76 cm     TR Vmax:        298.00 cm/s MV Peak grad:  6.2 mmHg MV Mean grad:  3.0 mmHg     SHUNTS MV Vmax:       1.25 m/s     Systemic VTI:  0.17 m MV Vmean:      75.2 cm/s    Systemic Diam: 1.90 cm MV Decel Time: 135 msec MV E velocity: 105.00 cm/s Yvonne Kendall MD Electronically signed by Yvonne Kendall MD Signature Date/Time: 10/07/2023/1:03:11 PM    Final  MR BRAIN WO CONTRAST Result Date: 10/06/2023 CLINICAL DATA:  Syncope.  Dizziness.  Ataxia. EXAM: MRI HEAD WITHOUT CONTRAST TECHNIQUE: Multiplanar, multiecho pulse sequences of the brain and surrounding structures were obtained without intravenous contrast. COMPARISON:  CTA head 10/06/2023.  MRI head 03/29/2022. FINDINGS: Brain: There is no evidence of an acute infarct, intracranial hemorrhage, mass, midline shift, or extra-axial fluid collection. A partially empty sella is unchanged. Cerebral volume is normal. The ventricles are normal in  size. 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 No significant cerebral white matter disease is evident. Vascular: Major intracranial vascular flow voids are preserved. 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 Skull and upper cervical spine: Diffusely diminished bone marrow T1 signal intensity, chronic and nonspecific but can be seen with anemia, smoking, and obesity. Sinuses/Orbits: Unremarkable orbits. Chronic right maxillary sinusitis. Trace fluid or mucosal thickening in the mastoid air cells bilaterally. Other: None. IMPRESSION: 1. No acute intracranial abnormality. 2. Small chronic cerebellar infarcts. 3. Partially empty sella, often reflecting normal anatomic variation though can also be seen with idiopathic intracranial hypertension. Electronically Signed   By: Sebastian Ache M.D.   On: 10/06/2023 17:02   CT Angio Chest PE W and/or Wo Contrast Result Date: 10/06/2023 CLINICAL DATA:  Syncope and elevated D-dimer EXAM: CT ANGIOGRAPHY CHEST WITH CONTRAST TECHNIQUE: Multidetector CT imaging of the chest was performed using the standard protocol during bolus administration of intravenous contrast. Multiplanar CT image reconstructions and MIPs were obtained to evaluate the vascular anatomy. RADIATION DOSE REDUCTION: This exam was performed according to the departmental dose-optimization program which includes automated exposure control, adjustment of the mA and/or kV according to patient size and/or use of iterative reconstruction technique. CONTRAST:  OMNIPAQUE IOHEXOL 350 MG/ML SOLN COMPARISON:  None Available. FINDINGS: Cardiovascular: The study is high quality for the evaluation of pulmonary embolism. There are no filling defects in the central, lobar, segmental or subsegmental pulmonary artery branches to suggest acute pulmonary embolism. Dilated main pulmonary artery measures 3.2 cm. Multichamber cardiomegaly. No significant pericardial fluid/thickening.  Reflux of contrast material into the hepatic veins, suggesting a degree of right heart dysfunction. Mediastinum/Nodes: Imaged thyroid gland without nodules meeting criteria for imaging follow-up by size. Mildly patulous esophagus. 10 mm pretracheal lymph nodes (7:26, 35). Lungs/Pleura: The central airways are patent. Patchy mosaic attenuation and diffuse peribronchovascular ground-glass opacities. No pneumothorax. Small bilateral pleural. Upper abdomen: Normal. Musculoskeletal: No acute or abnormal lytic or blastic osseous lesions. Review of the MIP images confirms the above findings. IMPRESSION: 1. No evidence of pulmonary embolism. 2. Multichamber cardiomegaly with reflux of contrast material into the hepatic veins, suggesting a degree of right heart dysfunction. 3. Patchy mosaic attenuation and diffuse peribronchovascular ground-glass opacities, which may be seen in the setting of pulmonary edema. 4. Small bilateral pleural effusions. 5. Dilated main pulmonary artery, which can be seen in the setting of pulmonary hypertension. Electronically Signed   By: Agustin Cree M.D.   On: 10/06/2023 13:04   CT Angio Head W or Wo Contrast Result Date: 10/06/2023 CLINICAL DATA:  43 year old female with dizziness and ataxia. Struck head with loss of consciousness. EXAM: CT ANGIOGRAPHY HEAD WITH AND WITHOUT CONTRAST TECHNIQUE: Multidetector CT imaging of the head was performed using the standard protocol during bolus administration of intravenous contrast. Multiplanar CT image reconstructions and MIPs were obtained to evaluate the vascular anatomy. Carotid stenosis measurements (when applicable) are obtained utilizing NASCET criteria, using the distal internal carotid diameter as the denominator. RADIATION DOSE REDUCTION: This exam was  performed according to the departmental dose-optimization program which includes automated exposure control, adjustment of the mA and/or kV according to patient size and/or use of iterative  reconstruction technique. CONTRAST:  OMNIPAQUE IOHEXOL 350 MG/ML SOLN COMPARISON:  Head CT 01/15/2023.  Brain MRI 03/29/2022. FINDINGS: CT HEAD Brain: Stable cerebral volume. No midline shift, ventriculomegaly, mass effect, evidence of mass lesion, intracranial hemorrhage or evidence of cortically based acute infarction. Gray-white matter differentiation is within normal limits throughout the brain. Calvarium and skull base: Intact. No acute osseous abnormality identified. Paranasal sinuses: Progressed right maxillary sinus disease since last year, total visible sinus opacification there now. New right ethmoid sinus mucosal thickening. Other Visualized paranasal sinuses and mastoids are stable and well aerated. Orbits: No acute orbit or scalp soft tissue finding. CTA HEAD Posterior circulation: Distal vertebral arteries appear codominant and patent to the vertebrobasilar junction. PICA origins are patent. No distal vertebral plaque or stenosis identified. Patent although diminutive basilar artery. No basilar stenosis. SCA and right PCA origins are patent. Fetal type left PCA origin. Right posterior communicating artery is also present. Bilateral PCA branches are within normal limits. Anterior circulation: Distal cervical ICAs, both ICA siphons are patent. No siphon plaque or stenosis is identified. Normal posterior communicating artery origins. Patent carotid termini. Patent MCA and ACA origins. Dominant right A1. Normal anterior communicating artery. Diminutive left A1. Bilateral ACA branches are within normal limits. Left MCA M1 segment and bifurcation are patent without stenosis. Right MCA M1 segment and bifurcation are patent without stenosis. Bilateral MCA branches are within normal limits. Venous sinuses: Early contrast timing, not well evaluated. Anatomic variants: Posterior circulation including fetal type left PCA origin, highly dominant right ACA A1. Review of the MIP images confirms the above  findings IMPRESSION: 1. Negative CTA Head.  Stable and normal CT appearance of the brain. 2. Progressed right maxillary and ethmoid sinus disease since last year. Electronically Signed   By: Odessa Fleming M.D.   On: 10/06/2023 12:25    EKG:10/06/2023 - ST at 108bpm (personally reviewed)  TELEMETRY: ST 90-110s (personally reviewed)    Assessment/Plan: #) inappropriate sinus tach #) HFrEF, new #) NICM #) syncope, recurrent #) ?family history of SCD  Patient presented with witnessed syncope episodewith prodrome of dizziness.  Updated TTE with reduced LVEF, R/L HC done with normal cors .  Do not favor sinus tach as the cause of reduced LVEF Awaiting cardiac MRI which will determine next steps Consider EPS if cMRI significantly abnormal Agree with genetics evaluation  Keep K > 4, Mag > 2, appreciate pharm assistance with repletion  Advised no driving for 6 months per [place] for unexplained syncope           For questions or updates, please contact [facility] Please consult [email or web address] for contact info under Cardiology/STEMI.  Signed, Sherie Don, NP  10/10/2023 1:02 PM

## 2023-10-10 NOTE — Plan of Care (Signed)
  Problem: Cardiac: Goal: Will achieve and/or maintain adequate cardiac output Outcome: Progressing   Problem: Coping: Goal: Ability to adjust to condition or change in health will improve Outcome: Progressing   Problem: Fluid Volume: Goal: Ability to maintain a balanced intake and output will improve Outcome: Progressing   Problem: Health Behavior/Discharge Planning: Goal: Ability to identify and utilize available resources and services will improve Outcome: Progressing   Problem: Nutritional: Goal: Maintenance of adequate nutrition will improve Outcome: Progressing Goal: Progress toward achieving an optimal weight will improve Outcome: Progressing   Problem: Skin Integrity: Goal: Risk for impaired skin integrity will decrease Outcome: Progressing   Problem: Health Behavior/Discharge Planning: Goal: Ability to manage health-related needs will improve Outcome: Progressing   Problem: Clinical Measurements: Goal: Will remain free from infection Outcome: Progressing Goal: Diagnostic test results will improve Outcome: Progressing Goal: Cardiovascular complication will be avoided Outcome: Progressing   Problem: Nutrition: Goal: Adequate nutrition will be maintained Outcome: Progressing

## 2023-10-10 NOTE — TOC Progression Note (Signed)
 Transition of Care [facility]) - Progression Note    Patient Details  Name: Mary Velasquez MRN: [id number] Date of Birth: January 17, 1981  Transition of Care [facility]) CM/SW Contact  Liliana Cline, LCSW Phone Number: 10/10/2023, 4:55 PM  Clinical Narrative:    CSW received a call from [place] with [employer]. Sent updated note to Irving Burton per her request.        Expected Discharge Plan and Services                                               Social Determinants of Health (SDOH) Interventions SDOH Screenings   Food Insecurity: No Food Insecurity (10/09/2023)  Housing: Low Risk  (10/09/2023)  Transportation Needs: No Transportation Needs (10/09/2023)  Utilities: Not At Risk (10/07/2023)  Financial Resource Strain: High Risk (10/09/2023)  Tobacco Use: Low Risk  (10/09/2023)    Readmission Risk Interventions     No data to display

## 2023-10-10 NOTE — Telephone Encounter (Signed)
error 

## 2023-10-10 NOTE — Plan of Care (Signed)
 Problem: Education: Goal: Knowledge of condition and prescribed therapy will improve Outcome: Progressing   Problem: Cardiac: Goal: Will achieve and/or maintain adequate cardiac output Outcome: Progressing   Problem: Physical Regulation: Goal: Complications related to the disease process, condition or treatment will be avoided or minimized Outcome: Progressing   Problem: Education: Goal: Ability to describe self-care measures that may prevent or decrease complications (Diabetes Survival Skills Education) will improve Outcome: Progressing Goal: Individualized Educational Video(s) Outcome: Progressing   Problem: Coping: Goal: Ability to adjust to condition or change in health will improve Outcome: Progressing   Problem: Fluid Volume: Goal: Ability to maintain a balanced intake and output will improve Outcome: Progressing   Problem: Health Behavior/Discharge Planning: Goal: Ability to identify and utilize available resources and services will improve Outcome: Progressing Goal: Ability to manage health-related needs will improve Outcome: Progressing   Problem: Metabolic: Goal: Ability to maintain appropriate glucose levels will improve Outcome: Progressing   Problem: Nutritional: Goal: Maintenance of adequate nutrition will improve Outcome: Progressing Goal: Progress toward achieving an optimal weight will improve Outcome: Progressing   Problem: Skin Integrity: Goal: Risk for impaired skin integrity will decrease Outcome: Progressing   Problem: Tissue Perfusion: Goal: Adequacy of tissue perfusion will improve Outcome: Progressing   Problem: Education: Goal: Knowledge of General Education information will improve Description: Including pain rating scale, medication(s)/side effects and non-pharmacologic comfort measures Outcome: Progressing   Problem: Health Behavior/Discharge Planning: Goal: Ability to manage health-related needs will improve Outcome:  Progressing   Problem: Clinical Measurements: Goal: Ability to maintain clinical measurements within normal limits will improve Outcome: Progressing Goal: Will remain free from infection Outcome: Progressing Goal: Diagnostic test results will improve Outcome: Progressing Goal: Respiratory complications will improve Outcome: Progressing Goal: Cardiovascular complication will be avoided Outcome: Progressing   Problem: Activity: Goal: Risk for activity intolerance will decrease Outcome: Progressing   Problem: Nutrition: Goal: Adequate nutrition will be maintained Outcome: Progressing   Problem: Coping: Goal: Level of anxiety will decrease Outcome: Progressing   Problem: Elimination: Goal: Will not experience complications related to bowel motility Outcome: Progressing Goal: Will not experience complications related to urinary retention Outcome: Progressing   Problem: Pain Managment: Goal: General experience of comfort will improve and/or be controlled Outcome: Progressing   Problem: Safety: Goal: Ability to remain free from injury will improve Outcome: Progressing   Problem: Skin Integrity: Goal: Risk for impaired skin integrity will decrease Outcome: Progressing   Problem: Education: Goal: Understanding of CV disease, CV risk reduction, and recovery process will improve Outcome: Progressing Goal: Individualized Educational Video(s) Outcome: Progressing   Problem: Activity: Goal: Ability to return to baseline activity level will improve Outcome: Progressing   Problem: Cardiovascular: Goal: Ability to achieve and maintain adequate cardiovascular perfusion will improve Outcome: Progressing Goal: Vascular access site(s) Level 0-1 will be maintained Outcome: Progressing   Problem: Health Behavior/Discharge Planning: Goal: Ability to safely manage health-related needs after discharge will improve Outcome: Progressing   Problem: Education: Goal: Knowledge of  condition and prescribed therapy will improve Outcome: Progressing   Problem: Cardiac: Goal: Will achieve and/or maintain adequate cardiac output Outcome: Progressing   Problem: Physical Regulation: Goal: Complications related to the disease process, condition or treatment will be avoided or minimized Outcome: Progressing   Problem: Education: Goal: Ability to describe self-care measures that may prevent or decrease complications (Diabetes Survival Skills Education) will improve Outcome: Progressing Goal: Individualized Educational Video(s) Outcome: Progressing   Problem: Coping: Goal: Ability to adjust to condition or change in health will improve  Outcome: Progressing   Problem: Fluid Volume: Goal: Ability to maintain a balanced intake and output will improve Outcome: Progressing   Problem: Health Behavior/Discharge Planning: Goal: Ability to identify and utilize available resources and services will improve Outcome: Progressing Goal: Ability to manage health-related needs will improve Outcome: Progressing   Problem: Metabolic: Goal: Ability to maintain appropriate glucose levels will improve Outcome: Progressing   Problem: Nutritional: Goal: Maintenance of adequate nutrition will improve Outcome: Progressing Goal: Progress toward achieving an optimal weight will improve Outcome: Progressing   Problem: Skin Integrity: Goal: Risk for impaired skin integrity will decrease Outcome: Progressing   Problem: Tissue Perfusion: Goal: Adequacy of tissue perfusion will improve Outcome: Progressing   Problem: Education: Goal: Knowledge of General Education information will improve Description: Including pain rating scale, medication(s)/side effects and non-pharmacologic comfort measures Outcome: Progressing   Problem: Health Behavior/Discharge Planning: Goal: Ability to manage health-related needs will improve Outcome: Progressing   Problem: Clinical  Measurements: Goal: Ability to maintain clinical measurements within normal limits will improve Outcome: Progressing Goal: Will remain free from infection Outcome: Progressing Goal: Diagnostic test results will improve Outcome: Progressing Goal: Respiratory complications will improve Outcome: Progressing Goal: Cardiovascular complication will be avoided Outcome: Progressing   Problem: Activity: Goal: Risk for activity intolerance will decrease Outcome: Progressing   Problem: Nutrition: Goal: Adequate nutrition will be maintained Outcome: Progressing   Problem: Coping: Goal: Level of anxiety will decrease Outcome: Progressing   Problem: Elimination: Goal: Will not experience complications related to bowel motility Outcome: Progressing Goal: Will not experience complications related to urinary retention Outcome: Progressing   Problem: Pain Managment: Goal: General experience of comfort will improve and/or be controlled Outcome: Progressing   Problem: Safety: Goal: Ability to remain free from injury will improve Outcome: Progressing   Problem: Skin Integrity: Goal: Risk for impaired skin integrity will decrease Outcome: Progressing   Problem: Education: Goal: Understanding of CV disease, CV risk reduction, and recovery process will improve Outcome: Progressing Goal: Individualized Educational Video(s) Outcome: Progressing   Problem: Activity: Goal: Ability to return to baseline activity level will improve Outcome: Progressing   Problem: Cardiovascular: Goal: Ability to achieve and maintain adequate cardiovascular perfusion will improve Outcome: Progressing Goal: Vascular access site(s) Level 0-1 will be maintained Outcome: Progressing   Problem: Health Behavior/Discharge Planning: Goal: Ability to safely manage health-related needs after discharge will improve Outcome: Progressing

## 2023-10-10 NOTE — Progress Notes (Signed)
 Patient had a hypoglycemic event (see note)  after that when RN checked BP, BP was noted to be low. RN rechecked it multiple times, patient denies complaints but is sleepier than normal . Charge nurse and ICU nurse also came to bedside to assist. Patient will arouse to voice but then falls right back to sleep. Manual blood pressure also resulted in a low reading. Patient states she received an extra dose of lasix after the cardiac cath. Patient states she was urinating frequently but did not save for the nurse. Patient instructed to save urine so nurse can measure it. SN called Manuela Schwartz NP and orders received for midodrine and albumin. Medications given and BP began trending back up. Patient still very sleepy but will awake briefly and can answer questions. RN will continue to monitor.

## 2023-10-10 NOTE — Progress Notes (Addendum)
 Hypoglycemic Event  CBG: 30  Treatment: 8 oz juice/soda  Symptoms: Shaky  Follow-up CBG: Time:0440 CBG Result:60  Possible Reasons for Event: Medication regimen: Patient was given 15 units at bedtime per the sliding scale for high BG level and patient reports she has been off her usual BG regiment . Patient treated with graham crackers and ice cream until her blood sugar came up to a normal range,  Comments/MD notified:Brenda Jon Billings NP at 0500 . Patient called this nurse to the bedside to check her BG and she knew her BG was low.     Dona Klemann  Candace Cruise

## 2023-10-10 NOTE — TOC Progression Note (Signed)
 Transition of Care [facility]) - Progression Note    Patient Details  Name: Mary Velasquez MRN: [id number] Date of Birth: [birth year]  Transition of Care [facility]) CM/SW Contact  Truddie Hidden, RN Phone Number: 10/10/2023, 3:37 PM  Clinical Narrative:    Retrieved request from Irving Burton at [facility] for order, face sheet, echo, and cath results.   Requested documents sent to [email or web address]        Expected Discharge Plan and Services                                               Social Determinants of Health (SDOH) Interventions SDOH Screenings   Food Insecurity: No Food Insecurity (10/09/2023)  Housing: Low Risk  (10/09/2023)  Transportation Needs: No Transportation Needs (10/09/2023)  Utilities: Not At Risk (10/07/2023)  Financial Resource Strain: High Risk (10/09/2023)  Tobacco Use: Low Risk  (10/09/2023)    Readmission Risk Interventions     No data to display

## 2023-10-10 NOTE — Telephone Encounter (Signed)
 Patient Product/process development scientist completed.    The patient is insured through [facility]. Patient has [employer], may use a copay card, and/or apply for patient assistance if available.    Ran test claim for Ozempic 2.5 mg and the current 30 day co-pay is $30.00.  Ran test claim for Mounjaro 2.5 mg and the current 30 day co-pay is $30.00.  Ran test claim for Dexcom G7 Sensors and the current 30 day co-pay is $30.00.  Ran test claim for [employer] 3 Sensors Plus and Not Covered  Ran test claim for Wegovy 2.5 mg and Not Covered  Ran test claim for Zepbound 2.5 mg and Not Covered  This test claim was processed through [employer]- copay amounts may vary at other pharmacies due to [employer], or as the patient moves through the different stages of their insurance plan.     Roland Earl, CPHT Pharmacy Technician III Certified Patient Advocate [facility] Patient Advocate Team Direct Number: [phone]  Fax: [phone]

## 2023-10-10 NOTE — Inpatient Diabetes Management (Signed)
 Inpatient Diabetes Program Recommendations  AACE/ADA: New Consensus Statement on Inpatient Glycemic Control (2015)  Target Ranges:  Prepandial:   less than 140 mg/dL      Peak postprandial:   less than 180 mg/dL (1-2 hours)      Critically ill patients:  140 - 180 mg/dL   Lab Results  Component Value Date   GLUCAP 161 (H) 10/10/2023   HGBA1C 15.4 (H) 01/15/2023    Review of Glycemic Control  Latest Reference Range & Units 10/09/23 21:09 10/10/23 03:59 10/10/23 04:20 10/10/23 04:42 10/10/23 05:11 10/10/23 07:48  Glucose-Capillary 70 - 99 mg/dL 829 (H) 30 (LL) 60 (L) 94 146 (H) 161 (H)   Diabetes history: DM 2 Outpatient Diabetes medications:  Novolog 4 units tid with meals Lantus 14 units q HS Metformin 1000 mg bid Current orders for Inpatient glycemic control:  Novolog 0-15 units tid with meals  Semglee 4 units q HS  Inpatient Diabetes Program Recommendations:    Note low blood sugar overnight after patient received Novolog 15 units x 1. Recommend reducing Novolog correction to very sensitive (0-6 units) tid with meals. May need low dose meal coverage added (2 units tid with meals)- however this should only be given if patient eats >50% of meal.   Thanks,  Lorenza Cambridge, RN, BC-ADM Inpatient Diabetes Coordinator Pager [phone]  (8a-5p)

## 2023-10-10 NOTE — Progress Notes (Addendum)
 Advanced Heart Failure Rounding Note   Subjective:     Cath yesterday with no CAD. Elevated filling pressures with normal CO  RA 10 PA 43/21 (32) PCWM 28 Fick 5.1/2.9  No events on tele overnight  Did have low CBGs last night. BP dropped as well and started on midodrine.   Diuresed well with IV lasix. Denies CP or SOB    Objective:   Weight Range:  Vital Signs:   Temp:  [97.6 F (36.4 C)-98.6 F (37 C)] 97.6 F (36.4 C) (04/11 0747) Pulse Rate:  [91-111] 99 (04/11 0747) Resp:  [11-25] 17 (04/11 0747) BP: (72-145)/(50-105) 120/84 (04/11 0900) SpO2:  [87 %-100 %] 94 % (04/11 0747) Weight:  [77.4 kg] 77.4 kg (04/11 0510) Last BM Date : 10/08/23  Weight change: Filed Weights   10/06/23 0854 10/09/23 0500 10/10/23 0510  Weight: 72.6 kg 78.3 kg 77.4 kg    Intake/Output:   Intake/Output Summary (Last 24 hours) at 10/10/2023 1103 Last data filed at 10/10/2023 1053 Gross per 24 hour  Intake 1028.14 ml  Output 500 ml  Net 528.14 ml     Physical Exam: General:  Well appearing. No resp difficulty HEENT: normal Neck: supple. JVP 7-8 . Carotids 2+ bilat; no bruits. No lymphadenopathy or thryomegaly appreciated. Cor: PMI nondisplaced. Regular rate & rhythm. No rubs, gallops or murmurs. Lungs: clear Abdomen: soft, nontender, nondistended. No hepatosplenomegaly. No bruits or masses. Good bowel sounds. Extremities: no cyanosis, clubbing, rash, edema Neuro: alert & orientedx3, cranial nerves grossly intact. moves all 4 extremities w/o difficulty. Affect pleasant  Telemetry: Sinus 90-100 no ectopy. Personally reviewed  Labs: Basic Metabolic Panel: Recent Labs  Lab 10/06/23 1013 10/07/23 0547 10/08/23 0507 10/09/23 0919 10/09/23 1502 10/09/23 1507 10/09/23 1508 10/09/23 1840 10/09/23 2239 10/10/23 0431  NA 133* 139 137 137 141 142 142  --   --  138  K 4.4 3.4* 3.6 3.5 3.4* 3.4* 3.4*  --   --  2.8*  CL 99 105 102 99  --   --   --   --   --  94*  CO2 26 27 30  29   --   --   --   --   --  33*  GLUCOSE 162* 60* 116* 157*  --   --   --   --  360* 106*  BUN 15 14 12 14   --   --   --   --   --  15  CREATININE 1.12* 0.85 0.72 0.74  --   --   --  0.86  --  0.89  CALCIUM 8.1* 8.1* 7.9* 7.7*  --   --   --   --   --  8.0*  MG  --   --   --   --   --   --   --   --   --  1.2*    Liver Function Tests: No results for input(s): "AST", "ALT", "ALKPHOS", "BILITOT", "PROT", "ALBUMIN" in the last 168 hours. No results for input(s): "LIPASE", "AMYLASE" in the last 168 hours. No results for input(s): "AMMONIA" in the last 168 hours.  CBC: Recent Labs  Lab 10/06/23 1013 10/07/23 0547 10/09/23 1502 10/09/23 1507 10/09/23 1508 10/09/23 1840  WBC 9.0 6.8  --   --   --  6.9  NEUTROABS 7.4 4.6  --   --   --   --   HGB 10.6* 9.9* 10.9* 10.9* 10.9* 11.1*  HCT 33.1*  30.2* 32.0* 32.0* 32.0* 33.9*  MCV 86.6 84.6  --   --   --  86.7  PLT 299 296  --   --   --  333    Cardiac Enzymes: No results for input(s): "CKTOTAL", "CKMB", "CKMBINDEX", "TROPONINI" in the last 168 hours.  BNP: BNP (last 3 results) Recent Labs    10/06/23 1013  BNP 235.0*    ProBNP (last 3 results) No results for input(s): "PROBNP" in the last 8760 hours.    Other results:  Imaging: CARDIAC CATHETERIZATION Result Date: 10/09/2023   LV end diastolic pressure is severely elevated.   Hemodynamic findings consistent with mild pulmonary hypertension.   There is no aortic valve stenosis.   Anticipated discharge date to be determined.   Patient will be started on 40 mg IV twice daily Lasix.   Cardiac MRI ordered per advanced heart failure team-Dr. Gala Romney.   No indication for antiplatelet therapy at this time . 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 LV P-EDP 136/2-36 mmHg;  AoP-MAP 134/88-108 mmHg. AO sat 88%, PA sat 57% on room air. Fick cardiac output-index 5.08-2.89. PVR 1 89D/S; index through 3 32D/SI  PLAN OF CARE:  Patient will return to nursing unit after TR band removal.  Will order 40 mg IV Lasix twice daily and cardiac MRI.   Patient will be started on 40 mg IV twice daily Lasix.   Cardiac MRI ordered per advanced heart failure team-Dr. Gala Romney.   No indication for antiplatelet therapy at this time . Mary Lemma, MD    Medications:     Scheduled Medications:  enoxaparin (LOVENOX) injection  40 mg Subcutaneous Q24H   ferrous sulfate  325 mg Oral Q breakfast   furosemide  40 mg Intravenous Once   gabapentin  600 mg Oral QHS   insulin aspart  0-9 Units Subcutaneous TID WC   insulin glargine-yfgn  4 Units Subcutaneous QHS   losartan  12.5 mg Oral Daily   metoprolol succinate  25 mg Oral Daily   nortriptyline  100 mg Oral QHS   potassium chloride  40 mEq Oral Q4H   sodium chloride flush  3 mL Intravenous Q12H   sodium chloride flush  3 mL Intravenous Q12H   spironolactone  12.5 mg Oral Daily    Infusions:  sodium chloride      PRN Medications: sodium chloride, acetaminophen **OR** acetaminophen, ondansetron **OR** ondansetron (ZOFRAN) IV, polyethylene glycol, sodium chloride flush   Assessment/Plan:   1. Syncope - high suspicion for arrhythmogenic cause in setting of reduced EF, sudden onset and family h/o SCD. However ECG and tele so far unremarkable. ? Related to her blood sugars - Cath with no CAD - cMRI pending for today - EP to see today (I d/w them)  - Consider ILR and Lifvest - have asked EP to weigh in on these   2. Acute systolic HF - echo EF 30-35% - cath no CAD - Volume status improving with IV diuresis - Now with contraction alkalosis. Stop lasix - BP dropped overnight and midodrine started but suspect she may not need this ( though there is some mention of previous (POTS/IST). Will hold for now and follow BP closely -   Add spiro 12.5 - Continue losartan  - Continue Toprol. - Will not add SGLT2i right now with  hypoglycemic event overnight  4. DM2  - per primary - last A1c 15.4 in 7/24 - will repeat    5. Hypokalemia/hypomag - supp aggressively  Await cMRI results and EP recs prior to further disposition   Length of Stay: 3   Mary Velasquez 10/10/2023, 11:03 AM  Advanced Heart Failure Team Pager [phone] (M-F; 7a - 4p)  Please contact [facility] for night-coverage after hours (4p -7a ) and weekends on [email or web address]   Addendum:  Case discussed with Dr. Lalla Brothers. Agree with plan for ILR and LifeVest. EP to arrange. Appreciate their input.   Mary Meres, MD  6:39 PM

## 2023-10-10 NOTE — Progress Notes (Signed)
 Progress Note   Patient: Mary Velasquez NFA:[id number] DOB: 01/24/1981 DOA: 10/06/2023     3 DOS: the patient was seen and examined on 10/10/2023   Brief hospital course:  Mary Velasquez is a 43 y.o. female with medical history significant of type 2 diabetes, recent hysterectomy complicated by post-op anemia requiring blood transfusion, inappropriate sinus tachycardia, brain AVM, peripheral neuropathy, who presents to the ED due to dizziness and syncope.    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  When she came to, she was laying on the ground and the resident was yelling her name.  She denies any prodromal symptoms other than persistent dizziness prior to the syncope episode.  She notes in the past, when she had dizziness or syncope, was due to heavy menstrual cycles but she has not had any menstrual cycle since hysterectomy.  She endorses chronic lower extremity swelling that is unchanged and new abdominal swelling that has been occurring for the last few months.  She denies any orthopnea, shortness of breath, chest pain, palpitations.  She denies any recent illness.   ED Course:  On arrival to the ED, patient was normotensive at 127/91 with heart rate of 108. She was saturating at 93% on room air. She was afebrile at 98. Initial work up notable for hemoglobin of 10.6, sodium 133, creatinine 112, glucose 162 GFR above 60.  Troponin 33 none 35.  D-dimer elevated 0.84.  CTA obtained with no evidence of PE, however Multichamber cardiomegaly with reflux of contrast into hepatic veins suggesting right heart dysfunction with patchy mosaic attenuation and groundglass opacities concerning for pulmonary edema.  CTA of the head was obtained with no evidence of acute  intracranial abnormality.  Patient started on aspirin, IV fluids, meclizine and Zofran.  TRH contacted for admission.        Assessment and Plan:  * Syncope Likely secondary to arrhythmia in the setting of cardiomyopathy Patient presented for evaluation of a 1 day history of dizziness with multiple falls and an episode of syncope today at work.  2D echocardiogram showed an LVEF of 30 to 35% with moderately decreased LV systolic function and global hypokinesis.  Right ventricular systolic function is mildly reduced with moderately elevated pulmonary artery systolic pressure Appreciate cardiology input.  EP consult recommended Patient will likely need a LifeVest upon discharge     Elevated troponin On presentation, patient's troponins were noted to be mildly elevated indicating demand ischemia.   CTA was obtained to rule out PE and notable for concern of right heart dysfunction, findings suggestive of pulmonary hypertension and pulmonary edema.  Appreciate cardiology input, 2D echocardiogram shows a reduced LVEF of 30 to 35% with LV global hypokinesis. Patient had a left and right heart cath which showed normal coronaries Awaiting results of cardiac MRI          Acute systolic CHF New onset cardiomyopathy Patient presented to the ER for evaluation after a witnessed syncopal episode at work CT angiogram showed multi chamber cardiomegaly with reflux of contrast material into the hepatic veins, suggesting a degree of right heart dysfunction. Patchy mosaic attenuation and diffuse peri bronchovascular ground-glass opacities, which may be seen in the setting of pulmonary edema. Small bilateral pleural effusions. Dilated  main pulmonary artery, which can be seen in the setting of pulmonary hypertension. 2D echocardiogram showed an LVEF of 30 to 35% with moderately decreased LV function and LV global hypokinesis. Right heart cath showed mild to moderate WHO class II pulmonary hypertension IV  Lasix has been discontinued due to contraction alkalosis and hypotension Continue metoprolol, spironolactone and losartan as blood pressure tolerates Patient not on an SGLT2 due to episode of hypoglycemia Patient will need a LifeVest on discharge       Hypoxia Patient was noted to have room air pulse oximetry post ambulation of 85% and is currently on 2 L of oxygen to maintain pulse oximetry greater than 92%. Hypoxia appears to be secondary to acute systolic CHF Will attempt to wean patient off oxygen once acute illness improves or resolves.         History of inappropriate sinus tachycardia Continue metoprolol     Normocytic anemia History of chronic anemia, initially due to chronic blood loss and then postoperative blood loss.   Hemoglobin appears stable at this time        DM (diabetes mellitus), type 2 with neurological complications [facility]) Patient had an episode of hypoglycemia earlier this morning probably iatrogenic Change sliding scale coverage to with meals No insulin coverage at bedtime Continue Semglee 4 units at bedtime  Maintain consistent carbohydrate diet       Obesity,Class 1 Complicates overall prognosis and care. Lifestyle modification exercise has been discussed with patient in detail.           Subjective: Patient is seen and examined at the bedside.  He has no new complaints  Physical Exam: Vitals:   10/10/23 0637 10/10/23 0747 10/10/23 0900 10/10/23 1138  BP: 100/68 109/79 120/84 (!) 130/93  Pulse: 91 99  (!) 110  Resp:  17    Temp:  97.6 F (36.4 C)  98 F (36.7 C)  TempSrc:    Oral  SpO2: 98% 94%  97%  Weight:      Height:        General: She is not in acute distress.    Appearance: She is obese. She is not toxic-appearing.  HENT:     Head: Normocephalic and atraumatic.     Mouth/Throat:     Mouth: Mucous membranes are moist.     Pharynx: Oropharynx is clear.  Eyes:     Extraocular Movements: Extraocular movements intact.      Conjunctiva/sclera: Conjunctivae normal.  Cardiovascular:     Rate and Rhythm: Regular rate and rhythm    Heart sounds: No murmur heard.    No gallop.  Pulmonary:     Effort: Pulmonary effort is normal. No respiratory distress.     Breath sounds: Normal breath sounds. No wheezing, rhonchi or rales.  Abdominal:     General: Bowel sounds are normal.  Central adiposity    Palpations: Abdomen is soft.     Tenderness: There is no abdominal tenderness. There is no guarding.  Musculoskeletal:     Right lower leg: No edema.     Left lower leg: No edema.  Skin:    General: Skin is warm and dry.  Neurological:     Mental Status: She is alert.     Comments:  Psychiatric:        Mood and Affect: Mood normal.        Behavior: Behavior normal.        Data Reviewed: Potassium 2.8, magnesium 1.2, bicarb 33 Labs reviewed  Family Communication: Plan of care was discussed with patient in detail.  She verbalizes understanding and agrees with the plan.  Disposition: Status is: Inpatient Remains inpatient appropriate because: Awaiting results of cardiac MRI  Planned Discharge Destination: Home    Time spent: 36 minutes  Author: Lucile Shutters, MD 10/10/2023 2:55 PM  For on call review [email or web address].

## 2023-10-11 DIAGNOSIS — I420 Dilated cardiomyopathy: Secondary | ICD-10-CM | POA: Diagnosis not present

## 2023-10-11 DIAGNOSIS — R55 Syncope and collapse: Secondary | ICD-10-CM | POA: Diagnosis not present

## 2023-10-11 LAB — GLUCOSE, CAPILLARY
Glucose-Capillary: 132 mg/dL — ABNORMAL HIGH (ref 70–99)
Glucose-Capillary: 181 mg/dL — ABNORMAL HIGH (ref 70–99)
Glucose-Capillary: 184 mg/dL — ABNORMAL HIGH (ref 70–99)
Glucose-Capillary: 190 mg/dL — ABNORMAL HIGH (ref 70–99)
Glucose-Capillary: 218 mg/dL — ABNORMAL HIGH (ref 70–99)
Glucose-Capillary: 229 mg/dL — ABNORMAL HIGH (ref 70–99)
Glucose-Capillary: 261 mg/dL — ABNORMAL HIGH (ref 70–99)

## 2023-10-11 LAB — BASIC METABOLIC PANEL WITH GFR
Anion gap: 6 (ref 5–15)
BUN: 18 mg/dL (ref 6–20)
CO2: 31 mmol/L (ref 22–32)
Calcium: 8.4 mg/dL — ABNORMAL LOW (ref 8.9–10.3)
Chloride: 101 mmol/L (ref 98–111)
Creatinine, Ser: 0.84 mg/dL (ref 0.44–1.00)
GFR, Estimated: >60 mL/min (ref >60)
Glucose, Bld: 225 mg/dL — ABNORMAL HIGH (ref 70–99)
Potassium: 4.6 mmol/L (ref 3.5–5.1)
Sodium: 138 mmol/L (ref 135–145)

## 2023-10-11 LAB — MAGNESIUM: Magnesium: 2.3 mg/dL (ref 1.7–2.4)

## 2023-10-11 NOTE — Plan of Care (Signed)
  Problem: Education: Goal: Knowledge of condition and prescribed therapy will improve Outcome: Progressing   Problem: Cardiac: Goal: Will achieve and/or maintain adequate cardiac output Outcome: Progressing   Problem: Physical Regulation: Goal: Complications related to the disease process, condition or treatment will be avoided or minimized Outcome: Progressing   Problem: Education: Goal: Ability to describe self-care measures that may prevent or decrease complications (Diabetes Survival Skills Education) will improve Outcome: Progressing Goal: Individualized Educational Video(s) Outcome: Progressing   Problem: Coping: Goal: Ability to adjust to condition or change in health will improve Outcome: Progressing   Problem: Fluid Volume: Goal: Ability to maintain a balanced intake and output will improve Outcome: Progressing   Problem: Health Behavior/Discharge Planning: Goal: Ability to identify and utilize available resources and services will improve Outcome: Progressing Goal: Ability to manage health-related needs will improve Outcome: Progressing   Problem: Metabolic: Goal: Ability to maintain appropriate glucose levels will improve Outcome: Progressing   Problem: Nutritional: Goal: Maintenance of adequate nutrition will improve Outcome: Progressing Goal: Progress toward achieving an optimal weight will improve Outcome: Progressing   Problem: Skin Integrity: Goal: Risk for impaired skin integrity will decrease Outcome: Progressing   Problem: Tissue Perfusion: Goal: Adequacy of tissue perfusion will improve Outcome: Progressing   Problem: Education: Goal: Knowledge of General Education information will improve Description: Including pain rating scale, medication(s)/side effects and non-pharmacologic comfort measures Outcome: Progressing   Problem: Health Behavior/Discharge Planning: Goal: Ability to manage health-related needs will improve Outcome:  Progressing   Problem: Clinical Measurements: Goal: Ability to maintain clinical measurements within normal limits will improve Outcome: Progressing Goal: Will remain free from infection Outcome: Progressing Goal: Diagnostic test results will improve Outcome: Progressing Goal: Respiratory complications will improve Outcome: Progressing Goal: Cardiovascular complication will be avoided Outcome: Progressing   Problem: Activity: Goal: Risk for activity intolerance will decrease Outcome: Progressing   Problem: Nutrition: Goal: Adequate nutrition will be maintained Outcome: Progressing   Problem: Coping: Goal: Level of anxiety will decrease Outcome: Progressing   Problem: Elimination: Goal: Will not experience complications related to bowel motility Outcome: Progressing Goal: Will not experience complications related to urinary retention Outcome: Progressing   Problem: Pain Managment: Goal: General experience of comfort will improve and/or be controlled Outcome: Progressing   Problem: Safety: Goal: Ability to remain free from injury will improve Outcome: Progressing   Problem: Skin Integrity: Goal: Risk for impaired skin integrity will decrease Outcome: Progressing   Problem: Education: Goal: Understanding of CV disease, CV risk reduction, and recovery process will improve Outcome: Progressing Goal: Individualized Educational Video(s) Outcome: Progressing   Problem: Activity: Goal: Ability to return to baseline activity level will improve Outcome: Progressing   Problem: Cardiovascular: Goal: Ability to achieve and maintain adequate cardiovascular perfusion will improve Outcome: Progressing Goal: Vascular access site(s) Level 0-1 will be maintained Outcome: Progressing   Problem: Health Behavior/Discharge Planning: Goal: Ability to safely manage health-related needs after discharge will improve Outcome: Progressing

## 2023-10-11 NOTE — Progress Notes (Signed)
 Progress Note  Patient Name: Mary Velasquez Date of Encounter: 10/11/2023  Primary Cardiologist: Agbor-Etang  Subjective   No chest pain, dyspnea, palpitations, dizziness, presyncope, or syncope.  Hemodynamically stable.  Potassium repleted.  Awaiting rep fitting for LifeVest.  Cardiac MRI overread pending.  Inpatient Medications    Scheduled Meds:  enoxaparin (LOVENOX) injection  40 mg Subcutaneous Q24H   ferrous sulfate  325 mg Oral Q breakfast   gabapentin  600 mg Oral QHS   insulin aspart  0-9 Units Subcutaneous TID WC   insulin glargine-yfgn  4 Units Subcutaneous QHS   losartan  12.5 mg Oral Daily   metoprolol succinate  25 mg Oral Daily   nortriptyline  100 mg Oral QHS   sodium chloride flush  3 mL Intravenous Q12H   sodium chloride flush  3 mL Intravenous Q12H   spironolactone  12.5 mg Oral Daily   Continuous Infusions:  PRN Meds: acetaminophen **OR** acetaminophen, ondansetron **OR** ondansetron (ZOFRAN) IV, polyethylene glycol, sodium chloride flush   Vital Signs    Vitals:   10/11/23 0046 10/11/23 0442 10/11/23 0500 10/11/23 0754  BP: (!) 147/91 (!) 159/83  100/75  Pulse: (!) 112 (!) 110  (!) 104  Resp: 18 18    Temp: 98.7 F (37.1 C) 97.9 F (36.6 C)  97.9 F (36.6 C)  TempSrc:    Oral  SpO2: 96% 95%  97%  Weight:   77.1 kg   Height:        Intake/Output Summary (Last 24 hours) at 10/11/2023 0852 Last data filed at 10/10/2023 2200 Gross per 24 hour  Intake 240 ml  Output --  Net 240 ml   Filed Weights   10/09/23 0500 10/10/23 0510 10/11/23 0500  Weight: 78.3 kg 77.4 kg 77.1 kg    Telemetry    Sinus tachycardia in the low 100s to 1-teens bpm - Personally Reviewed  ECG    No new tracings - Personally Reviewed  Physical Exam   GEN: No acute distress.   Neck: No JVD. Cardiac: Mildly tachycardic, no murmurs, rubs, or gallops.  Respiratory: Clear to auscultation bilaterally.  GI: Soft, nontender, non-distended.   MS: No edema; No  deformity. Neuro:  Alert and oriented x 3; Nonfocal.  Psych: Normal affect.  Labs    Chemistry Recent Labs  Lab 10/09/23 0919 10/09/23 1502 10/09/23 1508 10/09/23 1840 10/09/23 2239 10/10/23 0431 10/11/23 0336  NA 137   < > 142  --   --  138 138  K 3.5   < > 3.4*  --   --  2.8* 4.6  CL 99  --   --   --   --  94* 101  CO2 29  --   --   --   --  33* 31  GLUCOSE 157*  --   --   --  360* 106* 225*  BUN 14  --   --   --   --  15 18  CREATININE 0.74  --   --  0.86  --  0.89 0.84  CALCIUM 7.7*  --   --   --   --  8.0* 8.4*  GFRNONAA >60  --   --  >60  --  >60 >60  ANIONGAP 9  --   --   --   --  11 6   < > = values in this interval not displayed.     Hematology Recent Labs  Lab 10/06/23 1013 10/07/23 0547  10/09/23 1502 10/09/23 1507 10/09/23 1508 10/09/23 1840  WBC 9.0 6.8  --   --   --  6.9  RBC 3.82* 3.57*  --   --   --  3.91  HGB 10.6* 9.9*   < > 10.9* 10.9* 11.1*  HCT 33.1* 30.2*   < > 32.0* 32.0* 33.9*  MCV 86.6 84.6  --   --   --  86.7  MCH 27.7 27.7  --   --   --  28.4  MCHC 32.0 32.8  --   --   --  32.7  RDW 13.5 13.7  --   --   --  13.6  PLT 299 296  --   --   --  333   < > = values in this interval not displayed.    Cardiac EnzymesNo results for input(s): "TROPONINI" in the last 168 hours. No results for input(s): "TROPIPOC" in the last 168 hours.   BNP Recent Labs  Lab 10/06/23 1013  BNP 235.0*     DDimer  Recent Labs  Lab 10/06/23 1013  DDIMER 0.84*     Radiology      Cardiac Studies   2D echo 11/21/2022: 1. Left ventricular ejection fraction, by estimation, is 60 to 65%. The  left ventricle has normal function. The left ventricle has no regional  wall motion abnormalities. There is mild left ventricular hypertrophy.  Left ventricular diastolic parameters  are consistent with Grade I diastolic dysfunction (impaired relaxation).   2. Right ventricular systolic function is normal. The right ventricular  size is normal.   3. A small  pericardial effusion is present. There is no evidence of  cardiac tamponade.   4. The mitral valve is normal in structure. No evidence of mitral valve  regurgitation. No evidence of mitral stenosis.   5. The aortic valve is tricuspid. Aortic valve regurgitation is not  visualized. No aortic stenosis is present.   6. The inferior vena cava is normal in size with greater than 50%  respiratory variability, suggesting right atrial pressure of 3 mmHg.  __________  2D echo 10/07/2023: 1. Left ventricular ejection fraction, by estimation, is 30 to 35%. The  left ventricle has moderately decreased function. The left ventricle  demonstrates global hypokinesis. There is mild left ventricular  hypertrophy. Indeterminate diastolic filling due   to E-A fusion. The average left ventricular global longitudinal strain is  -6.6 %. The global longitudinal strain is abnormal.   2. Right ventricular systolic function is mildly reduced. The right  ventricular size is normal. There is moderately elevated pulmonary artery  systolic pressure.   3. The mitral valve is normal in structure. Moderate mitral valve  regurgitation. No evidence of mitral stenosis.   4. Tricuspid valve regurgitation is mild to moderate.   5. The aortic valve is tricuspid. Aortic valve regurgitation is mild.   6. The inferior vena cava is dilated in size with <50% respiratory  variability, suggesting right atrial pressure of 15 mmHg.   Comparison(s): A prior study was performed on 11/21/2022. The left  ventricular function is significantly worse.  __________  [facility] 10/09/2023:   LV end diastolic pressure is severely elevated.   Hemodynamic findings consistent with mild pulmonary hypertension.   There is no aortic valve stenosis.   Anticipated discharge date to be determined.   Patient will be started on 40 mg IV twice daily Lasix.   Cardiac MRI ordered per advanced heart failure team-Dr. Julane Ny.   No indication for  antiplatelet  therapy at this time .     POST-OPERATIVE DIAGNOSIS: Angiographically normal coronary arteries with a right dominant system  Mild to moderate WHO Class II Pulm Hypertension: RAP mean 10 mmHg, RV P-EDP 42/6-21 mmHg PAP-mean 43/21-32 millimercury; PCWP mean more consistent with 28 mmHg. LV P-EDP 136/2-36 mmHg; AoP-MAP 134/88-108 mmHg. AO sat 88%, PA sat 57% on room air. Fick cardiac output-index 5.08-2.89. PVR 1 89D/S; index through 3 32D/SI     PLAN OF CARE:  Patient will return to nursing unit after TR band removal.  Will order 40 mg IV Lasix twice daily and cardiac MRI.   Patient will be started on 40 mg IV twice daily Lasix.   Cardiac MRI ordered per advanced heart failure team-Dr. Julane Ny.   No indication for antiplatelet therapy at this time . __________  cMRI pending   Assessment & Plan    1. Syncope - High suspicion for arrhythmogenic cause in setting of reduced EF, sudden onset and family h/o SCD. However ECG and tele so far unremarkable. ? - Query related to her blood sugars - Cath with no CAD - cMRI pending  - Evaluated by EP with further concern for arrhythmogenic etiology  - Plan for LifeVest prior to discharge (EP has contacted rep for fitting)  - Consider ILR  - No driving for 6 months from date of last syncopal episode per [place] DMV restrictions    2. Acute systolic HF - Echo EF 30-35% - Cath no CAD - Volume status improved with IV diuresis, now off  - Now with contraction alkalosis. Stop lasix - BP stable, received one-dose of midodrine, now off - Continue spiro 12.5 - Continue losartan  - Continue Toprol - Will not add SGLT2i right now with hypoglycemic event overnight - Following optimization of GDMT if her EF does not improve, will need to consider ICD per EP, pending A1c trend (improving from 15.4 to 10.4 from 12/2022 to this admission)   4. DM2 - Per primary - Last A1c improving from 15.4 in 12/2022 to 10.4 this admission    5. Hypokalemia/hypomag -  Repleted    Await cMRI results and EP recs prior to further disposition       For questions or updates, please contact [facility] Please consult [email or web address] for contact info under Cardiology/STEMI.    Signed, Varney Gentleman, PA-C [facility] Pager: [phone] 10/11/2023, 8:52 AM

## 2023-10-11 NOTE — Progress Notes (Signed)
 Progress Note   Patient: Mary Velasquez ZOX:[id number] DOB: October 13, 1980 DOA: 10/06/2023     4 DOS: the patient was seen and examined on 10/11/2023   Brief hospital course:  Mary Velasquez is a 43 y.o. female with medical history significant of type 2 diabetes, recent hysterectomy complicated by post-op anemia requiring blood transfusion, inappropriate sinus tachycardia, brain AVM, peripheral neuropathy, who presents to the ED due to dizziness and syncope.    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  When she came to, she was laying on the ground and the resident was yelling her name.  She denies any prodromal symptoms other than persistent dizziness prior to the syncope episode.  She notes in the past, when she had dizziness or syncope, was due to heavy menstrual cycles but she has not had any menstrual cycle since hysterectomy.  She endorses chronic lower extremity swelling that is unchanged and new abdominal swelling that has been occurring for the last few months.  She denies any orthopnea, shortness of breath, chest pain, palpitations.  She denies any recent illness.   ED Course:  On arrival to the ED, patient was normotensive at 127/91 with heart rate of 108. She was saturating at 93% on room air. She was afebrile at 98. Initial work up notable for hemoglobin of 10.6, sodium 133, creatinine 112, glucose 162 GFR above 60.  Troponin 33 none 35.  D-dimer elevated 0.84.  CTA obtained with no evidence of PE, however Multichamber cardiomegaly with reflux of contrast into hepatic veins suggesting right heart dysfunction with patchy mosaic attenuation and groundglass opacities concerning for pulmonary edema.  CTA of the head was obtained with no evidence of acute  intracranial abnormality.  Patient started on aspirin, IV fluids, meclizine and Zofran.  TRH contacted for admission.   04/11 Hospital course was complicated by an episode of hypoglycemia  Assessment and Plan:  * Syncope Likely secondary to arrhythmia in the setting of cardiomyopathy Patient presented for evaluation of a 1 day history of dizziness with multiple falls and an episode of syncope today at work.  2D echocardiogram showed an LVEF of 30 to 35% with moderately decreased LV systolic function and global hypokinesis.  Right ventricular systolic function is mildly reduced with moderately elevated pulmonary artery systolic pressure Appreciate cardiology input.  EP consult recommended Patient will likely need a LifeVest upon discharge     Elevated troponin On presentation, patient's troponins were noted to be mildly elevated indicating demand ischemia.   CTA was obtained to rule out PE and notable for concern of right heart dysfunction, findings suggestive of pulmonary hypertension and pulmonary edema.  Appreciate cardiology input, 2D echocardiogram shows a reduced LVEF of 30 to 35% with LV global hypokinesis. Patient had a left and right heart cath which showed normal coronaries consistent with nonischemic cardiomyopathy Awaiting results of cardiac MRI           Acute systolic CHF New onset nonischemic cardiomyopathy Patient presented to the ER for evaluation after a witnessed syncopal episode at work CT angiogram showed multi chamber cardiomegaly with reflux of contrast material into the hepatic veins, suggesting a degree of right heart dysfunction. Patchy mosaic attenuation and diffuse peri bronchovascular ground-glass opacities, which may be  seen in the setting of pulmonary edema. Small bilateral pleural effusions. Dilated main pulmonary artery, which can be seen in the setting of pulmonary hypertension. 2D echocardiogram showed an LVEF of 30 to 35% with moderately decreased LV  function and LV global hypokinesis. Right heart cath showed mild to moderate WHO class II pulmonary hypertension IV Lasix has been discontinued due to contraction alkalosis and hypotension Continue metoprolol, spironolactone and losartan as blood pressure tolerates Patient not on an SGLT2 due to episode of hypoglycemia Patient will need a LifeVest on discharge.  Awaiting LifeVest       Hypoxia Patient was noted to have room air pulse oximetry post ambulation of 85% and is currently on 2 L of oxygen to maintain pulse oximetry greater than 92%. Hypoxia appears to be secondary to acute systolic CHF Patient has been weaned off oxygen and will need a walk test prior to discharge       History of inappropriate sinus tachycardia Continue metoprolol     Normocytic anemia History of chronic anemia, initially due to chronic blood loss and then postoperative blood loss.   Hemoglobin appears stable at this time        DM (diabetes mellitus), type 2 with neurological complications [facility]) Patient had an episode of hypoglycemia 04/11 probably iatrogenic Change sliding scale coverage to with meals No insulin coverage at bedtime Continue Semglee 4 units at bedtime  Maintain consistent carbohydrate diet Blood sugars have been stable     Obesity,Class 1 Complicates overall prognosis and care. Lifestyle modification exercise has been discussed with patient in detail.            Subjective: Patient is seen and examined at the bedside.  No new complaints  Physical Exam: Vitals:   10/11/23 0442 10/11/23 0500 10/11/23 0754 10/11/23 1226  BP: (!) 159/83  100/75 120/85  Pulse: (!) 110  (!) 104 99  Resp: 18     Temp: 97.9 F (36.6 C)  97.9 F (36.6 C) 98.7 F (37.1 C)  TempSrc:   Oral   SpO2: 95%  97% 99%  Weight:  77.1 kg    Height:       General: She is not in acute distress.    Appearance: She is obese. She is not toxic-appearing.  HENT:     Head: Normocephalic and atraumatic.      Mouth/Throat:     Mouth: Mucous membranes are moist.     Pharynx: Oropharynx is clear.  Eyes:     Extraocular Movements: Extraocular movements intact.     Conjunctiva/sclera: Conjunctivae normal.  Cardiovascular:     Rate and Rhythm: Regular rate and rhythm    Heart sounds: No murmur heard.    No gallop.  Pulmonary:     Effort: Pulmonary effort is normal. No respiratory distress.     Breath sounds: Normal breath sounds. No wheezing, rhonchi or rales.  Abdominal:     General: Bowel sounds are normal.  Central adiposity    Palpations: Abdomen is soft.     Tenderness: There is no abdominal tenderness. There is no guarding.  Musculoskeletal:     Right lower leg: No edema.     Left lower leg: No edema.  Skin:    General: Skin is warm and dry.  Neurological:     Mental Status: She is alert.     Comments:  Psychiatric:        Mood and Affect: Mood normal.        Behavior: Behavior  normal.       Data Reviewed:  Labs reviewed.  Family Communication: Plan of care discussed with patient at the bedside.  She verbalizes understanding and agrees with the plan  Disposition: Status is: Inpatient Remains inpatient appropriate because: Awaiting LifeVest prior to discharge  Planned Discharge Destination: Home    Time spent: 35 minutes  Author: Read Camel, MD 10/11/2023 12:37 PM  For on call review [email or web address].

## 2023-10-11 NOTE — TOC Progression Note (Signed)
 Transition of Care [facility]) - Progression Note    Patient Details  Name: Mary Velasquez MRN: [id number] Date of Birth: January 09, 1981  [facility]) CM/SW Contact  Holland Lundborg, RN Phone Number: 10/11/2023, 1:35 PM  Clinical Narrative:     Call placed to [facility] with [employer] to inquire about timeframe that life vest will be available, voice message left.        Expected Discharge Plan and Services                                               Social Determinants of Health (SDOH) Interventions SDOH Screenings   Food Insecurity: No Food Insecurity (10/09/2023)  Housing: Low Risk  (10/09/2023)  Transportation Needs: No Transportation Needs (10/09/2023)  Utilities: Not At Risk (10/07/2023)  Financial Resource Strain: High Risk (10/09/2023)  Tobacco Use: Low Risk  (10/09/2023)    Readmission Risk Interventions     No data to display

## 2023-10-11 NOTE — Plan of Care (Signed)
  Problem: Activity: Goal: Risk for activity intolerance will decrease Outcome: Progressing   Problem: Nutrition: Goal: Adequate nutrition will be maintained Outcome: Progressing   Problem: Coping: Goal: Level of anxiety will decrease Outcome: Progressing   Problem: Elimination: Goal: Will not experience complications related to bowel motility Outcome: Progressing   Problem: Safety: Goal: Ability to remain free from injury will improve Outcome: Progressing   Problem: Skin Integrity: Goal: Risk for impaired skin integrity will decrease Outcome: Progressing   Problem: Education: Goal: Understanding of CV disease, CV risk reduction, and recovery process will improve Outcome: Progressing

## 2023-10-12 ENCOUNTER — Other Ambulatory Visit: Payer: Self-pay

## 2023-10-12 DIAGNOSIS — E669 Obesity, unspecified: Secondary | ICD-10-CM | POA: Diagnosis present

## 2023-10-12 DIAGNOSIS — I420 Dilated cardiomyopathy: Secondary | ICD-10-CM | POA: Diagnosis not present

## 2023-10-12 DIAGNOSIS — I5021 Acute systolic (congestive) heart failure: Secondary | ICD-10-CM | POA: Diagnosis present

## 2023-10-12 DIAGNOSIS — R55 Syncope and collapse: Secondary | ICD-10-CM | POA: Diagnosis not present

## 2023-10-12 DIAGNOSIS — R0902 Hypoxemia: Secondary | ICD-10-CM | POA: Diagnosis present

## 2023-10-12 LAB — BASIC METABOLIC PANEL WITH GFR
Anion gap: 7 (ref 5–15)
BUN: 19 mg/dL (ref 6–20)
CO2: 29 mmol/L (ref 22–32)
Calcium: 9.3 mg/dL (ref 8.9–10.3)
Chloride: 102 mmol/L (ref 98–111)
Creatinine, Ser: 0.88 mg/dL (ref 0.44–1.00)
GFR, Estimated: >60 mL/min (ref >60)
Glucose, Bld: 209 mg/dL — ABNORMAL HIGH (ref 70–99)
Potassium: 4.7 mmol/L (ref 3.5–5.1)
Sodium: 138 mmol/L (ref 135–145)

## 2023-10-12 LAB — GLUCOSE, CAPILLARY
Glucose-Capillary: 216 mg/dL — ABNORMAL HIGH (ref 70–99)
Glucose-Capillary: 180 mg/dL — ABNORMAL HIGH (ref 70–99)
Glucose-Capillary: 268 mg/dL — ABNORMAL HIGH (ref 70–99)

## 2023-10-12 LAB — LIPOPROTEIN A (LPA): Lipoprotein (a): 312.6 nmol/L — ABNORMAL HIGH (ref <75.0)

## 2023-10-12 MED ORDER — SPIRONOLACTONE 25 MG PO TABS
12.5000 mg | ORAL_TABLET | Freq: Every day | ORAL | 0 refills | Status: DC
  Filled 2023-10-12: qty 15, 30d supply, fill #0

## 2023-10-12 MED ORDER — METOPROLOL SUCCINATE ER 25 MG PO TB24
25.0000 mg | ORAL_TABLET | Freq: Every day | ORAL | 0 refills | Status: DC
  Filled 2023-10-12: qty 30, 30d supply, fill #0

## 2023-10-12 MED ORDER — INSULIN GLARGINE-YFGN 100 UNIT/ML ~~LOC~~ SOLN
8.0000 [IU] | Freq: Every day | SUBCUTANEOUS | Status: DC
  Filled 2023-10-12: qty 0.08

## 2023-10-12 MED ORDER — LOSARTAN POTASSIUM 25 MG PO TABS
12.5000 mg | ORAL_TABLET | Freq: Every day | ORAL | 0 refills | Status: DC
Start: 2023-10-13 — End: 2023-11-07
  Filled 2023-10-12: qty 15, 30d supply, fill #0

## 2023-10-12 NOTE — Plan of Care (Signed)
  Problem: Education: Goal: Knowledge of condition and prescribed therapy will improve Outcome: Progressing   Problem: Cardiac: Goal: Will achieve and/or maintain adequate cardiac output Outcome: Progressing   Problem: Physical Regulation: Goal: Complications related to the disease process, condition or treatment will be avoided or minimized Outcome: Progressing   Problem: Education: Goal: Ability to describe self-care measures that may prevent or decrease complications (Diabetes Survival Skills Education) will improve Outcome: Progressing Goal: Individualized Educational Video(s) Outcome: Progressing   Problem: Coping: Goal: Ability to adjust to condition or change in health will improve Outcome: Progressing   Problem: Fluid Volume: Goal: Ability to maintain a balanced intake and output will improve Outcome: Progressing   Problem: Health Behavior/Discharge Planning: Goal: Ability to identify and utilize available resources and services will improve Outcome: Progressing Goal: Ability to manage health-related needs will improve Outcome: Progressing   Problem: Metabolic: Goal: Ability to maintain appropriate glucose levels will improve Outcome: Progressing   Problem: Nutritional: Goal: Maintenance of adequate nutrition will improve Outcome: Progressing Goal: Progress toward achieving an optimal weight will improve Outcome: Progressing   Problem: Skin Integrity: Goal: Risk for impaired skin integrity will decrease Outcome: Progressing   Problem: Tissue Perfusion: Goal: Adequacy of tissue perfusion will improve Outcome: Progressing   Problem: Education: Goal: Knowledge of General Education information will improve Description: Including pain rating scale, medication(s)/side effects and non-pharmacologic comfort measures Outcome: Progressing   Problem: Health Behavior/Discharge Planning: Goal: Ability to manage health-related needs will improve Outcome:  Progressing   Problem: Clinical Measurements: Goal: Ability to maintain clinical measurements within normal limits will improve Outcome: Progressing Goal: Will remain free from infection Outcome: Progressing Goal: Diagnostic test results will improve Outcome: Progressing Goal: Respiratory complications will improve Outcome: Progressing Goal: Cardiovascular complication will be avoided Outcome: Progressing   Problem: Activity: Goal: Risk for activity intolerance will decrease Outcome: Progressing   Problem: Nutrition: Goal: Adequate nutrition will be maintained Outcome: Progressing   Problem: Coping: Goal: Level of anxiety will decrease Outcome: Progressing   Problem: Elimination: Goal: Will not experience complications related to bowel motility Outcome: Progressing Goal: Will not experience complications related to urinary retention Outcome: Progressing   Problem: Pain Managment: Goal: General experience of comfort will improve and/or be controlled Outcome: Progressing   Problem: Safety: Goal: Ability to remain free from injury will improve Outcome: Progressing   Problem: Skin Integrity: Goal: Risk for impaired skin integrity will decrease Outcome: Progressing   Problem: Education: Goal: Understanding of CV disease, CV risk reduction, and recovery process will improve Outcome: Progressing Goal: Individualized Educational Video(s) Outcome: Progressing   Problem: Activity: Goal: Ability to return to baseline activity level will improve Outcome: Progressing   Problem: Cardiovascular: Goal: Ability to achieve and maintain adequate cardiovascular perfusion will improve Outcome: Progressing Goal: Vascular access site(s) Level 0-1 will be maintained Outcome: Progressing   Problem: Health Behavior/Discharge Planning: Goal: Ability to safely manage health-related needs after discharge will improve Outcome: Progressing

## 2023-10-12 NOTE — TOC Transition Note (Signed)
 Transition of Care [facility]) - Discharge Note   Patient Details  Name: Mary Velasquez MRN: [id number] Date of Birth: 12/07/1980  [facility]) CM/SW Contact:  Holland Lundborg, RN Phone Number: 10/12/2023, 11:13 AM   Clinical Narrative:     Patient has discharge orders, state sister will provide transportation home.  Confirms she has received Technical sales engineer from [employer], has contact information in case of emergencies or questions.   Final next level of care: Home/Self Care Barriers to Discharge: Barriers Resolved   Patient Goals and CMS Choice Patient states their goals for this hospitalization and ongoing recovery are:: Home          Discharge Placement                       Discharge Plan and Services Additional resources added to the After Visit Summary for                  DME Arranged: Life vest DME Agency: [employer] Date DME Agency Contacted: 10/11/23   Representative spoke with at DME Agency: Sherline Distel            Social Drivers of Health (SDOH) Interventions SDOH Screenings   Food Insecurity: No Food Insecurity (10/09/2023)  Housing: Low Risk  (10/09/2023)  Transportation Needs: No Transportation Needs (10/09/2023)  Utilities: Not At Risk (10/07/2023)  Financial Resource Strain: High Risk (10/09/2023)  Tobacco Use: Low Risk  (10/09/2023)     Readmission Risk Interventions     No data to display

## 2023-10-12 NOTE — Discharge Summary (Signed)
 Physician Discharge Summary   Patient: Mary Velasquez MRN: [id number] DOB: 1980/08/08  Admit date:     10/06/2023  Discharge date: 10/12/23  Discharge Physician: Lachae Hohler   PCP: System, Provider Not In   Recommendations at discharge:      Discharge Diagnoses: Principal Problem:   Syncope and collapse Active Problems:   Dilated cardiomyopathy (HCC)   Acute systolic CHF (congestive heart failure) (HCC)   Hypoxia   DM (diabetes mellitus), type 2 with neurological complications (HCC)   Normocytic anemia   Inappropriate sinus tachycardia (HCC)   Obesity (BMI 30-39.9)  Resolved Problems:   * No resolved hospital problems. *  Hospital Course:  Mary Velasquez is a 43 y.o. female with medical history significant of type 2 diabetes, recent hysterectomy complicated by post-op anemia requiring blood transfusion, inappropriate sinus tachycardia, brain AVM, peripheral neuropathy, who presents to the ED due to dizziness and syncope.    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  When she came to, she was laying on the ground and the resident was yelling her name.  She denies any prodromal symptoms other than persistent dizziness prior to the syncope episode.  She notes in the past, when she had dizziness or syncope, was due to heavy menstrual cycles but she has not had any menstrual cycle since hysterectomy.  She endorses chronic lower extremity swelling that is unchanged and new abdominal swelling that has been occurring for the last few months.  She denies any orthopnea, shortness of breath, chest pain, palpitations.  She denies any recent illness.   ED Course:  On arrival to the ED, patient was normotensive at 127/91 with heart rate of 108. She was  saturating at 93% on room air. She was afebrile at 98. Initial work up notable for hemoglobin of 10.6, sodium 133, creatinine 112, glucose 162 GFR above 60.  Troponin 33 none 35.  D-dimer elevated 0.84.  CTA obtained with no evidence of PE, however Multichamber cardiomegaly with reflux of contrast into hepatic veins suggesting right heart dysfunction with patchy mosaic attenuation and groundglass opacities concerning for pulmonary edema.  CTA of the head was obtained with no evidence of acute intracranial abnormality.  Patient started on aspirin, IV fluids, meclizine and Zofran.  TRH contacted for admission.     Assessment and Plan:   * Syncope Likely secondary to arrhythmia in the setting of dilated cardiomyopathy Patient presented for evaluation of a 1 day history of dizziness with multiple falls and an episode of syncope at work.  2D echocardiogram showed an LVEF of 30 to 35% with moderately decreased LV systolic function and global hypokinesis.  Right ventricular systolic function is mildly reduced with moderately elevated pulmonary artery systolic pressure Appreciate cardiology input.  EP input appreciated Patient has been fitted with a Life vest and has been advised to use as recommended She has been placed on driving restrictions for 6 months      Elevated troponin Myocardial Injury On presentation, patient's troponins were noted to be mildly elevated indicating demand ischemia.   CTA was obtained to rule out PE and notable for concern of right heart dysfunction, findings suggestive of pulmonary hypertension and pulmonary edema.  Appreciate cardiology input, 2D echocardiogram shows a  reduced LVEF of 30 to 35% with LV global hypokinesis. Patient had a left and right heart cath which showed normal coronaries consistent with nonischemic cardiomyopathy Awaiting results of cardiac MRI, this will be followed up as an outpatient           Acute systolic CHF New onset nonischemic  cardiomyopathy Patient presented to the ER for evaluation after a witnessed syncopal episode at work CT angiogram showed multi chamber cardiomegaly with reflux of contrast material into the hepatic veins, suggesting a degree of right heart dysfunction. Patchy mosaic attenuation and diffuse peri bronchovascular ground-glass opacities, which may be seen in the setting of pulmonary edema. Small bilateral pleural effusions. Dilated main pulmonary artery, which can be seen in the setting of pulmonary hypertension. 2D echocardiogram showed an LVEF of 30 to 35% with moderately decreased LV function and LV global hypokinesis. Right heart cath showed mild to moderate WHO class II pulmonary hypertension IV Lasix has been discontinued due to contraction alkalosis and hypotension Continue metoprolol, spironolactone and losartan as blood pressure tolerates Patient not on an SGLT2 due to episode of hypoglycemia during this hospitalization Will be discharged home today on a LifeVest       Hypoxia Patient was noted to have room air pulse oximetry post ambulation of 85% and is currently on 2 L of oxygen to maintain pulse oximetry greater than 92%. Hypoxia appears to be secondary to acute systolic CHF Patient has been weaned off oxygen and will need a walk test prior to discharge       History of inappropriate sinus tachycardia Continue metoprolol     Normocytic anemia History of chronic anemia, initially due to chronic blood loss and then postoperative blood loss.   Hemoglobin appears stable at this time        DM (diabetes mellitus), type 2 with neurological complications [facility]) Patient had an episode of hypoglycemia 04/11 probably iatrogenic Continue long acting insulin and Metformin     Obesity,Class 1 Complicates overall prognosis and care. Lifestyle modification exercise has been discussed with patient in detail.             Consultants: Cardiology Procedures performed: Left and  right heart cath, 2D echocardiogram Disposition: Home Diet recommendation:  Discharge Diet Orders (From admission, onward)     Start     Ordered   10/12/23 0000  Diet - low sodium heart healthy        10/12/23 1050   10/12/23 0000  Diet Carb Modified        10/12/23 1050           Cardiac and Carb modified diet DISCHARGE MEDICATION: Allergies as of 10/12/2023       Reactions   Oxycodone Anaphylaxis, Swelling   Throat swelling   Oxycontin [oxycodone Hcl] Anaphylaxis, Swelling   Throat swelling        Medication List     STOP taking these medications    iron sucrose Commonly known as: VENOFER       TAKE these medications    Blood Glucose Monitoring Suppl Devi 1 each by Does not apply route 3 (three) times daily. May dispense any manufacturer covered by patient's insurance.   BLOOD GLUCOSE TEST STRIPS Strp 1 each by Does not apply route 3 (three) times daily. Use as directed to check blood sugar. May dispense any manufacturer covered by patient's insurance and fits patient's device.   ferrous sulfate 325 (65 FE) MG EC tablet Take 1 tablet (325 mg total) by  mouth 2 (two) times daily.   gabapentin 300 MG capsule Commonly known as: NEURONTIN Take 600 mg by mouth at bedtime.   insulin aspart 100 UNIT/ML FlexPen Commonly known as: NOVOLOG Inject 4 Units into the skin 3 (three) times daily with meals. Only take if eating a meal AND Blood Glucose (BG) is 80 or higher. What changed: how much to take   insulin glargine 100 UNIT/ML Solostar Pen Commonly known as: LANTUS Inject 14 Units into the skin at bedtime. May substitute as needed per insurance. What changed: how much to take   Lancet Device Misc 1 each by Does not apply route 3 (three) times daily. May dispense any manufacturer covered by patient's insurance.   Lancets Misc 1 each by Does not apply route 3 (three) times daily. Use as directed to check blood sugar. May dispense any manufacturer covered by  patient's insurance and fits patient's device.   losartan 25 MG tablet Commonly known as: COZAAR Take 0.5 tablets (12.5 mg total) by mouth daily. Start taking on: October 13, 2023   metFORMIN 1000 MG tablet Commonly known as: GLUCOPHAGE Take 1,000 mg by mouth 2 (two) times daily with a meal. Breakfast & supper   metoprolol succinate 25 MG 24 hr tablet Commonly known as: TOPROL-XL Take 1 tablet (25 mg total) by mouth daily. Start taking on: October 13, 2023   norethindrone-ethinyl estradiol 1/35 tablet Commonly known as: ORTHO-NOVUM Take 1 pill 3 times per day for 3 days, then 1 pill 2 times per day for 2 days, then 1 pill daily until you see your outpatient gynecologist.   nortriptyline 50 MG capsule Commonly known as: PAMELOR Take 100 mg by mouth at bedtime.   Pen Needles 31G X 5 MM Misc 1 each by Does not apply route 3 (three) times daily. May dispense any manufacturer covered by patient's insurance.   spironolactone 25 MG tablet Commonly known as: ALDACTONE Take 0.5 tablets (12.5 mg total) by mouth daily. Start taking on: October 13, 2023               Durable Medical Equipment  (From admission, onward)           Start     Ordered   10/10/23 1411  For home use only DME Vest life vest  Once       Comments: Length of use: 3 months   10/10/23 1410            Follow-up Information     [facility]. Go on 10/14/2023.   Specialty: Cardiology Why: Hospital Follow-Up 10/14/23 @ 3:30PM Please bring all medications to follow-up appointment [facility], [address] Floor Free Valet Parking at the door Contact information: [address] [place]  [phone]               Discharge Exam: Mary Velasquez Weights   10/10/23 0510 10/11/23 0500 10/12/23 0500  Weight: 77.4 kg 77.1 kg 77.5 kg   General: She is not in acute distress.    Appearance: She is obese. She is  not toxic-appearing.  HENT:     Head: Normocephalic and atraumatic.     Mouth/Throat:     Mouth: Mucous membranes are moist.     Pharynx: Oropharynx is clear.  Eyes:     Extraocular Movements: Extraocular movements intact.     Conjunctiva/sclera: Conjunctivae normal.  Cardiovascular:     Rate and Rhythm: Regular rate and rhythm  Heart sounds: No murmur heard.    No gallop.  Pulmonary:     Effort: Pulmonary effort is normal. No respiratory distress.     Breath sounds: Normal breath sounds. No wheezing, rhonchi or rales.  Abdominal:     General: Bowel sounds are normal.  Central adiposity    Palpations: Abdomen is soft.     Tenderness: There is no abdominal tenderness. There is no guarding.  Musculoskeletal:     Right lower leg: No edema.     Left lower leg: No edema.  Skin:    General: Skin is warm and dry.  Neurological:     Mental Status: She is alert.     Comments:  Psychiatric:        Mood and Affect: Mood normal.        Behavior: Behavior normal.     Condition at discharge: stable  The results of significant diagnostics from this hospitalization (including imaging, microbiology, ancillary and laboratory) are listed below for reference.   Imaging Studies: CARDIAC CATHETERIZATION Result Date: 10/09/2023   LV end diastolic pressure is severely elevated.   Hemodynamic findings consistent with mild pulmonary hypertension.   There is no aortic valve stenosis.   Anticipated discharge date to be determined.   Patient will be started on 40 mg IV twice daily Lasix.   Cardiac MRI ordered per advanced heart failure team-Dr. Julane Ny.   No indication for antiplatelet therapy at this time . 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 LV P-EDP 136/2-36 mmHg; AoP-MAP  134/88-108 mmHg. AO sat 88%, PA sat 57% on room air. Fick cardiac output-index 5.08-2.89. PVR 1 89D/S; index through 3 32D/SI  PLAN OF CARE:  Patient will return to nursing unit after TR band removal.  Will order 40 mg IV Lasix twice daily and cardiac MRI.   Patient will be started on 40 mg IV twice daily Lasix.   Cardiac MRI ordered per advanced heart failure team-Dr. Julane Ny.   No indication for antiplatelet therapy at this time . Mary Bustard, MD  ECHOCARDIOGRAM COMPLETE Result Date: 10/07/2023    ECHOCARDIOGRAM REPORT   Patient Name:   Mary Velasquez Date of Exam: 10/07/2023 Medical Rec #:  [id number]      Height:       60.0 in Accession #:    [id number]     Weight:       160.0 lb Date of Birth:  [birth year]      BSA:          1.698 m Patient Age:    42 years       BP:           107/76 mmHg Patient Gender: F              HR:           113 bpm. Exam Location:  [facility] Procedure: 2D Echo, 3D Echo, Cardiac Doppler, Color Doppler and Strain Analysis            (Both Spectral and Color Flow Doppler were utilized during            procedure). Indications:     Syncope  History:         Patient has prior history of Echocardiogram examinations, most  recent 11/21/2022. Signs/Symptoms:Syncope and Dyspnea; Risk                  Factors:Diabetes.  Sonographer:     Clarke Crouch Referring Phys:  [id number] Avi Body Diagnosing Phys: Sammy Crisp MD  Sonographer Comments: Image acquisition challenging due to respiratory motion. Global longitudinal strain was attempted. IMPRESSIONS  1. Left ventricular ejection fraction, by estimation, is 30 to 35%. The left ventricle has moderately decreased function. The left ventricle demonstrates global hypokinesis. There is mild left ventricular hypertrophy. Indeterminate diastolic filling due  to E-A fusion. The average left ventricular global longitudinal strain is -6.6 %. The global longitudinal strain is abnormal.  2. Right ventricular systolic function is mildly  reduced. The right ventricular size is normal. There is moderately elevated pulmonary artery systolic pressure.  3. The mitral valve is normal in structure. Moderate mitral valve regurgitation. No evidence of mitral stenosis.  4. Tricuspid valve regurgitation is mild to moderate.  5. The aortic valve is tricuspid. Aortic valve regurgitation is mild.  6. The inferior vena cava is dilated in size with <50% respiratory variability, suggesting right atrial pressure of 15 mmHg. Comparison(s): A prior study was performed on 11/21/2022. The left ventricular function is significantly worse. FINDINGS  Left Ventricle: Left ventricular ejection fraction, by estimation, is 30 to 35%. The left ventricle has moderately decreased function. The left ventricle demonstrates global hypokinesis. The average left ventricular global longitudinal strain is -6.6 %.  Strain was performed and the global longitudinal strain is abnormal. 3D ejection fraction reviewed and evaluated as part of the interpretation. Alternate measurement of EF is felt to be most reflective of LV function. The left ventricular internal cavity size was normal in size. There is mild left ventricular hypertrophy. Indeterminate diastolic filling due to E-A fusion. Right Ventricle: The right ventricular size is normal. No increase in right ventricular wall thickness. Right ventricular systolic function is mildly reduced. There is moderately elevated pulmonary artery systolic pressure. The tricuspid regurgitant velocity is 2.98 m/s, and with an assumed right atrial pressure of 15 mmHg, the estimated right ventricular systolic pressure is 50.5 mmHg. Left Atrium: Left atrial size was normal in size. Right Atrium: Right atrial size was normal in size. Pericardium: Trivial pericardial effusion is present. Mitral Valve: The mitral valve is normal in structure. Moderate mitral valve regurgitation. No evidence of mitral valve stenosis. MV peak gradient, 6.2 mmHg. The mean mitral  valve gradient is 3.0 mmHg. Tricuspid Valve: The tricuspid valve is normal in structure. Tricuspid valve regurgitation is mild to moderate. Aortic Valve: The aortic valve is tricuspid. Aortic valve regurgitation is mild. Aortic valve mean gradient measures 3.0 mmHg. Aortic valve peak gradient measures 6.1 mmHg. Aortic valve area, by VTI measures 2.28 cm. Pulmonic Valve: The pulmonic valve was not well visualized. Pulmonic valve regurgitation is mild. No evidence of pulmonic stenosis. Aorta: The aortic root is normal in size and structure. Pulmonary Artery: The pulmonary artery is of normal size. Venous: The inferior vena cava is dilated in size with less than 50% respiratory variability, suggesting right atrial pressure of 15 mmHg. IAS/Shunts: No atrial level shunt detected by color flow Doppler.  LEFT VENTRICLE PLAX 2D LVIDd:         4.80 cm LVIDs:         3.50 cm     2D Longitudinal Strain LV PW:         1.40 cm     2D Strain GLS Avg:     -6.6 %  LV IVS:        1.20 cm LVOT diam:     1.90 cm LV SV:         49 LV SV Index:   29 LVOT Area:     2.84 cm  LV Volumes (MOD) LV vol d, MOD A2C: 60.9 ml LV vol d, MOD A4C: 57.2 ml LV vol s, MOD A2C: 38.6 ml LV vol s, MOD A4C: 36.7 ml LV SV MOD A2C:     22.3 ml LV SV MOD A4C:     57.2 ml LV SV MOD BP:      21.9 ml RIGHT VENTRICLE RV Basal diam:  3.30 cm RV Mid diam:    2.30 cm RV S prime:     8.45 cm/s TAPSE (M-mode): 1.6 cm LEFT ATRIUM             Index        RIGHT ATRIUM           Index LA diam:        4.10 cm 2.41 cm/m   RA Area:     10.00 cm LA Vol (A2C):   43.5 ml 25.62 ml/m  RA Volume:   16.70 ml  9.84 ml/m LA Vol (A4C):   47.3 ml 27.86 ml/m LA Biplane Vol: 48.6 ml 28.63 ml/m  AORTIC VALVE                    PULMONIC VALVE AV Area (Vmax):    2.20 cm     PV Vmax:          1.19 m/s AV Area (Vmean):   1.83 cm     PV Peak grad:     5.7 mmHg AV Area (VTI):     2.28 cm     PR End Diast Vel: 15.21 msec AV Vmax:           123.50 cm/s AV Vmean:          79.950 cm/s AV  VTI:            0.214 m AV Peak Grad:      6.1 mmHg AV Mean Grad:      3.0 mmHg LVOT Vmax:         95.80 cm/s LVOT Vmean:        51.600 cm/s LVOT VTI:          0.172 m LVOT/AV VTI ratio: 0.80  AORTA Ao Root diam: 2.70 cm MITRAL VALVE                TRICUSPID VALVE MV Area (PHT): 5.62 cm     TR Peak grad:   35.5 mmHg MV Area VTI:   2.76 cm     TR Vmax:        298.00 cm/s MV Peak grad:  6.2 mmHg MV Mean grad:  3.0 mmHg     SHUNTS MV Vmax:       1.25 m/s     Systemic VTI:  0.17 m MV Vmean:      75.2 cm/s    Systemic Diam: 1.90 cm MV Decel Time: 135 msec MV E velocity: 105.00 cm/s Sammy Crisp MD Electronically signed by Sammy Crisp MD Signature Date/Time: 10/07/2023/1:03:11 PM    Final    MR BRAIN WO CONTRAST Result Date: 10/06/2023 CLINICAL DATA:  Syncope.  Dizziness.  Ataxia. EXAM: MRI HEAD WITHOUT CONTRAST TECHNIQUE: Multiplanar, multiecho pulse sequences of the brain and surrounding structures were obtained without  intravenous contrast. COMPARISON:  CTA head 10/06/2023.  MRI head 03/29/2022. FINDINGS: Brain: There is no evidence of an acute infarct, intracranial hemorrhage, mass, midline shift, or extra-axial fluid collection. A partially empty sella is unchanged. Cerebral volume is normal. The ventricles are normal in size. 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 No significant cerebral white matter disease is evident. Vascular: Major intracranial vascular flow voids are preserved. 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 Skull and upper cervical spine: Diffusely diminished bone marrow T1 signal intensity, chronic and nonspecific but can be seen with anemia, smoking, and obesity. Sinuses/Orbits: Unremarkable orbits. Chronic right maxillary sinusitis. Trace fluid or mucosal thickening in the mastoid air cells bilaterally. Other: None. IMPRESSION: 1. No acute intracranial abnormality. 2. Small chronic cerebellar infarcts. 3. Partially empty sella, often  reflecting normal anatomic variation though can also be seen with idiopathic intracranial hypertension. Electronically Signed   By: Aundra Lee M.D.   On: 10/06/2023 17:02   CT Angio Chest PE W and/or Wo Contrast Result Date: 10/06/2023 CLINICAL DATA:  Syncope and elevated D-dimer EXAM: CT ANGIOGRAPHY CHEST WITH CONTRAST TECHNIQUE: Multidetector CT imaging of the chest was performed using the standard protocol during bolus administration of intravenous contrast. Multiplanar CT image reconstructions and MIPs were obtained to evaluate the vascular anatomy. RADIATION DOSE REDUCTION: This exam was performed according to the departmental dose-optimization program which includes automated exposure control, adjustment of the mA and/or kV according to patient size and/or use of iterative reconstruction technique. CONTRAST:  100mL OMNIPAQUE IOHEXOL 350 MG/ML SOLN COMPARISON:  None Available. FINDINGS: Cardiovascular: The study is high quality for the evaluation of pulmonary embolism. There are no filling defects in the central, lobar, segmental or subsegmental pulmonary artery branches to suggest acute pulmonary embolism. Dilated main pulmonary artery measures 3.2 cm. Multichamber cardiomegaly. No significant pericardial fluid/thickening. Reflux of contrast material into the hepatic veins, suggesting a degree of right heart dysfunction. Mediastinum/Nodes: Imaged thyroid gland without nodules meeting criteria for imaging follow-up by size. Mildly patulous esophagus. 10 mm pretracheal lymph nodes (7:26, 35). Lungs/Pleura: The central airways are patent. Patchy mosaic attenuation and diffuse peribronchovascular ground-glass opacities. No pneumothorax. Small bilateral pleural. Upper abdomen: Normal. Musculoskeletal: No acute or abnormal lytic or blastic osseous lesions. Review of the MIP images confirms the above findings. IMPRESSION: 1. No evidence of pulmonary embolism. 2. Multichamber cardiomegaly with reflux of contrast  material into the hepatic veins, suggesting a degree of right heart dysfunction. 3. Patchy mosaic attenuation and diffuse peribronchovascular ground-glass opacities, which may be seen in the setting of pulmonary edema. 4. Small bilateral pleural effusions. 5. Dilated main pulmonary artery, which can be seen in the setting of pulmonary hypertension. Electronically Signed   By: Limin  Xu M.D.   On: 10/06/2023 13:04   CT Angio Head W or Wo Contrast Result Date: 10/06/2023 CLINICAL DATA:  43 year old female with dizziness and ataxia. Struck head with loss of consciousness. EXAM: CT ANGIOGRAPHY HEAD WITH AND WITHOUT CONTRAST TECHNIQUE: Multidetector CT imaging of the head was performed using the standard protocol during bolus administration of intravenous contrast. Multiplanar CT image reconstructions and MIPs were obtained to evaluate the vascular anatomy. Carotid stenosis measurements (when applicable) are obtained utilizing NASCET criteria, using the distal internal carotid diameter as the denominator. RADIATION DOSE REDUCTION: This exam was performed according to the departmental dose-optimization program which includes automated exposure control, adjustment of the mA and/or kV according to patient size and/or use of iterative reconstruction technique. CONTRAST:  OMNIPAQUE IOHEXOL 350  MG/ML SOLN COMPARISON:  Head CT 01/15/2023.  Brain MRI 03/29/2022. FINDINGS: CT HEAD Brain: Stable cerebral volume. No midline shift, ventriculomegaly, mass effect, evidence of mass lesion, intracranial hemorrhage or evidence of cortically based acute infarction. Gray-white matter differentiation is within normal limits throughout the brain. Calvarium and skull base: Intact. No acute osseous abnormality identified. Paranasal sinuses: Progressed right maxillary sinus disease since last year, total visible sinus opacification there now. New right ethmoid sinus mucosal thickening. Other Visualized paranasal sinuses and mastoids are  stable and well aerated. Orbits: No acute orbit or scalp soft tissue finding. CTA HEAD Posterior circulation: Distal vertebral arteries appear codominant and patent to the vertebrobasilar junction. PICA origins are patent. No distal vertebral plaque or stenosis identified. Patent although diminutive basilar artery. No basilar stenosis. SCA and right PCA origins are patent. Fetal type left PCA origin. Right posterior communicating artery is also present. Bilateral PCA branches are within normal limits. Anterior circulation: Distal cervical ICAs, both ICA siphons are patent. No siphon plaque or stenosis is identified. Normal posterior communicating artery origins. Patent carotid termini. Patent MCA and ACA origins. Dominant right A1. Normal anterior communicating artery. Diminutive left A1. Bilateral ACA branches are within normal limits. Left MCA M1 segment and bifurcation are patent without stenosis. Right MCA M1 segment and bifurcation are patent without stenosis. Bilateral MCA branches are within normal limits. Venous sinuses: Early contrast timing, not well evaluated. Anatomic variants: Posterior circulation including fetal type left PCA origin, highly dominant right ACA A1. Review of the MIP images confirms the above findings IMPRESSION: 1. Negative CTA Head.  Stable and normal CT appearance of the brain. 2. Progressed right maxillary and ethmoid sinus disease since last year. Electronically Signed   By: Marlise Simpers M.D.   On: 10/06/2023 12:25    Microbiology: Results for orders placed or performed during the hospital encounter of 08/26/22  Resp panel by RT-PCR (RSV, Flu A&B, Covid) Anterior Nasal Swab     Status: None   Collection Time: 08/26/22  3:22 AM   Specimen: Anterior Nasal Swab  Result Value Ref Range Status   SARS Coronavirus 2 by RT PCR NEGATIVE NEGATIVE Final    Comment: (NOTE) SARS-CoV-2 target nucleic acids are NOT DETECTED.  The SARS-CoV-2 RNA is generally detectable in upper  respiratory specimens during the acute phase of infection. The lowest concentration of SARS-CoV-2 viral copies this assay can detect is 138 copies/mL. A negative result does not preclude SARS-Cov-2 infection and should not be used as the sole basis for treatment or other patient management decisions. A negative result may occur with  improper specimen collection/handling, submission of specimen other than nasopharyngeal swab, presence of viral mutation(s) within the areas targeted by this assay, and inadequate number of viral copies(<138 copies/mL). A negative result must be combined with clinical observations, patient history, and epidemiological information. The expected result is Negative.  Fact Sheet for Patients:  BloggerCourse.com  Fact Sheet for Healthcare Providers:  SeriousBroker.it  This test is no t yet approved or cleared by the [place]  FDA and  has been authorized for detection and/or diagnosis of SARS-CoV-2 by FDA under an Emergency Use Authorization (EUA). This EUA will remain  in effect (meaning this test can be used) for the duration of the COVID-19 declaration under Section 564(b)(1) of the Act, 21 U.S.C.section 360bbb-3(b)(1), unless the authorization is terminated  or revoked sooner.       Influenza A by PCR NEGATIVE NEGATIVE Final   Influenza B by PCR NEGATIVE NEGATIVE  Final    Comment: (NOTE) The Xpert Xpress SARS-CoV-2/FLU/RSV plus assay is intended as an aid in the diagnosis of influenza from Nasopharyngeal swab specimens and should not be used as a sole basis for treatment. Nasal washings and aspirates are unacceptable for Xpert Xpress SARS-CoV-2/FLU/RSV testing.  Fact Sheet for Patients: BloggerCourse.com  Fact Sheet for Healthcare Providers: SeriousBroker.it  This test is not yet approved or cleared by the [place]  FDA and has been  authorized for detection and/or diagnosis of SARS-CoV-2 by FDA under an Emergency Use Authorization (EUA). This EUA will remain in effect (meaning this test can be used) for the duration of the COVID-19 declaration under Section 564(b)(1) of the Act, 21 U.S.C. section 360bbb-3(b)(1), unless the authorization is terminated or revoked.     Resp Syncytial Virus by PCR NEGATIVE NEGATIVE Final    Comment: (NOTE) Fact Sheet for Patients: BloggerCourse.com  Fact Sheet for Healthcare Providers: SeriousBroker.it  This test is not yet approved or cleared by the [place]  FDA and has been authorized for detection and/or diagnosis of SARS-CoV-2 by FDA under an Emergency Use Authorization (EUA). This EUA will remain in effect (meaning this test can be used) for the duration of the COVID-19 declaration under Section 564(b)(1) of the Act, 21 U.S.C. section 360bbb-3(b)(1), unless the authorization is terminated or revoked.  Performed at [facility], [address] [place], [place] [address]     Labs: CBC: Recent Labs  Lab 10/06/23 1013 10/07/23 0547 10/09/23 1502 10/09/23 1507 10/09/23 1508 10/09/23 1840  WBC 9.0 6.8  --   --   --  6.9  NEUTROABS 7.4 4.6  --   --   --   --   HGB 10.6* 9.9* 10.9* 10.9* 10.9* 11.1*  HCT 33.1* 30.2* 32.0* 32.0* 32.0* 33.9*  MCV 86.6 84.6  --   --   --  86.7  PLT 299 296  --   --   --  333   Basic Metabolic Panel: Recent Labs  Lab 10/08/23 0507 10/09/23 0919 10/09/23 1502 10/09/23 1507 10/09/23 1508 10/09/23 1840 10/09/23 2239 10/10/23 0431 10/11/23 0336 10/12/23 0600  NA 137 137   < > 142 142  --   --  138 138 138  K 3.6 3.5   < > 3.4* 3.4*  --   --  2.8* 4.6 4.7  CL 102 99  --   --   --   --   --  94* 101 102  CO2 30 29  --   --   --   --   --  33* 31 29  GLUCOSE 116* 157*  --   --   --   --  360* 106* 225* 209*  BUN 12 14  --   --   --   --   --  15 18 19   CREATININE 0.72  0.74  --   --   --  0.86  --  0.89 0.84 0.88  CALCIUM 7.9* 7.7*  --   --   --   --   --  8.0* 8.4* 9.3  MG  --   --   --   --   --   --   --  1.2* 2.3  --    < > = values in this interval not displayed.   Liver Function Tests: No results for input(s): "AST", "ALT", "ALKPHOS", "BILITOT", "PROT", "ALBUMIN" in the last 168 hours. CBG: Recent Labs  Lab 10/11/23 1659 10/11/23 2050  10/11/23 2334 10/12/23 0415 10/12/23 0732  GLUCAP 132* 190* 229* 216* 180*    Discharge time spent: greater than 30 minutes.  Signed: Read Camel, MD [facility] 10/12/2023

## 2023-10-12 NOTE — Progress Notes (Signed)
 Progress Note  Patient Name: Mary Velasquez Date of Encounter: 10/12/2023  Primary Cardiologist: Agbor-Etang  Subjective   No chest pain, dyspnea, palpitations, dizziness, presyncope, or syncope.  Hemodynamically stable.  Fit for LifeVest yesterday.  Cardiac MRI overread pending.  Has ambulated around the nurses station without issues.   Inpatient Medications    Scheduled Meds:  enoxaparin (LOVENOX) injection  40 mg Subcutaneous Q24H   ferrous sulfate  325 mg Oral Q breakfast   gabapentin  600 mg Oral QHS   insulin aspart  0-9 Units Subcutaneous TID WC   insulin glargine-yfgn  4 Units Subcutaneous QHS   losartan  12.5 mg Oral Daily   metoprolol succinate  25 mg Oral Daily   nortriptyline  100 mg Oral QHS   sodium chloride flush  3 mL Intravenous Q12H   sodium chloride flush  3 mL Intravenous Q12H   spironolactone  12.5 mg Oral Daily   Continuous Infusions:  PRN Meds: acetaminophen **OR** acetaminophen, ondansetron **OR** ondansetron (ZOFRAN) IV, polyethylene glycol, sodium chloride flush   Vital Signs    Vitals:   10/11/23 2335 10/12/23 0415 10/12/23 0500 10/12/23 0731  BP: (!) 127/96 113/85  116/87  Pulse: (!) 105 96  95  Resp: 20 18    Temp: 98.1 F (36.7 C) 98.3 F (36.8 C)  98 F (36.7 C)  TempSrc: Oral Oral    SpO2: 96% 94%  94%  Weight:   77.5 kg   Height:        Intake/Output Summary (Last 24 hours) at 10/12/2023 0759 Last data filed at 10/11/2023 1500 Gross per 24 hour  Intake 720 ml  Output --  Net 720 ml   Filed Weights   10/10/23 0510 10/11/23 0500 10/12/23 0500  Weight: 77.4 kg 77.1 kg 77.5 kg    Telemetry    Sinus rhythm with sinus tachycardia, 90s to low 100s bpm - Personally Reviewed  ECG    No new tracings - Personally Reviewed  Physical Exam   GEN: No acute distress.   Neck: No JVD. Cardiac: RRR, no murmurs, rubs, or gallops.  Respiratory: Clear to auscultation bilaterally.  GI: Soft, nontender, non-distended.   MS: No  edema; No deformity. Neuro:  Alert and oriented x 3; Nonfocal.  Psych: Normal affect.  Labs    Chemistry Recent Labs  Lab 10/10/23 0431 10/11/23 0336 10/12/23 0600  NA 138 138 138  K 2.8* 4.6 4.7  CL 94* 101 102  CO2 33* 31 29  GLUCOSE 106* 225* 209*  BUN 15 18 19   CREATININE 0.89 0.84 0.88  CALCIUM 8.0* 8.4* 9.3  GFRNONAA >60 >60 >60  ANIONGAP 11 6 7      Hematology Recent Labs  Lab 10/06/23 1013 10/07/23 0547 10/09/23 1502 10/09/23 1507 10/09/23 1508 10/09/23 1840  WBC 9.0 6.8  --   --   --  6.9  RBC 3.82* 3.57*  --   --   --  3.91  HGB 10.6* 9.9*   < > 10.9* 10.9* 11.1*  HCT 33.1* 30.2*   < > 32.0* 32.0* 33.9*  MCV 86.6 84.6  --   --   --  86.7  MCH 27.7 27.7  --   --   --  28.4  MCHC 32.0 32.8  --   --   --  32.7  RDW 13.5 13.7  --   --   --  13.6  PLT 299 296  --   --   --  333   < > =  values in this interval not displayed.    Cardiac EnzymesNo results for input(s): "TROPONINI" in the last 168 hours. No results for input(s): "TROPIPOC" in the last 168 hours.   BNP Recent Labs  Lab 10/06/23 1013  BNP 235.0*     DDimer  Recent Labs  Lab 10/06/23 1013  DDIMER 0.84*     Radiology      Cardiac Studies   2D echo 11/21/2022: 1. Left ventricular ejection fraction, by estimation, is 60 to 65%. The  left ventricle has normal function. The left ventricle has no regional  wall motion abnormalities. There is mild left ventricular hypertrophy.  Left ventricular diastolic parameters  are consistent with Grade I diastolic dysfunction (impaired relaxation).   2. Right ventricular systolic function is normal. The right ventricular  size is normal.   3. A small pericardial effusion is present. There is no evidence of  cardiac tamponade.   4. The mitral valve is normal in structure. No evidence of mitral valve  regurgitation. No evidence of mitral stenosis.   5. The aortic valve is tricuspid. Aortic valve regurgitation is not  visualized. No aortic  stenosis is present.   6. The inferior vena cava is normal in size with greater than 50%  respiratory variability, suggesting right atrial pressure of 3 mmHg.  __________  2D echo 10/07/2023: 1. Left ventricular ejection fraction, by estimation, is 30 to 35%. The  left ventricle has moderately decreased function. The left ventricle  demonstrates global hypokinesis. There is mild left ventricular  hypertrophy. Indeterminate diastolic filling due   to E-A fusion. The average left ventricular global longitudinal strain is  -6.6 %. The global longitudinal strain is abnormal.   2. Right ventricular systolic function is mildly reduced. The right  ventricular size is normal. There is moderately elevated pulmonary artery  systolic pressure.   3. The mitral valve is normal in structure. Moderate mitral valve  regurgitation. No evidence of mitral stenosis.   4. Tricuspid valve regurgitation is mild to moderate.   5. The aortic valve is tricuspid. Aortic valve regurgitation is mild.   6. The inferior vena cava is dilated in size with <50% respiratory  variability, suggesting right atrial pressure of 15 mmHg.   Comparison(s): A prior study was performed on 11/21/2022. The left  ventricular function is significantly worse.  __________  [facility] 10/09/2023:   LV end diastolic pressure is severely elevated.   Hemodynamic findings consistent with mild pulmonary hypertension.   There is no aortic valve stenosis.   Anticipated discharge date to be determined.   Patient will be started on 40 mg IV twice daily Lasix.   Cardiac MRI ordered per advanced heart failure team-Dr. Julane Ny.   No indication for antiplatelet therapy at this time .     POST-OPERATIVE DIAGNOSIS: Angiographically normal coronary arteries with a right dominant system  Mild to moderate WHO Class II Pulm Hypertension: RAP mean 10 mmHg, RV P-EDP 42/6-21 mmHg PAP-mean 43/21-32 millimercury; PCWP mean more consistent with 28 mmHg. LV  P-EDP 136/2-36 mmHg; AoP-MAP 134/88-108 mmHg. AO sat 88%, PA sat 57% on room air. Fick cardiac output-index 5.08-2.89. PVR 1 89D/S; index through 3 32D/SI     PLAN OF CARE:  Patient will return to nursing unit after TR band removal.  Will order 40 mg IV Lasix twice daily and cardiac MRI.   Patient will be started on 40 mg IV twice daily Lasix.   Cardiac MRI ordered per advanced heart failure team-Dr. Julane Ny.   No  indication for antiplatelet therapy at this time . __________  cMRI pending   Assessment & Plan    1. Syncope - High suspicion for arrhythmogenic cause in setting of reduced EF, sudden onset and family h/o SCD. However ECG and tele so far unremarkable. - Query related to her blood sugars - Cath with no CAD - cMRI pending  - Evaluated by EP with further concern for arrhythmogenic etiology  - No evidence of arrhythmia, prolonged pause, or high-grade AV block on telemetry  - Has been fit for LifeVest (device in room)  - Consider ILR prior to discharge vs Zio AT (would likely interfere with LifeVest fitting/leads), will defer to EP - On Toprol  - No driving for 6 months from date of last syncopal episode per [place] DMV restrictions    2. Acute systolic HF - Echo EF 30-35% - Cath no CAD - Volume status improved with IV diuresis, now off  - Now with contraction alkalosis. Stop lasix - BP stable, received one-dose of midodrine, now off - Continue spiro 12.5 - Continue losartan  - Continue Toprol - Will not add SGLT2i right now with hypoglycemic event overnight - Following optimization of GDMT if her EF does not improve, will need to consider ICD per EP, pending A1c trend (improving from 15.4 to 10.4 from 12/2022 to this admission)   4. DM2 - Per primary - Last A1c improving from 15.4 in 12/2022 to 10.4 this admission    5. Hypokalemia/hypomag - Repleted    Await cMRI results and EP recs prior to further disposition       For questions or updates, please contact [facility] Please consult [email or web address] for contact info under Cardiology/STEMI.    Signed, Varney Gentleman, PA-C [facility] Pager: [phone] 10/12/2023, 7:59 AM

## 2023-10-13 ENCOUNTER — Other Ambulatory Visit: Payer: Self-pay

## 2023-10-13 ENCOUNTER — Telehealth: Payer: Self-pay | Admitting: Family

## 2023-10-13 NOTE — Telephone Encounter (Incomplete)
 Called to confirm/remind patient of their appointment at the [facility] on 10/14/23.   Appointment:   [x] Confirmed  [] Left mess   [] No answer/No voice mail  [] Phone not in service  Patient reminded to bring all medications and/or complete list.  Confirmed patient has transportation. Gave directions, instructed to utilize valet parking.

## 2023-10-13 NOTE — Progress Notes (Unsigned)
 Advanced [facility] Clinic Note   Referring Physician: recent admission PCP: Bruce Caper PA @ [facility] (to be seen 05/25) Cardiologist: Sammy Crisp, MD   Chief Complaint: fatigue  HPI:  Ms Mary Velasquez is a 43 y/o female with a history of type 2 diabetes, recent hysterectomy complicated by post-op anemia requiring blood transfusion, inappropriate sinus tachycardia, brain AVM, obesity, anemia, peripheral neuropathy and chronic heart failure. Has paternal first cousin who died suddenly in her 30s from cardiac arrest.    Echo 10/2022 showed EF 60-65% without RWMA, mild LVH, G1DD, normal RV systolic function and sive, small pericardial effusion present, with RA pressure 3 mmHg.   Admitted 10/06/23 with dizziness and syncope. 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 When she came to, she was laying on the ground and the resident was yelling her name. BNP was 235, HS-troponin was 35. CTA chest was negative for PE, but showed cardiomegaly, pulmonary edema, small bilateral pleural effusions, and dilated pulmonary artery. 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 Echocardiogram 10/07/23: LVEF 30-35%, mild LVH, GLS of -6.6%, RV function mildly reduced with moderately elevate PA systolic pressure, moderate MR, mild AR, moderate TR. EP consulted. Right heart cath 10/09/23: showed mild to moderate WHO class II pulmonary hypertension. IV Lasix has been discontinued due to contraction alkalosis and hypotension. Lifevest placed prior to discharge.   cMRI 10/10/23:  IMPRESSION: 1. Normal LV size, moderately reduced LV systolic function. LVEF 35%.  2.  No LGE or scar.  3.  No evidence for infiltrative or inflammatory disease.  4.  Mildly reduced RV systolic function.  5.  No significant valvular abnormalities.  6.  Findings suggest non ischemic cardiomyopathy  She presents today, with her daughters, for her initial HF visit with a  chief complaint of minimal fatigue. Has associated pedal edema along with this. Denies shortness of breath, chest pain, abdominal distention, dizziness or difficulty sleeping. Her daughters do say that patient snores. Patient says that she had a sleep study >10 years ago but has never worn CPAP. Continues to wear LifeVest but has not had any shocks.    Weighing daily. Does add salt to her food. Drinking 4 bottles of water daily. Denies tobacco, drug use, rare alcohol use. Has not taken losartan, spironolactone or metoprolol for the last 2 days because her RX's were sent to [facility]. She plans to go over there today to get her medications.   Review of Systems: [y] = yes, [ ]  = no   General: Weight gain [ ] ; Weight loss [ ] ; Anorexia [ ] ; Fatigue [ y]; Fever [ ] ; Chills [ ] ; Weakness [ ]   Cardiac: Chest pain/pressure [ ] ; Resting SOB [ ] ; Exertional SOB [ ] ; Orthopnea [ ] ; Pedal Edema Davis.Dad ]; Palpitations [ ] ; Syncope [ ] ; Presyncope [ ] ; Paroxysmal nocturnal dyspnea[ ]   Pulmonary: Cough [ ] ; Wheezing[ ] ; Hemoptysis[ ] ; Sputum [ ] ; Snoring [ ]   GI: Vomiting[ ] ; Dysphagia[ ] ; Melena[ ] ; Hematochezia [ ] ; Heartburn[ ] ; Abdominal pain [ ] ; Constipation [ ] ; Diarrhea [ ] ; BRBPR [ ]   GU: Hematuria[ ] ; Dysuria [ ] ; Nocturia[ ]   Vascular: Pain in legs with walking [ ] ; Pain in feet with lying flat [ ] ; Non-healing sores [ ] ; Stroke [ ] ; TIA [ ] ; Slurred speech [ ] ;  Neuro: Headaches[ ] ; Vertigo[ ] ; Seizures[ ] ; Paresthesias[ ] ;Blurred vision [ ] ; Diplopia [ ] ; Vision changes [ ]   Ortho/Skin: Arthritis [ ] ; Joint pain [ ] ; Muscle pain [ ] ; Joint swelling [ ] ; Back Pain [ ] ; Rash [ ]   Psych: Depression[ ] ; Anxiety[ ]   Heme: Bleeding problems [ ] ; Clotting disorders [ ] ; Anemia [ y]  Endocrine: Diabetes Davis.Dad ]; Thyroid dysfunction[ ]    Past Medical History:  Diagnosis Date   Anemia    AVM (arteriovenous malformation)    Diabetes mellitus without complication (HCC)    History of kidney stones     Peripheral neuropathy    Pneumonia    Tachycardia     Current Outpatient Medications  Medication Sig Dispense Refill   Blood Glucose Monitoring Suppl DEVI 1 each by Does not apply route 3 (three) times daily. May dispense any manufacturer covered by patient's insurance. 1 each 0   ferrous sulfate 325 (65 FE) MG EC tablet Take 1 tablet (325 mg total) by mouth 2 (two) times daily. 60 tablet 3   gabapentin (NEURONTIN) 300 MG capsule Take 600 mg by mouth at bedtime.     Glucose Blood (BLOOD GLUCOSE TEST STRIPS) STRP 1 each by Does not apply route 3 (three) times daily. Use as directed to check blood sugar. May dispense any manufacturer covered by patient's insurance and fits patient's device. 100 strip 0   insulin aspart (NOVOLOG) 100 UNIT/ML FlexPen Inject 4 Units into the skin 3 (three) times daily with meals. Only take if eating a meal AND Blood Glucose (BG) is 80 or higher. (Patient taking differently: Inject 8 Units into the skin 3 (three) times daily with meals. Only take if eating a meal AND Blood Glucose (BG) is 80 or higher.) 3 mL 2   insulin glargine (LANTUS) 100 UNIT/ML Solostar Pen Inject 14 Units into the skin at bedtime. May substitute as needed per insurance. (Patient taking differently: Inject 18-50 Units into the skin at bedtime. May substitute as needed per insurance.) 4.2 mL 2   Insulin Pen Needle (PEN NEEDLES) 31G X 5 MM MISC 1 each by Does not apply route 3 (three) times daily. May dispense any manufacturer covered by patient's insurance. 100 each 0   Lancet Device MISC 1 each by Does not apply route 3 (three) times daily. May dispense any manufacturer covered by patient's insurance. 1 each 0   Lancets MISC 1 each by Does not apply route 3 (three) times daily. Use as directed to check blood sugar. May dispense any manufacturer covered by patient's insurance and fits patient's device. 100 each 0   losartan (COZAAR) 25 MG tablet Take 0.5 tablets (12.5 mg total) by mouth daily. 30  tablet 0   metFORMIN (GLUCOPHAGE) 1000 MG tablet Take 1,000 mg by mouth 2 (two) times daily with a meal. Breakfast & supper     metoprolol succinate (TOPROL-XL) 25 MG 24 hr tablet Take 1 tablet (25 mg total) by mouth daily. 30 tablet 0   norethindrone-ethinyl estradiol 1/35 (ORTHO-NOVUM) tablet Take 1 pill 3 times per day for 3 days, then 1 pill 2 times per day for 2 days, then 1 pill daily until you see your outpatient gynecologist. 30 tablet 2   nortriptyline (PAMELOR) 50 MG capsule Take 100 mg by mouth at bedtime.     spironolactone (ALDACTONE) 25 MG tablet Take 0.5 tablets (12.5 mg total) by mouth daily. 15 tablet 0   No current facility-administered medications for this visit.    Allergies  Allergen Reactions   Oxycodone Anaphylaxis and Swelling    Throat swelling   Oxycontin [Oxycodone Hcl]  Anaphylaxis and Swelling    Throat swelling       Social History   Socioeconomic History   Marital status: Single    Spouse name: Not on file   Number of children: 2   Years of education: Not on file   Highest education level: Some college, no degree  Occupational History   Not on file  Tobacco Use   Smoking status: Never   Smokeless tobacco: Never  Vaping Use   Vaping status: Never Used  Substance and Sexual Activity   Alcohol use: No   Drug use: Not Currently   Sexual activity: Not on file  Other Topics Concern   Not on file  Social History Narrative   Lives alone   Social Drivers of Health   Financial Resource Strain: High Risk (10/09/2023)   Overall Financial Resource Strain (CARDIA)    Difficulty of Paying Living Expenses: Hard  Food Insecurity: No Food Insecurity (10/09/2023)   Hunger Vital Sign    Worried About Running Out of Food in the Last Year: Never true    Ran Out of Food in the Last Year: Never true  Transportation Needs: No Transportation Needs (10/09/2023)   PRAPARE - Administrator, Civil Service (Medical): No    Lack of Transportation  (Non-Medical): No  Physical Activity: Not on file  Stress: Not on file  Social Connections: Not on file  Intimate Partner Violence: Not At Risk (10/07/2023)   Humiliation, Afraid, Rape, and Kick questionnaire    Fear of Current or Ex-Partner: No    Emotionally Abused: No    Physically Abused: No    Sexually Abused: No      Family History  Problem Relation Age of Onset   Heart disease Paternal Aunt    Heart attack Maternal Grandfather    Heart disease Maternal Grandfather    Heart attack Cousin    Heart disease Cousin    CAD Neg Hx    Seizures Neg Hx    Vitals:   10/14/23 1545  BP: (!) 139/94  Pulse: (!) 112  SpO2: 100%  Weight: 174 lb 2 oz (79 kg)   Wt Readings from Last 3 Encounters:  10/14/23 174 lb 2 oz (79 kg)  10/12/23 170 lb 13.7 oz (77.5 kg)  08/21/23 179 lb 14.3 oz (81.6 kg)   Lab Results  Component Value Date   CREATININE 0.88 10/12/2023   CREATININE 0.84 10/11/2023   CREATININE 0.89 10/10/2023    PHYSICAL EXAM:  General: Well appearing. No resp difficulty HEENT: normal Neck: supple, no JVD Cor: Regular rhythm, tachycardic. No rubs, gallops or murmurs Lungs: clear Abdomen: soft, nontender, nondistended. Extremities: no cyanosis, clubbing, rash,trace pitting edema around ankles Neuro: alert & oriented X 3. Moves all 4 extremities w/o difficulty. Affect pleasant   ECG: not done   ASSESSMENT & PLAN:  1: NICM with reduced ejection fraction- - unknown etiology at this time; cath without CAD, cMRI without amyloid; consider genetic cause - NYHA class II - euvolemic - weighing daily; discussed parameters to call about - Echo 10/07/23: LVEF 30-35%, mild LVH, GLS of -6.6%, RV function mildly reduced with moderately elevate PA systolic pressure, moderate MR, mild AR, moderate TR. - cMRI 10/10/23:  Normal LV size, moderately reduced LV systolic function. LVEF 35%. No LGE or scar. No evidence for infiltrative or inflammatory disease. Mildly reduced RV systolic  function. No significant valvular abnormalities. Findings suggest non ischemic cardiomyopathy - begin jardiance 10mg  daily - continue losartan  12.5mg  daily (has been out X 2 days as RX was sent to [facility]; she is getting this today) - continue metoprolol succinate 25mg  daily (has been out X 2 days per above reason) - continue spironolactone 12.5mg  daily (has been out X 2 days per above reason) - BMET next visit - wear compression socks daily especially while at work since she's on her feet most of the day - reviewed not using salt for seasoning - sleep study to rule out sleep apnea - BNP 10/06/23 was 235.0  2: Syncope- - wearing lifevest but no shocks delivered - EP saw during recent admission; EP referral placed today - ? Candidate for loop recorder - BMET next visit - BMET 10/12/23 reviewed: sodium 138, potassium 4.7, creatinine 0.88 and GFR >60  3: T2DM- - A1c 10/09/23 was 10.4% - continue metformin & insulin  4: Anemia- - Hg 10/09/23 was 11.1 - has upcoming PCP appt early May   Return in 1 month, sooner if needed.   Charlette Console, FNP 10/13/23

## 2023-10-14 ENCOUNTER — Other Ambulatory Visit: Payer: Self-pay

## 2023-10-14 ENCOUNTER — Encounter: Payer: Self-pay | Admitting: Family

## 2023-10-14 ENCOUNTER — Ambulatory Visit: Attending: Family | Admitting: Family

## 2023-10-14 VITALS — BP 139/94 | HR 112 | Wt 174.1 lb

## 2023-10-14 DIAGNOSIS — R0683 Snoring: Secondary | ICD-10-CM | POA: Diagnosis not present

## 2023-10-14 DIAGNOSIS — E1142 Type 2 diabetes mellitus with diabetic polyneuropathy: Secondary | ICD-10-CM | POA: Insufficient documentation

## 2023-10-14 DIAGNOSIS — Z7984 Long term (current) use of oral hypoglycemic drugs: Secondary | ICD-10-CM | POA: Insufficient documentation

## 2023-10-14 DIAGNOSIS — Z794 Long term (current) use of insulin: Secondary | ICD-10-CM | POA: Diagnosis not present

## 2023-10-14 DIAGNOSIS — R55 Syncope and collapse: Secondary | ICD-10-CM | POA: Diagnosis not present

## 2023-10-14 DIAGNOSIS — I5020 Unspecified systolic (congestive) heart failure: Secondary | ICD-10-CM | POA: Diagnosis not present

## 2023-10-14 DIAGNOSIS — Z5986 Financial insecurity: Secondary | ICD-10-CM | POA: Insufficient documentation

## 2023-10-14 DIAGNOSIS — D649 Anemia, unspecified: Secondary | ICD-10-CM | POA: Diagnosis not present

## 2023-10-14 DIAGNOSIS — E119 Type 2 diabetes mellitus without complications: Secondary | ICD-10-CM

## 2023-10-14 DIAGNOSIS — Z8673 Personal history of transient ischemic attack (TIA), and cerebral infarction without residual deficits: Secondary | ICD-10-CM | POA: Diagnosis not present

## 2023-10-14 DIAGNOSIS — E873 Alkalosis: Secondary | ICD-10-CM | POA: Diagnosis not present

## 2023-10-14 DIAGNOSIS — I272 Pulmonary hypertension, unspecified: Secondary | ICD-10-CM | POA: Diagnosis not present

## 2023-10-14 DIAGNOSIS — I428 Other cardiomyopathies: Secondary | ICD-10-CM | POA: Diagnosis present

## 2023-10-14 DIAGNOSIS — Z79899 Other long term (current) drug therapy: Secondary | ICD-10-CM | POA: Diagnosis not present

## 2023-10-14 MED ORDER — EMPAGLIFLOZIN 10 MG PO TABS
10.0000 mg | ORAL_TABLET | Freq: Every day | ORAL | 5 refills | Status: DC
  Filled 2023-10-14: qty 30, 30d supply, fill #0

## 2023-10-14 NOTE — Patient Instructions (Addendum)
 Medication Changes:  START JARDIANCE 10 MG ONCE DAILY   Testing/Procedures:  Shawnee Dellen, FNP wants you to complete an in-home sleep study. However, we have to receive your insurance's approval first in order for them to pay for it. Insurance companies are a little short staffed currently, so it can take up to a few weeks to receive the approval. If approved, we will give you the sleep study at your next appointment or call you to come pick it up before your next appointment.   Referrals:  We have placed a referral today Electrophysiology Cardiology for your syncope and Lifevest. They should reach out to you within 1-2 weeks. If they do not, please reach out to them. Their information is below on this AVS. Their office is located in this building on the Lower Level if you'd like to go down there to get scheduled sooner.   Follow-Up in: 1 MONTH WITH DR. Julane Ny.   At the [facility], you and your health needs are our priority. We have a designated team specialized in the treatment of Heart Failure. This Care Team includes your primary Heart Failure Specialized Cardiologist (physician), Advanced Practice Providers (APPs- Physician Assistants and Nurse Practitioners), and Pharmacist who all work together to provide you with the care you need, when you need it.   You may see any of the following providers on your designated Care Team at your next follow up:  Dr. Jules Oar Dr. Peder Bourdon Dr. Alwin Baars Dr. Judyth Nunnery Shawnee Dellen, FNP Bevely Brush, RPH-CPP  Please be sure to bring in all your medications bottles to every appointment.   Need to Contact Us :  If you have any questions or concerns before your next appointment please send us  a message through Pierre or call our office at [phone].    TO LEAVE A MESSAGE FOR THE NURSE SELECT OPTION 2, PLEASE LEAVE A MESSAGE INCLUDING: YOUR NAME DATE OF BIRTH CALL BACK NUMBER REASON FOR CALL**this is  important as we prioritize the call backs  YOU WILL RECEIVE A CALL BACK THE SAME DAY AS LONG AS YOU CALL BEFORE 4:00 PM

## 2023-10-14 NOTE — Progress Notes (Signed)
 [facility] - HEART FAILURE CLINIC - PHARMACIST COUNSELING NOTE  Adherence Assessment  Do you ever forget to take your medication? [] Yes [x] No  Do you ever skip doses due to side effects? [] Yes [x] No  Do you have trouble affording your medicines? [] Yes [x] No  Are you ever unable to pick up your medication due to transportation difficulties? [] Yes [x] No  Do you ever stop taking your medications because you don't believe they are helping? [] Yes [x] No  Do you check your weight daily? [x] Yes [] No  Do you check your blood pressure daily?  BP 120-80; lower  [x] Yes [] No  Adherence strategy: Pill box  Barriers to obtaining medications: None    Vital signs: HR ***, BP ***, weight (pounds) *** ECHO: Date ***, EF ***, notes *** Cath: Date ***, EF ***, notes *** Renal function: Date ***, GFR ***  Current Guideline-Directed Medical Therapy/Evidence Based Medicine  ACE/ARB/ARNI: {AR_ARNI/ACEi/ARB:25599} Target dose:  Beta Blocker: {AR_Beta blocker:25598} Target dose:  Aldosterone Antagonist: {AR_Mineralocorticoid receptor antagonists:25602} Target dose:  SGLT2i: {AR_SGLT2i:25603} Target dose:   Diuretic: {AR_Diuretics:25604}  Others:   ASSESSMENT *** year old {Gender Description:210950033} who presents to the HF clinic ***. PMH is significant for ***.   Recent ED visit or hospitalization (past 6 months): Date - ***, CC - *** , Admission Dx (if applicable) - ***    COUNSELING POINTS/CLINICAL PEARLS  Can use the HFCMEDCOUNSELING dot phrase here   PLAN    Time spent: *** minutes

## 2023-10-14 NOTE — Progress Notes (Signed)
 Patient Name: Mary Velasquez        DOB: April 15, 1981      Height:  5'0     Weight: 174 lbs  Office Name: Advanced Heart Failure         Referring Provider:  Today's Date: 10/14/2023  Date:  10/14/2023 STOP BANG RISK ASSESSMENT S (snore) Have you been told that you snore?     YES   T (tired) Are you often tired, fatigued, or sleepy during the day?   YES  O (obstruction) Do you stop breathing, choke, or gasp during sleep? NO   P (pressure) Do you have or are you being treated for high blood pressure? YES   B (BMI) Is your body index greater than 35 kg/m? YES   A (age) Are you 66 years old or older? NO   N (neck) Do you have a neck circumference greater than 16 inches?   NO   G (gender) Are you a female? NO   TOTAL STOP/BANG "YES" ANSWERS 4                                                                       For Office Use Only              Procedure Order Form    YES to 3+ Stop Bang questions OR two clinical symptoms - patient qualifies for WatchPAT (CPT 95800)             Clinical Notes: Will consult Sleep Specialist and refer for management of therapy due to patient increased risk of Sleep Apnea. Ordering a sleep study due to the following two clinical symptoms: Excessive daytime sleepiness G47.10 / Loud snoring R06.83 / Unrefreshed by sleep G47.8 / History of high blood pressure R03.0    I understand that I am proceeding with a home sleep apnea test as ordered by my treating physician. I understand that untreated sleep apnea is a serious cardiovascular risk factor and it is my responsibility to perform the test and seek management for sleep apnea. I will be contacted with the results and be managed for sleep apnea by a local sleep physician. I will be receiving equipment and further instructions from [facility]. I shall promptly ship back the equipment via the included mailing label. I understand my insurance will be billed for the test and as the patient I am responsible  for any insurance related out-of-pocket costs incurred. I have been provided with written instructions and can call for additional video or telephonic instruction, with 24-hour availability of qualified personnel to answer any questions: Patient Help Desk [phone].  Patient Signature ______________________________________________________   Date______________________ Patient Telemedicine Verbal Consent

## 2023-11-06 ENCOUNTER — Telehealth: Payer: Self-pay | Admitting: Family

## 2023-11-06 NOTE — Telephone Encounter (Signed)
 Called to confirm/remind patient of their appointment at the [facility] on 11/07/23.   Appointment:   [x] Confirmed  [] Left mess   [] No answer/No voice mail  [] VM Full/unable to leave message  [] Phone not in service  Patient reminded to bring all medications and/or complete list.  Confirmed patient has transportation. Gave directions, instructed to utilize valet parking.

## 2023-11-06 NOTE — Progress Notes (Signed)
 Advanced Heart Failure Clinic Note    PCP: Bruce Caper PA @ [facility] (to be seen 05/25) Cardiologist: Sammy Crisp, MD   Chief Complaint: shortness of breath  HPI:  Mary Velasquez is a 43 y/o female with a history of type 2 diabetes, recent hysterectomy complicated by post-op anemia requiring blood transfusion, inappropriate sinus tachycardia, brain AVM, obesity, anemia, peripheral neuropathy and chronic heart failure. Has paternal first cousin who died suddenly in her 30s from cardiac arrest.    Echo 10/2022 showed EF 60-65% without RWMA, mild LVH, G1DD, normal RV systolic function and sive, small pericardial effusion present, with RA pressure 3 mmHg.   Admitted 10/06/23 with dizziness and syncope. 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 When she came to, she was laying on the ground and the resident was yelling her name. BNP was 235, HS-troponin was 35. CTA chest was negative for PE, but showed cardiomegaly, pulmonary edema, small bilateral pleural effusions, and dilated pulmonary artery. 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 Echocardiogram 10/07/23: LVEF 30-35%, mild LVH, GLS of -6.6%, RV function mildly reduced with moderately elevate PA systolic pressure, moderate MR, mild AR, moderate TR. EP consulted. Right heart cath 10/09/23: showed mild to moderate WHO class II pulmonary hypertension. IV Lasix  has been discontinued due to contraction alkalosis and hypotension. Lifevest placed prior to discharge.   cMRI 10/10/23:  IMPRESSION: 1. Normal LV size, moderately reduced LV systolic function. LVEF 35%.  2.  No LGE or scar.  3.  No evidence for infiltrative or inflammatory disease.  4.  Mildly reduced RV systolic function.  5.  No significant valvular abnormalities.  6.  Findings suggest non ischemic cardiomyopathy  Seen in [facility] 04/25 and jardiance  was started and losartan , metoprolol  and spironolactone  were  resumed as she had been out of RX.  She presents today for a HF follow-up visit with a chief complaint of shortness of breath. Has associated fatigue, wheezing, productive cough, dizziness and minimal pedal edema along with this. Denies chest pain, abdominal distention or weight gain. Reports that dizziness improves with sitting. When she stands, she becomes dizzy where she has almost passed out. Has a decreased appetite although drinking fluids well.   Has not taken any of her medications today. No longer wearing lifevest due to comfort. She says that she's getting SBP at home in the 80's at times.    ROS: All systems negative except what is listed in HPI, PMH and Problem List   Past Medical History:  Diagnosis Date   Anemia    AVM (arteriovenous malformation)    Diabetes mellitus without complication (HCC)    History of kidney stones    Peripheral neuropathy    Pneumonia    Tachycardia     Current Outpatient Medications  Medication Sig Dispense Refill   Blood Glucose Monitoring Suppl DEVI 1 each by Does not apply route 3 (three) times daily. May dispense any manufacturer covered by patient's insurance. 1 each 0   empagliflozin  (JARDIANCE ) 10 MG TABS tablet Take 1 tablet (10 mg total) by mouth daily before breakfast. 30 tablet 5   ferrous sulfate  325 (65 FE) MG EC tablet Take 1 tablet (325 mg total) by mouth 2 (two) times daily. 60 tablet 3   gabapentin  (NEURONTIN ) 300 MG capsule Take 600 mg by mouth at bedtime.     Glucose Blood (BLOOD GLUCOSE TEST STRIPS) STRP 1 each by Does not apply route 3 (  three) times daily. Use as directed to check blood sugar. May dispense any manufacturer covered by patient's insurance and fits patient's device. 100 strip 0   insulin  aspart (NOVOLOG ) 100 UNIT/ML FlexPen Inject 4 Units into the skin 3 (three) times daily with meals. Only take if eating a meal AND Blood Glucose (BG) is 80 or higher. (Patient taking differently: Inject 8 Units into the skin 3  (three) times daily with meals. Only take if eating a meal AND Blood Glucose (BG) is 80 or higher.) 3 mL 2   insulin  glargine (LANTUS ) 100 UNIT/ML Solostar Pen Inject 14 Units into the skin at bedtime. May substitute as needed per insurance. (Patient taking differently: Inject 18-50 Units into the skin at bedtime. May substitute as needed per insurance.) 4.2 mL 2   Insulin  Pen Needle (PEN NEEDLES) 31G X 5 MM MISC 1 each by Does not apply route 3 (three) times daily. May dispense any manufacturer covered by patient's insurance. 100 each 0   Lancet Device MISC 1 each by Does not apply route 3 (three) times daily. May dispense any manufacturer covered by patient's insurance. 1 each 0   Lancets MISC 1 each by Does not apply route 3 (three) times daily. Use as directed to check blood sugar. May dispense any manufacturer covered by patient's insurance and fits patient's device. 100 each 0   losartan  (COZAAR ) 25 MG tablet Take 0.5 tablets (12.5 mg total) by mouth daily. 30 tablet 0   metFORMIN  (GLUCOPHAGE ) 1000 MG tablet Take 1,000 mg by mouth 2 (two) times daily with a meal. Breakfast & supper     metoprolol  succinate (TOPROL -XL) 25 MG 24 hr tablet Take 1 tablet (25 mg total) by mouth daily. 30 tablet 0   norethindrone -ethinyl estradiol  1/35 (ORTHO-NOVUM ) tablet Take 1 pill 3 times per day for 3 days, then 1 pill 2 times per day for 2 days, then 1 pill daily until you see your outpatient gynecologist. 30 tablet 2   nortriptyline  (PAMELOR ) 50 MG capsule Take 100 mg by mouth at bedtime.     spironolactone  (ALDACTONE ) 25 MG tablet Take 0.5 tablets (12.5 mg total) by mouth daily. 15 tablet 0   No current facility-administered medications for this visit.    Allergies  Allergen Reactions   Oxycodone Anaphylaxis and Swelling    Throat swelling   Oxycontin [Oxycodone Hcl] Anaphylaxis and Swelling    Throat swelling       Social History   Socioeconomic History   Marital status: Single    Spouse name:  Not on file   Number of children: 2   Years of education: Not on file   Highest education level: Some college, no degree  Occupational History   Not on file  Tobacco Use   Smoking status: Never   Smokeless tobacco: Never  Vaping Use   Vaping status: Never Used  Substance and Sexual Activity   Alcohol use: No   Drug use: Not Currently   Sexual activity: Not on file  Other Topics Concern   Not on file  Social History Narrative   Lives alone   Social Drivers of Health   Financial Resource Strain: High Risk (10/09/2023)   Overall Financial Resource Strain (CARDIA)    Difficulty of Paying Living Expenses: Hard  Food Insecurity: No Food Insecurity (10/09/2023)   Hunger Vital Sign    Worried About Running Out of Food in the Last Year: Never true    Ran Out of Food in the Last Year:  Never true  Transportation Needs: No Transportation Needs (10/09/2023)   PRAPARE - Administrator, Civil Service (Medical): No    Lack of Transportation (Non-Medical): No  Physical Activity: Not on file  Stress: Not on file  Social Connections: Not on file  Intimate Partner Violence: Not At Risk (10/07/2023)   Humiliation, Afraid, Rape, and Kick questionnaire    Fear of Current or Ex-Partner: No    Emotionally Abused: No    Physically Abused: No    Sexually Abused: No      Family History  Problem Relation Age of Onset   Heart disease Paternal Aunt    Heart attack Maternal Grandfather    Heart disease Maternal Grandfather    Heart attack Cousin    Heart disease Cousin    CAD Neg Hx    Seizures Neg Hx    Vitals:   11/07/23 0948  BP: 110/74  Pulse: (!) 108  SpO2: 99%  Weight: 163 lb 12.8 oz (74.3 kg)   Wt Readings from Last 3 Encounters:  11/07/23 163 lb 12.8 oz (74.3 kg)  10/14/23 174 lb 2 oz (79 kg)  10/12/23 170 lb 13.7 oz (77.5 kg)   Lab Results  Component Value Date   CREATININE 0.88 10/12/2023   CREATININE 0.84 10/11/2023   CREATININE 0.89 10/10/2023    PHYSICAL  EXAM:  General: Well appearing. No resp difficulty HEENT: normal Neck: supple, no JVD Cor: Regular rhythm, tachycardic. No rubs, gallops or murmurs Lungs: clear Abdomen: soft, nontender, nondistended. Extremities: no cyanosis, clubbing, rash, trace pitting edema left lower leg Neuro: alert & oriented X 3. Moves all 4 extremities w/o difficulty. Affect pleasant   ECG: ST with HR 104 (personally reviewed)    ASSESSMENT & PLAN:  1: NICM with reduced ejection fraction- - unknown etiology at this time; cath without CAD, cMRI without amyloid; consider genetic cause; will do genetic buccal test today - NYHA class II - euvolemic - weight down 11 pounds from last visit here 3 weeks ago - Echo 10/07/23: LVEF 30-35%, mild LVH, GLS of -6.6%, RV function mildly reduced with moderately elevate PA systolic pressure, moderate MR, mild AR, moderate TR. - cMRI 10/10/23:  Normal LV size, moderately reduced LV systolic function. LVEF 35%. No LGE or scar. No evidence for infiltrative or inflammatory disease. Mildly reduced RV systolic function. No significant valvular abnormalities. Findings suggest non ischemic cardiomyopathy - BP 110/74 with SBP at home in the 80's - hold jardiance  10mg   - hold losartan  12.5mg  - resume metoprolol  succinate 25mg  daily due to tachycardia - continue spironolactone  12.5mg  daily  - BMET today - reviewed not using salt for seasoning - sleep study to rule out sleep apnea - BNP 10/06/23 was 235.0  2: Syncope- - no longer wearing lifevest due to comfort - EP to see patient 11/26/23. ? Candidate for loop recorder - BP in office 110/74; reports getting SBP readings at home in the 80's - begin midodrine  2.5mg  BID if SBP is <120 - BMET today - BMET 10/12/23 reviewed: sodium 138, potassium 4.7, creatinine 0.88 and GFR >60  3: T2DM- - A1c 10/09/23 was 10.4% - continue metformin  & insulin   4: Anemia- - Hg 10/09/23 was 11.1   Return in 1 month w/ MD. Sooner if needed.   Charlette Console, FNP 11/06/23

## 2023-11-07 ENCOUNTER — Ambulatory Visit: Attending: Family | Admitting: Family

## 2023-11-07 ENCOUNTER — Other Ambulatory Visit: Payer: Self-pay

## 2023-11-07 ENCOUNTER — Encounter: Payer: Self-pay | Admitting: Family

## 2023-11-07 VITALS — BP 110/74 | HR 108 | Wt 163.8 lb

## 2023-11-07 DIAGNOSIS — I272 Pulmonary hypertension, unspecified: Secondary | ICD-10-CM | POA: Diagnosis not present

## 2023-11-07 DIAGNOSIS — Z5986 Financial insecurity: Secondary | ICD-10-CM | POA: Insufficient documentation

## 2023-11-07 DIAGNOSIS — R55 Syncope and collapse: Secondary | ICD-10-CM | POA: Insufficient documentation

## 2023-11-07 DIAGNOSIS — I517 Cardiomegaly: Secondary | ICD-10-CM | POA: Diagnosis not present

## 2023-11-07 DIAGNOSIS — I428 Other cardiomyopathies: Secondary | ICD-10-CM | POA: Diagnosis not present

## 2023-11-07 DIAGNOSIS — Z8673 Personal history of transient ischemic attack (TIA), and cerebral infarction without residual deficits: Secondary | ICD-10-CM | POA: Diagnosis not present

## 2023-11-07 DIAGNOSIS — I5022 Chronic systolic (congestive) heart failure: Secondary | ICD-10-CM | POA: Insufficient documentation

## 2023-11-07 DIAGNOSIS — Z7984 Long term (current) use of oral hypoglycemic drugs: Secondary | ICD-10-CM | POA: Diagnosis not present

## 2023-11-07 DIAGNOSIS — E1142 Type 2 diabetes mellitus with diabetic polyneuropathy: Secondary | ICD-10-CM | POA: Insufficient documentation

## 2023-11-07 DIAGNOSIS — D649 Anemia, unspecified: Secondary | ICD-10-CM | POA: Diagnosis not present

## 2023-11-07 DIAGNOSIS — J90 Pleural effusion, not elsewhere classified: Secondary | ICD-10-CM | POA: Diagnosis not present

## 2023-11-07 DIAGNOSIS — J811 Chronic pulmonary edema: Secondary | ICD-10-CM | POA: Insufficient documentation

## 2023-11-07 DIAGNOSIS — E119 Type 2 diabetes mellitus without complications: Secondary | ICD-10-CM | POA: Diagnosis not present

## 2023-11-07 DIAGNOSIS — Z794 Long term (current) use of insulin: Secondary | ICD-10-CM | POA: Diagnosis not present

## 2023-11-07 DIAGNOSIS — Q282 Arteriovenous malformation of cerebral vessels: Secondary | ICD-10-CM | POA: Diagnosis not present

## 2023-11-07 DIAGNOSIS — I5020 Unspecified systolic (congestive) heart failure: Secondary | ICD-10-CM

## 2023-11-07 DIAGNOSIS — R000 Tachycardia, unspecified: Secondary | ICD-10-CM | POA: Insufficient documentation

## 2023-11-07 DIAGNOSIS — Z79899 Other long term (current) drug therapy: Secondary | ICD-10-CM | POA: Diagnosis not present

## 2023-11-07 DIAGNOSIS — E669 Obesity, unspecified: Secondary | ICD-10-CM | POA: Diagnosis not present

## 2023-11-07 MED ORDER — MIDODRINE HCL 2.5 MG PO TABS
2.5000 mg | ORAL_TABLET | Freq: Three times a day (TID) | ORAL | 5 refills | Status: DC
  Filled 2023-11-07: qty 90, 30d supply, fill #0

## 2023-11-07 MED ORDER — MIDODRINE HCL 2.5 MG PO TABS
2.5000 mg | ORAL_TABLET | Freq: Two times a day (BID) | ORAL | 5 refills | Status: DC
  Filled 2023-11-07: qty 60, 30d supply, fill #0

## 2023-11-07 NOTE — Progress Notes (Signed)
 TTR genetic testing collected via [facility] per Shawnee Dellen, FNP.  Order form completed, signed and shipped with sample by Fed[employer] to [facility].

## 2023-11-07 NOTE — Patient Instructions (Addendum)
 Medication Changes:  RESTART Metoprolol    STOP Losartan  AND Jardiance   START midodrine  2.5mg  (1 tab) two times daily if the top number of your blood pressure is LESS THAN 120  Lab Work:  Go DOWN to LOWER LEVEL (LL) to have your blood work completed inside of [employer] office.  We will only call you if the results are abnormal or if the provider would like to make medication changes.   Testing/Procedures:  Genetic testing has been collected, this has to be sent to [place]  for processing and can take 1-2 weeks for us  to get results back.  We will let you know the results once reviewed by your provider.    Please complete your sleep study at your earliest convenience. Please chose a night that you can complete 5-6 hours of sleep.   Follow-Up in: Please keep follow up appointment with the Advanced Heart Failure Clinic in June.   At the Advanced Heart Failure Clinic, you and your health needs are our priority. We have a designated team specialized in the treatment of Heart Failure. This Care Team includes your primary Heart Failure Specialized Cardiologist (physician), Advanced Practice Providers (APPs- Physician Assistants and Nurse Practitioners), and Pharmacist who all work together to provide you with the care you need, when you need it.   You may see any of the following providers on your designated Care Team at your next follow up:  Dr. Jules Oar Dr. Peder Bourdon Dr. Alwin Baars Dr. Judyth Nunnery Shawnee Dellen, FNP Bevely Brush, RPH-CPP  Please be sure to bring in all your medications bottles to every appointment.   Need to Contact Us :  If you have any questions or concerns before your next appointment please send us  a message through [facility] or call our office at [phone].    TO LEAVE A MESSAGE FOR THE NURSE SELECT OPTION 2, PLEASE LEAVE A MESSAGE INCLUDING: YOUR NAME DATE OF BIRTH CALL BACK NUMBER REASON FOR CALL**this is important as we prioritize the  call backs  YOU WILL RECEIVE A CALL BACK THE SAME DAY AS LONG AS YOU CALL BEFORE 4:00 PM

## 2023-11-07 NOTE — Progress Notes (Signed)
 [facility] - HEART FAILURE CLINIC - PHARMACIST COUNSELING NOTE  Mary Velasquez is a 43 y.o. year old female who presents to the HF clinic for her 3 week follow-up appointment. They have a past medical history significant for  type 2 diabetes, recent hysterectomy complicated by post-op anemia requiring blood transfusion, inappropriate sinus tachycardia, brain AVM, obesity, anemia, peripheral neuropathy and chronic heart failure. Initially, they were diagnosed with HFrEF on 10/07/2023 secondary to unknown at this time with an EF of 35%. At their last appointment on they were started on Jardiance . They had been out of medications the last 2 days due to having to pick them up from [facility] pharmacy. No medication changes other then adding Jardiance  were made at this time.    Current Guideline-Directed Medical Therapy (GDMT)  ACE/ARB/ARNI: Losartan  12.5 mg daily Beta Blocker: Metoprolol  succinate 25 mg daily Aldosterone Antagonist: Spironolactone  12.5 mg daily Diuretic: NA SGLT2i: Empagliflozin  10 mg daily  Vital Signs and Trends  Recent Trends BP Readings from Last 3 Encounters:  10/14/23 (!) 139/94  10/12/23 108/80  08/24/23 128/84   Wt Readings from Last 3 Encounters:  10/14/23 174 lb 2 oz (79 kg)  10/12/23 170 lb 13.7 oz (77.5 kg)  08/21/23 179 lb 14.3 oz (81.6 kg)   Today's visit HR 108 BP 110/74 Weight (pounds) 163.8  Do you check your weight daily? [x] Yes [] No  Do you check you blood pressure daily [x] Yes [] No  PTA: 88/68 PTA Yesterday 169 lbs   Cardiac Imaging  ECHO: Date 10/07/2023, EF 30-25%  Changes Since Last Visit   Hospitalizations/ED visits (last 6 months):  -- Date - 10/06/23 - 10/12/23, [facility] - loss of consciousness, Admission Dx - syncope, ataxia, abnormal chest CT -- Date - 08/21/23 - 08/23/23, [facility] - presented for surgical procedure  Medication changes: NA  Pertinent Labs  Lab Results  Component Value Date   NA 138 10/12/2023   CL 102 10/12/2023    K 4.7 10/12/2023   CO2 29 10/12/2023   BUN 19 10/12/2023   CREATININE 0.88 10/12/2023   GFRNONAA >60 10/12/2023   CALCIUM  9.3 10/12/2023   PHOS 3.0 07/10/2018   ALBUMIN  2.2 (L) 01/16/2023   GLUCOSE 209 (H) 10/12/2023   No results found for: "LDLCALC" Lab Results  Component Value Date   HGBA1C 10.4 (H) 10/09/2023   ASSESSMENT  Reason for today's visit   Patient presents alone/with family to assess current guideline directed medical therapy. This is a routine follow-up, but patient presents with concerns about her low BP when taking the medications They report in the 80s when taking all her medications and this is causing her to become extremely faint to the point of almost falling over. Patient report they've held their medications for the last week before of this. They also reported experiencing a dry cough that just started a couple weeks ago. They enquired about using midodrine  to help with their low blood pressures.   Guideline-Directed Medical Therapy Evaluation  ACEi/ARB/ARNi Target Achieved [] Yes [x] No Up-titration candidate today [] Yes [x] No Safety Concerns [x]  Hypotension []  Hyperkalemia   Beta-Blocker  Target Achieved [] Yes [x] No Up-titration candidate today [] Yes [x] No  MRA Target Achieved [] Yes [x] No Up-titration candidate today [] Yes [x] No Safety Concerns []  K > 5.0  []  eGFR < 30  [x]  Hypotension  SGLT-2 Target Achieved [x] Yes [] No Up-titration candidate today [] Yes [x] No Safety Concerns []  eGFR < 20 [x]  A1c > 10  Loop  Patient is not on this medication at this time  due to hypotension   Heart Failure Symptoms & Volume Status   Dyspnea on exertion [x] Yes [] No  Fatigue [x] Yes [] No  Lower extremity edema [x] Yes [] No  Weight gain (> 5 lbs) [] Yes [x] No  Cough or wheezing  [x] Yes [] No  Abdominal bloating/Discomfort [] Yes [x] No  Early satiety or poor appetite [x] Yes [] No  Dizziness of lightheadedness  [x] Yes [] No  # Pillows used at night:  0  Adherence Assessment  Do you ever forget to take your medication? [] Yes [x] No  Do you ever skip doses due to side effects? [x] Yes [] No  Do you have trouble affording your medicines? [] Yes [x] No  Are you ever unable to pick up your medication due to transportation difficulties? [] Yes [x] No  Do you ever stop taking your medications because you don't believe they are helping? [] Yes [x] No   Adherence strategy:  -- Haven't taken the last week because BP have been low Barriers to obtaining medications: None identified  Did you take your medications before your appointment today: [] Yes [x] No  Preventative Care   Parameter Most Recent Result Goal/Status Current Medications  LDL NA < 70 - 55 mg/dL  Statin: NA  Z6X 09.6  7 - 8 % uncontrolled Novolog  8 units Lantus  18 - 50 units  Metfomrin 1000 mg BID   BP Control 101/58 < 130/80 Below goal Added midodrine  11/07/2023 due to low BP  ECHO 30-35% 6 - 12 months  10/07/2023   PLAN  Medication Interventions:  -- Recommend stopping their losartan  due to cough and extremely low blood pressures while taking -- Recommend resuming their metoprolol  to help with their baseline tachycardia since it has minimal impact on BP -- Recommend stopping their SGLT-2 due to low BP and high A1c  -- If in the future also needs to stop their spironolactone , patient will likely need lasix  PRN to help with fluid management -- Consider adding midodrine  if reducing their current regimen doesn't help with BP improvement  -- Continue current regimen per NP Preventative Care Follow-up:  -- Consider checking patient's LDL to assess for need of statin therapy in the future  Lab or imaging orders:  -- Consider BMET today  Time spent: 15 minutes  Thank you for allowing pharmacy to participate in this patient's care.   Ardon Franklin K Yetzali Weld, Pharm.D. Pharmacy Resident 11/07/2023 7:57 AM

## 2023-11-08 LAB — BASIC METABOLIC PANEL WITH GFR
BUN/Creatinine Ratio: 16 (ref 9–23)
BUN: 17 mg/dL (ref 6–24)
CO2: 21 mmol/L (ref 20–29)
Calcium: 9.5 mg/dL (ref 8.7–10.2)
Chloride: 97 mmol/L (ref 96–106)
Creatinine, Ser: 1.09 mg/dL — ABNORMAL HIGH (ref 0.57–1.00)
Glucose: 268 mg/dL — ABNORMAL HIGH (ref 70–99)
Potassium: 4.8 mmol/L (ref 3.5–5.2)
Sodium: 139 mmol/L (ref 134–144)
eGFR: 65 mL/min/{1.73_m2} (ref >59)

## 2023-11-09 ENCOUNTER — Encounter: Payer: Self-pay | Admitting: Family

## 2023-11-10 ENCOUNTER — Encounter: Payer: Self-pay | Admitting: Cardiology

## 2023-11-12 ENCOUNTER — Ambulatory Visit: Admitting: Cardiology

## 2023-11-14 ENCOUNTER — Other Ambulatory Visit: Payer: Self-pay

## 2023-11-17 ENCOUNTER — Other Ambulatory Visit: Payer: Self-pay

## 2023-11-17 ENCOUNTER — Other Ambulatory Visit: Payer: Self-pay | Admitting: Family

## 2023-11-17 MED FILL — Metoprolol Succinate Tab ER 24HR 25 MG (Tartrate Equiv): ORAL | 30 days supply | Qty: 30 | Fill #0 | Status: CN

## 2023-11-17 MED FILL — Spironolactone Tab 25 MG: ORAL | 30 days supply | Qty: 15 | Fill #0 | Status: CN

## 2023-11-21 ENCOUNTER — Telehealth: Payer: Self-pay | Admitting: Internal Medicine

## 2023-11-21 NOTE — Telephone Encounter (Signed)
 Called to r/s appt on 12/01/23

## 2023-11-26 ENCOUNTER — Ambulatory Visit: Admitting: Cardiology

## 2023-11-27 ENCOUNTER — Other Ambulatory Visit: Payer: Self-pay

## 2023-12-01 ENCOUNTER — Encounter: Admitting: Internal Medicine

## 2023-12-03 ENCOUNTER — Other Ambulatory Visit: Payer: Self-pay

## 2023-12-03 ENCOUNTER — Ambulatory Visit: Attending: Cardiology | Admitting: Cardiology

## 2023-12-03 ENCOUNTER — Encounter: Payer: Self-pay | Admitting: Cardiology

## 2023-12-03 VITALS — BP 126/84 | HR 100 | Ht 60.0 in | Wt 174.0 lb

## 2023-12-03 DIAGNOSIS — R55 Syncope and collapse: Secondary | ICD-10-CM

## 2023-12-03 DIAGNOSIS — I428 Other cardiomyopathies: Secondary | ICD-10-CM

## 2023-12-03 DIAGNOSIS — I5020 Unspecified systolic (congestive) heart failure: Secondary | ICD-10-CM | POA: Diagnosis not present

## 2023-12-03 MED ORDER — FUROSEMIDE 40 MG PO TABS
40.0000 mg | ORAL_TABLET | ORAL | 3 refills | Status: DC | PRN
  Filled 2023-12-03: qty 45, 30d supply, fill #0

## 2023-12-03 NOTE — Progress Notes (Signed)
 Electrophysiology Office Follow up Visit Note:    Date:  12/03/2023   ID:  Mary Velasquez, DOB 1981-02-12, MRN [id number]  PCP:  System, Provider Not In  [facility] Cardiologist:  Sammy Crisp, MD  [facility] Electrophysiologist:  Boyce Byes, MD    Interval History:     Mary Velasquez is a 43 y.o. female who presents for a follow up visit.   I last saw the patient virtually when she was admitted to [facility] regional in April of this year.  She was admitted to the hospital after a syncopal episode and had a newly reduced left ventricular function.  The plan at the time was to get her started on GDMT for the reduced ejection fraction and reassess LV function in 3 months.  If the EF improved significantly, could proceed without defibrillator implant.  If the EF remains low, plan to implant defibrillator only if the hemoglobin A1c had improved significantly.  She described 2 episodes of syncope to me when she was hospitalized.  During 1 episode she woke up on the floor in patient's room.  The patient was actually waking her up.  Apparently she suddenly lost consciousness.  Another episode on April 6 was unwitnessed.  She recently saw Shawnee Dellen in the heart failure clinic.  Her Jardiance  and losartan  had to be stopped because of hypotension.  She was given an as needed prescription for midodrine .  No further syncopal episodes.  Today she is doing okay.  No further syncopal episodes.  After she stopped the Jardiance  she has noticed a slow increase in her weight.  She thinks she is up about 15 pounds.  She notices fluid in her legs, thighs and even her abdomen.  She is not short of breath.  She does feel fatigued with exertion.       Past medical, surgical, social and family history were reviewed.  ROS:   Please see the history of present illness.    All other systems reviewed and are negative.  EKGs/Labs/Other Studies Reviewed:    The following studies were reviewed  today:  Last hemoglobin A1c 10.4 in April 2025  October 13, 2023 cardiac MRI showed an EF of 35%, global hypokinesis, no late gadolinium enhancement        Physical Exam:    VS:  BP 126/84   Pulse 100   Ht 5' (1.524 m)   Wt 174 lb (78.9 kg)   LMP  (LMP Unknown) Comment: UPT neg 08/21/23  SpO2 97%   BMI 33.98 kg/m     Wt Readings from Last 3 Encounters:  12/03/23 174 lb (78.9 kg)  11/07/23 163 lb 12.8 oz (74.3 kg)  10/14/23 174 lb 2 oz (79 kg)     GEN: no distress CARD: RRR, No MRG.  1-2+ pitting bilateral lower extremity edema up to the knees.  Pending edema extends into the thighs and abdomen. RESP: No IWOB. CTAB.      ASSESSMENT:    1. NICM (nonischemic cardiomyopathy) (HCC)   2. Syncope, unspecified syncope type   3. HFrEF (heart failure with reduced ejection fraction) (HCC)    PLAN:    In order of problems listed above:  #Syncope Unclear cause.  Could still be orthostatic based on the history.  We discussed the possibility of arrhythmic syncope.   #Chronic systolic heart failure Continue spironolactone , metoprolol .  Could not tolerate Jardiance  or losartan  because of hypotension.   I would like to reassess her LV function in  3 months after the hospitalization to make sure the EF is persistently low.  This would be around July 14.  If the EF is 35% or less on the limited echo, favor proceeding with ICD implant at that time.  Given her age, lack of pacing requirement and narrow QRS, this can be a subcutaneous ICD.   The patient has a non ischemic CM (EF 35%), NYHA Class III CHF, and CAD.  At this time, she meets criteria for ICD implantation for primary prevention of sudden death.  I have had a thorough discussion with the patient reviewing options.  The patient and their family (if available) have had opportunities to ask questions and have them answered. The patient and I have decided together through a shared decision making process to proceed with ICD implant at  this time.    Risks, benefits, alternatives to ICD implantation were discussed in detail with the patient today. The patient understands that the risks include but are not limited to bleeding, infection, pneumothorax, perforation, tamponade, vascular damage, renal failure, MI, stroke, death, inappropriate shocks, and lead dislodgement and wishes to proceed.  We will therefore schedule device implantation at the next available time.  For now, I have asked her to start taking Lasix  40 mg by mouth once daily for the next 3 days.  After the 3 days of Lasix  dosing, she will transition to as needed dosing.  She monitors her weights closely at home.  Will get a BMP and mag on Monday and have her see the heart failure clinic back on Monday as well.  She understands that if she develops shortness of breath that is worsening despite treatment with Lasix , she should immediately present to the emergency department.   Signed, Harvie Liner, MD, [facility], [facility] 12/03/2023 11:14 AM    Electrophysiology [facility]

## 2023-12-03 NOTE — Patient Instructions (Signed)
 Medication Instructions:  Your physician has recommended you make the following change in your medication:   1) START taking Lasix  (furosemide ) 40 mg - take once daily for the next 3 days then change to as needed  *If you need a refill on your cardiac medications before your next appointment, please call your pharmacy*  Lab Work: Your provider would like for you to return on Monday, June 9th to have the following labs drawn: BMET and Mg.   Please go to [facility] [address] ([facility] [address], [place] [address] You do not need an appointment.  They are open from 8 am- 4:30 pm.  Lunch from 1:00 pm- 2:00 pm You do NOT need to be fasting.  Testing/Procedures: Echocardiogram (in 6-7 weeks)  Your physician has requested that you have an echocardiogram. Echocardiography is a painless test that uses sound waves to create images of your heart. It provides your doctor with information about the size and shape of your heart and how well your heart's chambers and valves are working. This procedure takes approximately one hour. There are no restrictions for this procedure. Please do NOT wear cologne, perfume, aftershave, or lotions (deodorant is allowed). Please arrive 15 minutes prior to your appointment time.  Please note: We ask at that you not bring children with you during ultrasound (echo/ vascular) testing. Due to room size and safety concerns, children are not allowed in the ultrasound rooms during exams. Our front office staff cannot provide observation of children in our lobby area while testing is being conducted. An adult accompanying a patient to their appointment will only be allowed in the ultrasound room at the discretion of the ultrasound technician under special circumstances. We apologize for any inconvenience.  Follow-Up: At [facility], you and your health needs are our priority.  As part of our continuing mission to provide you with  exceptional heart care, our providers are all part of one team.  This team includes your primary Cardiologist (physician) and Advanced Practice Providers or APPs (Physician Assistants and Nurse Practitioners) who all work together to provide you with the care you need, when you need it.  Your next appointment:   CHF clinic next week - they will contact you to schedule

## 2023-12-04 ENCOUNTER — Other Ambulatory Visit: Payer: Self-pay

## 2023-12-04 MED FILL — Metoprolol Succinate Tab ER 24HR 25 MG (Tartrate Equiv): ORAL | 30 days supply | Qty: 30 | Fill #0 | Status: AC

## 2023-12-04 MED FILL — Spironolactone Tab 25 MG: ORAL | 30 days supply | Qty: 15 | Fill #0 | Status: AC

## 2023-12-06 ENCOUNTER — Other Ambulatory Visit: Payer: Self-pay

## 2023-12-06 ENCOUNTER — Emergency Department

## 2023-12-06 ENCOUNTER — Observation Stay
Admission: EM | Admit: 2023-12-06 | Discharge: 2023-12-08 | Disposition: A | Discharge status: Home / Self Care | Attending: Student | Admitting: Student

## 2023-12-06 DIAGNOSIS — R42 Dizziness and giddiness: Secondary | ICD-10-CM

## 2023-12-06 DIAGNOSIS — D519 Vitamin B12 deficiency anemia, unspecified: Secondary | ICD-10-CM | POA: Diagnosis not present

## 2023-12-06 DIAGNOSIS — Z8774 Personal history of (corrected) congenital malformations of heart and circulatory system: Secondary | ICD-10-CM

## 2023-12-06 DIAGNOSIS — E1142 Type 2 diabetes mellitus with diabetic polyneuropathy: Secondary | ICD-10-CM | POA: Diagnosis present

## 2023-12-06 DIAGNOSIS — E1149 Type 2 diabetes mellitus with other diabetic neurological complication: Secondary | ICD-10-CM | POA: Diagnosis present

## 2023-12-06 DIAGNOSIS — M5030 Other cervical disc degeneration, unspecified cervical region: Secondary | ICD-10-CM | POA: Diagnosis not present

## 2023-12-06 DIAGNOSIS — I428 Other cardiomyopathies: Secondary | ICD-10-CM

## 2023-12-06 DIAGNOSIS — Z7984 Long term (current) use of oral hypoglycemic drugs: Secondary | ICD-10-CM | POA: Insufficient documentation

## 2023-12-06 DIAGNOSIS — R7989 Other specified abnormal findings of blood chemistry: Secondary | ICD-10-CM

## 2023-12-06 DIAGNOSIS — G894 Chronic pain syndrome: Secondary | ICD-10-CM | POA: Diagnosis present

## 2023-12-06 DIAGNOSIS — E860 Dehydration: Secondary | ICD-10-CM

## 2023-12-06 DIAGNOSIS — R9389 Abnormal findings on diagnostic imaging of other specified body structures: Secondary | ICD-10-CM

## 2023-12-06 DIAGNOSIS — R55 Syncope and collapse: Secondary | ICD-10-CM | POA: Diagnosis not present

## 2023-12-06 DIAGNOSIS — T887XXA Unspecified adverse effect of drug or medicament, initial encounter: Secondary | ICD-10-CM | POA: Insufficient documentation

## 2023-12-06 DIAGNOSIS — I5020 Unspecified systolic (congestive) heart failure: Secondary | ICD-10-CM

## 2023-12-06 DIAGNOSIS — R918 Other nonspecific abnormal finding of lung field: Secondary | ICD-10-CM | POA: Insufficient documentation

## 2023-12-06 DIAGNOSIS — G8929 Other chronic pain: Secondary | ICD-10-CM | POA: Diagnosis not present

## 2023-12-06 DIAGNOSIS — Z87798 Personal history of other (corrected) congenital malformations: Secondary | ICD-10-CM | POA: Insufficient documentation

## 2023-12-06 DIAGNOSIS — Z794 Long term (current) use of insulin: Secondary | ICD-10-CM | POA: Insufficient documentation

## 2023-12-06 DIAGNOSIS — E559 Vitamin D deficiency, unspecified: Secondary | ICD-10-CM | POA: Insufficient documentation

## 2023-12-06 DIAGNOSIS — Z79899 Other long term (current) drug therapy: Secondary | ICD-10-CM

## 2023-12-06 DIAGNOSIS — I420 Dilated cardiomyopathy: Secondary | ICD-10-CM | POA: Insufficient documentation

## 2023-12-06 LAB — COMPREHENSIVE METABOLIC PANEL WITH GFR
ALT: 8 U/L (ref 0–44)
AST: 17 U/L (ref 15–41)
Albumin: 2.8 g/dL — ABNORMAL LOW (ref 3.5–5.0)
Alkaline Phosphatase: 98 U/L (ref 38–126)
Anion gap: 9 (ref 5–15)
BUN: 19 mg/dL (ref 6–20)
CO2: 28 mmol/L (ref 22–32)
Calcium: 8.6 mg/dL — ABNORMAL LOW (ref 8.9–10.3)
Chloride: 101 mmol/L (ref 98–111)
Creatinine, Ser: 0.91 mg/dL (ref 0.44–1.00)
GFR, Estimated: >60 mL/min (ref >60)
Glucose, Bld: 95 mg/dL (ref 70–99)
Potassium: 3.9 mmol/L (ref 3.5–5.1)
Sodium: 138 mmol/L (ref 135–145)
Total Bilirubin: 0.5 mg/dL (ref 0.0–1.2)
Total Protein: 7.4 g/dL (ref 6.5–8.1)

## 2023-12-06 LAB — TROPONIN I (HIGH SENSITIVITY)
Troponin I (High Sensitivity): 9 ng/L (ref <18)
Troponin I (High Sensitivity): 61 ng/L — ABNORMAL HIGH (ref <18)
Troponin I (High Sensitivity): 10 ng/L (ref <18)

## 2023-12-06 LAB — CBC WITH DIFFERENTIAL/PLATELET
Abs Immature Granulocytes: 0.07 10*3/uL (ref 0.00–0.07)
Basophils Absolute: 0 10*3/uL (ref 0.0–0.1)
Basophils Relative: 0 %
Eosinophils Absolute: 0.2 10*3/uL (ref 0.0–0.5)
Eosinophils Relative: 3 %
HCT: 36.5 % (ref 36.0–46.0)
Hemoglobin: 11.8 g/dL — ABNORMAL LOW (ref 12.0–15.0)
Immature Granulocytes: 1 %
Lymphocytes Relative: 28 %
Lymphs Abs: 2 10*3/uL (ref 0.7–4.0)
MCH: 27.8 pg (ref 26.0–34.0)
MCHC: 32.3 g/dL (ref 30.0–36.0)
MCV: 85.9 fL (ref 80.0–100.0)
Monocytes Absolute: 0.8 10*3/uL (ref 0.1–1.0)
Monocytes Relative: 11 %
Neutro Abs: 4.1 10*3/uL (ref 1.7–7.7)
Neutrophils Relative %: 57 %
Platelets: 294 10*3/uL (ref 150–400)
RBC: 4.25 MIL/uL (ref 3.87–5.11)
RDW: 13.5 % (ref 11.5–15.5)
WBC: 7.1 10*3/uL (ref 4.0–10.5)
nRBC: 0 % (ref 0.0–0.2)

## 2023-12-06 LAB — BRAIN NATRIURETIC PEPTIDE: B Natriuretic Peptide: 39.6 pg/mL (ref 0.0–100.0)

## 2023-12-06 MED ORDER — ENOXAPARIN SODIUM 40 MG/0.4ML IJ SOSY
0.5000 mg/kg | PREFILLED_SYRINGE | INTRAMUSCULAR | Status: DC
  Administered 2023-12-07 – 2023-12-08 (×2): 37.5 mg via SUBCUTANEOUS
  Filled 2023-12-06 (×2): qty 0.4

## 2023-12-06 MED ORDER — SODIUM CHLORIDE 0.9% FLUSH
3.0000 mL | Freq: Two times a day (BID) | INTRAVENOUS | Status: DC
  Administered 2023-12-07 – 2023-12-08 (×4): 3 mL via INTRAVENOUS

## 2023-12-06 MED ORDER — FUROSEMIDE 40 MG PO TABS: 40.0000 mg | ORAL_TABLET | ORAL | Status: DC | PRN

## 2023-12-06 MED ORDER — METOPROLOL SUCCINATE ER 25 MG PO TB24
25.0000 mg | ORAL_TABLET | Freq: Every day | ORAL | Status: DC
  Administered 2023-12-07 – 2023-12-08 (×2): 25 mg via ORAL
  Filled 2023-12-06 (×2): qty 1

## 2023-12-06 MED ORDER — SODIUM CHLORIDE 0.9 % IV BOLUS
500.0000 mL | Freq: Once | INTRAVENOUS | Status: AC
Start: 2023-12-06 — End: 2023-12-06
  Administered 2023-12-06: 500 mL via INTRAVENOUS

## 2023-12-06 MED ORDER — ACETAMINOPHEN 325 MG RE SUPP: 650.0000 mg | Freq: Four times a day (QID) | RECTAL | Status: DC | PRN

## 2023-12-06 MED ORDER — LACTATED RINGERS IV BOLUS
1000.0000 mL | Freq: Once | INTRAVENOUS | Status: AC
  Administered 2023-12-06: 1000 mL via INTRAVENOUS

## 2023-12-06 MED ORDER — MIDODRINE HCL 5 MG PO TABS
2.5000 mg | ORAL_TABLET | Freq: Two times a day (BID) | ORAL | Status: DC
  Filled 2023-12-06: qty 1

## 2023-12-06 MED ORDER — ONDANSETRON HCL 4 MG/2ML IJ SOLN: 4.0000 mg | Freq: Four times a day (QID) | INTRAMUSCULAR | Status: DC | PRN

## 2023-12-06 MED ORDER — SPIRONOLACTONE 12.5 MG HALF TABLET
12.5000 mg | ORAL_TABLET | Freq: Every day | ORAL | Status: DC
  Administered 2023-12-07 – 2023-12-08 (×2): 12.5 mg via ORAL
  Filled 2023-12-06 (×2): qty 1

## 2023-12-06 MED ORDER — ACETAMINOPHEN 325 MG PO TABS
650.0000 mg | ORAL_TABLET | Freq: Four times a day (QID) | ORAL | Status: DC | PRN
Start: 2023-12-06 — End: 2023-12-08

## 2023-12-06 MED ORDER — INSULIN GLARGINE-YFGN 100 UNIT/ML ~~LOC~~ SOLN
10.0000 [IU] | Freq: Every day | SUBCUTANEOUS | Status: DC
  Administered 2023-12-07 (×2): 10 [IU] via SUBCUTANEOUS
  Filled 2023-12-06 (×3): qty 0.1

## 2023-12-06 MED ORDER — ONDANSETRON HCL 4 MG PO TABS
4.0000 mg | ORAL_TABLET | Freq: Four times a day (QID) | ORAL | Status: DC | PRN
Start: 2023-12-06 — End: 2023-12-08

## 2023-12-06 MED ORDER — HYDROCODONE-ACETAMINOPHEN 5-325 MG PO TABS: 1.0000 | ORAL_TABLET | ORAL | Status: DC | PRN

## 2023-12-06 NOTE — ED Triage Notes (Signed)
 Per EMS pt experience syncopal episode yesterday and just PTA.  500mL NS given by EMS.

## 2023-12-06 NOTE — ED Provider Notes (Signed)
 [facility] Provider Note   Event Date/Time   First MD Initiated Contact with Patient 12/06/23 1506     (approximate) History  Loss of Consciousness  HPI Mary Velasquez is a 43 y.o. female with a past medical history of chronic pain syndrome, CHF, type 2 diabetes who presents via EMS after an episode of syncope yesterday and today.  Patient does endorse chest tightness after awakening from her syncopal event today.  This chest tightness lasted approximately 1-2 hours and resolved spontaneously.  Patient was also orthostatic with EMS prior to arrival and received 500 cc of crystalloid prior to arrival. ROS: Patient currently denies any vision changes, tinnitus, difficulty speaking, facial droop, sore throat, shortness of breath, abdominal pain, nausea/vomiting/diarrhea, dysuria, or weakness/numbness/paresthesias in any extremity   Physical Exam  Triage Vital Signs: ED Triage Vitals [12/06/23 1510]  Encounter Vitals Group     BP      Systolic BP Percentile      Diastolic BP Percentile      Pulse      Resp      Temp      Temp src      SpO2      Weight 170 lb (77.1 kg)     Height 5' (1.524 m)     Head Circumference      Peak Flow      Pain Score      Pain Loc      Pain Education      Exclude from Growth Chart    Most recent vital signs: Vitals:   12/06/23 1923 12/06/23 1935  BP:  (!) 154/99  Pulse:    Resp: 20   Temp: 98 F (36.7 C)   SpO2: 100%    General: Awake, oriented x4. CV:  Good peripheral perfusion. Resp:  Normal effort. Abd:  No distention. Other:  Middle-aged obese African-American female resting comfortably in no acute distress ED Results / Procedures / Treatments  Labs (all labs ordered are listed, but only abnormal results are displayed) Labs Reviewed  COMPREHENSIVE METABOLIC PANEL WITH GFR - Abnormal; Notable for the following components:      Result Value   Calcium  8.6 (*)    Albumin  2.8 (*)    All other components within  normal limits  CBC WITH DIFFERENTIAL/PLATELET - Abnormal; Notable for the following components:   Hemoglobin 11.8 (*)    All other components within normal limits  TROPONIN I (HIGH SENSITIVITY) - Abnormal; Notable for the following components:   Troponin I (High Sensitivity) 61 (*)    All other components within normal limits  BRAIN NATRIURETIC PEPTIDE  TROPONIN I (HIGH SENSITIVITY)  TROPONIN I (HIGH SENSITIVITY)   EKG ED ECG REPORT I, Charleen Conn, the attending physician, personally viewed and interpreted this ECG. Date: 12/06/2023 EKG Time: 1519 Rate: 107 Rhythm: Tachycardic sinus rhythm QRS Axis: normal Intervals: normal ST/T Wave abnormalities: normal Narrative Interpretation: Tachycardic sinus rhythm.  No evidence of acute ischemia RADIOLOGY ED MD interpretation: Single view portable chest x-ray shows low lung volumes with vascular crowding and streaky basilar atelectasis as well as more significant airspace consolidation in the left lower lobe - All radiology independently interpreted and agree with radiology assessment Official radiology report(s): DG Chest Port 1 View Result Date: 12/06/2023 CLINICAL DATA:  Chest pain.  Syncopal episode. EXAM: PORTABLE CHEST 1 VIEW COMPARISON:  12/13/2021 FINDINGS: The cardiac silhouette, mediastinal and hilar contours are within normal limits. Low lung volumes with vascular  crowding and streaky bibasilar atelectasis. More significant airspace consolidation in the left lower lobe could suggest infiltrate/pneumonia. Recommend correlation with clinical findings. The bony thorax is intact. IMPRESSION: 1. Low lung volumes with vascular crowding and streaky bibasilar atelectasis. 2. More significant airspace consolidation in the left lower lobe could suggest infiltrate/pneumonia. Electronically Signed   By: Marrian Siva M.D.   On: 12/06/2023 15:55   PROCEDURES: Critical Care performed: No .1-3 Lead EKG Interpretation  Performed by: Charleen Conn, MD Authorized by: Charleen Conn, MD     Interpretation: abnormal     ECG rate:  111   ECG rate assessment: tachycardic     Rhythm: sinus tachycardia     Ectopy: none     Conduction: normal    MEDICATIONS ORDERED IN ED: Medications  sodium chloride  0.9 % bolus 500 mL (0 mLs Intravenous Stopped 12/06/23 1845)  lactated ringers  bolus 1,000 mL (0 mLs Intravenous Stopped 12/06/23 2201)   IMPRESSION / MDM / ASSESSMENT AND PLAN / ED COURSE  I reviewed the triage vital signs and the nursing notes.                             The patient is on the cardiac monitor to evaluate for evidence of arrhythmia and/or significant heart rate changes. Patient's presentation is most consistent with acute presentation with potential threat to life or bodily function. Patient presents with complaints of syncope/presyncope ED Workup:  CBC, BMP, Troponin, BNP, ECG, CXR Differential diagnosis includes HF, ICH, seizure, stroke, HOCM, ACS, aortic dissection, malignant arrhythmia, or GI bleed. Findings: No evidence of acute laboratory abnormalities.  Troponin creasing from 9-61 EKG: No e/o STEMI. No evidence of Brugadas sign, delta wave, epsilon wave, significantly prolonged QTc, or malignant arrhythmia.  Disposition: Admit to medicine, telemetry bed for cardiac monitoring and cardiology review.   FINAL CLINICAL IMPRESSION(S) / ED DIAGNOSES   Final diagnoses:  Syncope and collapse  Dehydration  Elevated troponin   Rx / DC Orders   ED Discharge Orders     None      Note:  This document was prepared using Dragon voice recognition software and may include unintentional dictation errors.   Decari Duggar K, MD 12/06/23 [phone]

## 2023-12-06 NOTE — ED Notes (Signed)
 White getting orthostatic vital signs pt passed out while in the standing position.  Pt was eased back onto stretcher safely without any injury.

## 2023-12-06 NOTE — ED Notes (Signed)
 While attempting to stand and do ortho's bp pt sts she was feeling like , " everything was getting dark" and had to sit down before completing standing bp.

## 2023-12-07 DIAGNOSIS — R55 Syncope and collapse: Principal | ICD-10-CM

## 2023-12-07 DIAGNOSIS — I5020 Unspecified systolic (congestive) heart failure: Secondary | ICD-10-CM

## 2023-12-07 DIAGNOSIS — R9389 Abnormal findings on diagnostic imaging of other specified body structures: Secondary | ICD-10-CM

## 2023-12-07 LAB — GLUCOSE, CAPILLARY
Glucose-Capillary: 139 mg/dL — ABNORMAL HIGH (ref 70–99)
Glucose-Capillary: 266 mg/dL — ABNORMAL HIGH (ref 70–99)
Glucose-Capillary: 294 mg/dL — ABNORMAL HIGH (ref 70–99)

## 2023-12-07 MED ORDER — NORTRIPTYLINE HCL 25 MG PO CAPS
100.0000 mg | ORAL_CAPSULE | Freq: Every day | ORAL | Status: DC
  Administered 2023-12-07 (×2): 100 mg via ORAL
  Filled 2023-12-07 (×3): qty 4

## 2023-12-07 MED ORDER — GABAPENTIN 300 MG PO CAPS
600.0000 mg | ORAL_CAPSULE | Freq: Every day | ORAL | Status: DC
  Administered 2023-12-07 (×2): 600 mg via ORAL
  Filled 2023-12-07 (×2): qty 2

## 2023-12-07 NOTE — Assessment & Plan Note (Signed)
 Diabetic neuropathy, cervical degenerative disc disease Polypharmacy Cautiously continue nortriptyline , gabapentin  and hydrocodone 

## 2023-12-07 NOTE — Assessment & Plan Note (Signed)
 Continue basal insulin Sliding scale coverage

## 2023-12-07 NOTE — Assessment & Plan Note (Addendum)
 Chest x-ray suggesting fluid overload and possible infiltrate however patient without respiratory symptoms CXR IMPRESSION: 1. Low lung volumes with vascular crowding and streaky bibasilar atelectasis. 2. More significant airspace consolidation in the left lower lobe could suggest infiltrate/pneumonia.  Continue to monitor No antibiotics for now Will continue Lasix  as prescribed by EP

## 2023-12-07 NOTE — Progress Notes (Signed)
 Anticoagulation monitoring(Lovenox ):  43 yo female ordered Lovenox  40 mg Q24h    Filed Weights   12/06/23 1510  Weight: 77.1 kg (170 lb)   BMI 33.2    Lab Results  Component Value Date   CREATININE 0.91 12/06/2023   CREATININE 1.09 (H) 11/07/2023   CREATININE 0.88 10/12/2023   Estimated Creatinine Clearance: 73.9 mL/min (by C-G formula based on SCr of 0.91 mg/dL). Hemoglobin & Hematocrit     Component Value Date/Time   HGB 11.8 (L) 12/06/2023 1518   HGB 11.2 (L) 03/22/2014 0746   HCT 36.5 12/06/2023 1518   HCT 35.9 03/22/2014 0746     Per Protocol for Patient with estCrcl > 30 ml/min and BMI > 30, will transition to Lovenox  37.5 mg Q24h.

## 2023-12-07 NOTE — TOC Initial Note (Signed)
 Transition of Care [facility]) - Initial/Assessment Note    Patient Details  Name: Mary Velasquez MRN: [id number] Date of Birth: 1981-04-18  [facility] CM/SW Contact:    Alexandra Ice, RN Phone Number: 12/07/2023, 1:44 PM  Clinical Narrative:                 Patient admitted for syncopal episode witnessed by daughter. Independent with ADL, has PCP. No discharge needs identified by [facility], will continue to monitor       Patient Goals and CMS Choice            Expected Discharge Plan and Services                                              Prior Living Arrangements/Services                       Activities of Daily Living   ADL Screening (condition at time of admission) Independently performs ADLs?: Yes (appropriate for developmental age) Is the patient deaf or have difficulty hearing?: No Does the patient have difficulty seeing, even when wearing glasses/contacts?: No Does the patient have difficulty concentrating, remembering, or making decisions?: No  Permission Sought/Granted                  Emotional Assessment              Admission diagnosis:  Syncope and collapse [R55] Dehydration [E86.0] Elevated troponin [R79.89] Postural dizziness with presyncope [R42, R55] Patient Active Problem List   Diagnosis Date Noted   HFrEF (heart failure with reduced ejection fraction) (HCC) 12/07/2023   Abnormal chest x-ray 12/07/2023   Postural dizziness with presyncope 12/06/2023   Acute systolic CHF (congestive heart failure) (HCC) 10/12/2023   Hypoxia 10/12/2023   Obesity (BMI 30-39.9) 10/12/2023   NICM (nonischemic cardiomyopathy) (HCC) 10/08/2023   Syncope and collapse, recurrent 10/07/2023   Inappropriate sinus tachycardia (HCC) 10/07/2023   Elevated troponin 10/06/2023   Postoperative state 08/21/2023   Post-operative state 08/21/2023   Normocytic anemia 01/15/2023   Menorrhagia 01/15/2023   Diabetic polyneuropathy  (HCC) 01/15/2023   Syncope 01/15/2023   Sinus tachycardia 01/15/2023   History of arteriovenous malformation (AVM) 01/15/2023   Chronic painful diabetic neuropathy (HCC) 12/06/2020   Peripheral neurogenic pain 12/06/2020   Chronic peripheral neuropathic pain 12/06/2020   DM (diabetes mellitus), type 2 with neurological complications (HCC) 12/06/2020   Abnormal MRI, cervical spine (05/14/2020) 12/06/2020   Abnormal MRI, lumbar spine (11/18/2019) 12/06/2020   Chronic idiopathic constipation 12/05/2020   Dyspepsia 12/05/2020   Gastroesophageal reflux disease without esophagitis 12/05/2020   LUQ pain 12/05/2020   RUQ pain 12/05/2020   Chronic hip pain (Right) 09/27/2020   Chronic low back pain (Bilateral) w/ sciatica (Right) 09/27/2020   Chronic lower extremity pain (Right) 09/27/2020   Foot numbness (Right) 09/27/2020   Loss of sensation of saddle area and right foot 09/27/2020   Lumbar facet hypertrophy 09/27/2020   Paresthesia of lower extremity (Bilateral) 09/27/2020   Neurogenic urinary incontinence 09/27/2020   Multilevel spine pain 09/27/2020   Cervical disc herniation (C3-4, C4-5, C5-6 and C6-7) 09/27/2020   DDD (degenerative disc disease), cervical 09/27/2020   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 [place] [address] Phone: [phone] Fax: [phone]     Social Drivers of Health (SDOH) Social History: SDOH Screenings   Food Insecurity: No Food  Insecurity (12/07/2023)  Housing: Low Risk  (12/07/2023)  Transportation Needs: No Transportation Needs (12/07/2023)  Utilities: Not At Risk (12/07/2023)  Financial Resource Strain: High Risk (10/09/2023)  Tobacco Use: Low Risk  (12/03/2023)   SDOH Interventions:     Readmission Risk Interventions     No data to display

## 2023-12-07 NOTE — H&P (Addendum)
 History and Physical    Patient: Mary Velasquez:[id number] DOB: [birth year] DOA: 12/06/2023 DOS: the patient was seen and examined on 12/07/2023 PCP: Lenell Query, PA-C  Patient coming from: Home  Chief Complaint:  Chief Complaint  Patient presents with   Loss of Consciousness   HPI: Mary Velasquez is a 43 y.o. female with medical history significant of type 2 diabetes, brain AVM, hospitalized from 4/7 to 10/12/2023 with recurrent syncope and new diagnosis of HFrEF secondary to NICM (EF 35%), normal coronaries on LHC on 4/10, most recently evaluated by cardiac electrophysiologist a few days ago on 6/4 with possible diagnosis of arrhythmic syncope, continued on GDMT with plans to reassess LV function in July, who returns to the ED with recurrent syncopal episodes.  Patient states she was standing at home and then according to the daughter who witnessed the episode, she passed out falling onto her left side.  On awaking she felt flushed and had anterior chest pain.  The chest pain has since resolved.  She presented to the ED where she had another syncopal event while they were checking orthostatic vital signs.  It was followed by chest discomfort.  At the time of admission she is sitting comfortably in stretcher with head of bed elevated.  Has mild chest discomfort. Additional ED data review: Blood pressure lying was 138/102 and pulse was 108.  They were unable to obtain orthostatics due to her repeat syncopal event. Labs notable for troponin 10 and BNP 39 CBC and CMP unremarkableEKG EKG showing sinus tachycardia at 107 with borderline prolonged QT interval Chest x-ray concerning for vascular crowding and airspace consolidation left lower lobe as follows IMPRESSION: 1. Low lung volumes with vascular crowding and streaky bibasilar atelectasis. 2. More significant airspace consolidation in the left lower lobe could suggest infiltrate/pneumonia.  Patient was treated with IV fluid  boluses  Hospitalist consulted for admission.  Review of Systems: As mentioned in the history of present illness. All other systems reviewed and are negative. Past Medical History:  Diagnosis Date   Anemia    AVM (arteriovenous malformation)    Diabetes mellitus without complication (HCC)    History of kidney stones    Peripheral neuropathy    Pneumonia    Tachycardia    Past Surgical History:  Procedure Laterality Date   ABDOMINAL SURGERY     BREAST SURGERY     CESAREAN SECTION     x 2   CYSTOSCOPY N/A 08/21/2023   Procedure: CYSTOSCOPY;  Surgeon: Schermerhorn, Joselyn Nicely, MD;  Location: [facility] ORS;  Service: Gynecology;  Laterality: N/A;   ESOPHAGOGASTRODUODENOSCOPY N/A 07/10/2018   Procedure: ESOPHAGOGASTRODUODENOSCOPY (EGD);  Surgeon: Selena Daily, MD;  Location: [facility] ENDOSCOPY;  Service: Gastroenterology;  Laterality: N/A;   ESOPHAGOGASTRODUODENOSCOPY     HYSTERECTOMY ABDOMINAL WITH SALPINGECTOMY Bilateral 08/21/2023   Procedure: HYSTERECTOMY ABDOMINAL WITH SALPINGECTOMY;  Surgeon: Schermerhorn, Joselyn Nicely, MD;  Location: [facility] ORS;  Service: Gynecology;  Laterality: Bilateral;   RIGHT/LEFT HEART CATH AND CORONARY ANGIOGRAPHY N/A 10/09/2023   Procedure: RIGHT/LEFT HEART CATH AND CORONARY ANGIOGRAPHY;  Surgeon: Arleen Lacer, MD;  Location: [facility] INVASIVE CV LAB;  Service: Cardiovascular;  Laterality: N/A;   TUBAL LIGATION     Social History:  reports that she has never smoked. She has never used smokeless tobacco. She reports that she does not currently use drugs. She reports that she does not drink alcohol.  Allergies  Allergen Reactions   Oxycodone Anaphylaxis and Swelling    Throat swelling  Oxycontin [Oxycodone Hcl] Anaphylaxis and Swelling    Throat swelling     Family History  Problem Relation Age of Onset   Heart disease Paternal Aunt    Heart attack Maternal Grandfather    Heart disease Maternal Grandfather    Heart attack Cousin    Heart disease Cousin     CAD Neg Hx    Seizures Neg Hx     Prior to Admission medications   Medication Sig Start Date End Date Taking? Authorizing Provider  Blood Glucose Monitoring Suppl DEVI 1 each by Does not apply route 3 (three) times daily. May dispense any manufacturer covered by patient's insurance. 01/16/23   Garrison Kanner, MD  ferrous sulfate  325 (65 FE) MG EC tablet Take 1 tablet (325 mg total) by mouth 2 (two) times daily. 08/24/23 08/23/24  Schermerhorn, Joselyn Nicely, MD  furosemide  (LASIX ) 40 MG tablet Take 1 tablet (40 mg total) by mouth as needed. 12/03/23   Boyce Byes, MD  gabapentin  (NEURONTIN ) 300 MG capsule Take 600 mg by mouth at bedtime. 08/13/21   [provider]  Glucose Blood (BLOOD GLUCOSE TEST STRIPS) STRP 1 each by Does not apply route 3 (three) times daily. Use as directed to check blood sugar. May dispense any manufacturer covered by patient's insurance and fits patient's device. 01/16/23   Garrison Kanner, MD  insulin  aspart (NOVOLOG ) 100 UNIT/ML FlexPen Inject 4 Units into the skin 3 (three) times daily with meals. Only take if eating a meal AND Blood Glucose (BG) is 80 or higher. Patient taking differently: Inject 8 Units into the skin 3 (three) times daily with meals. Only take if eating a meal AND Blood Glucose (BG) is 80 or higher. 01/16/23   Garrison Kanner, MD  insulin  glargine (LANTUS ) 100 UNIT/ML Solostar Pen Inject 14 Units into the skin at bedtime. May substitute as needed per insurance. Patient taking differently: Inject 18-50 Units into the skin at bedtime. May substitute as needed per insurance. 01/16/23 11/07/23  Garrison Kanner, MD  Insulin  Pen Needle (PEN NEEDLES) 31G X 5 MM MISC 1 each by Does not apply route 3 (three) times daily. May dispense any manufacturer covered by patient's insurance. 01/16/23   Garrison Kanner, MD  Lancet Device MISC 1 each by Does not apply route 3 (three) times daily. May dispense any manufacturer covered by patient's insurance. 01/16/23   Garrison Kanner, MD  Lancets MISC 1  each by Does not apply route 3 (three) times daily. Use as directed to check blood sugar. May dispense any manufacturer covered by patient's insurance and fits patient's device. 01/16/23   Garrison Kanner, MD  metFORMIN  (GLUCOPHAGE ) 1000 MG tablet Take 1,000 mg by mouth 2 (two) times daily with a meal. Breakfast & supper    [provider]  metoprolol  succinate (TOPROL -XL) 25 MG 24 hr tablet Take 1 tablet (25 mg total) by mouth daily. 11/17/23   Charlette Console, FNP  midodrine  (PROAMATINE ) 2.5 MG tablet Take 1 tablet (2.5 mg total) by mouth 2 (two) times daily with a meal. 11/07/23   Charlette Console, FNP  nortriptyline  (PAMELOR ) 50 MG capsule Take 100 mg by mouth at bedtime. 08/13/21   [provider]  spironolactone  (ALDACTONE ) 25 MG tablet Take 0.5 tablets (12.5 mg total) by mouth daily. 11/17/23   Charlette Console, FNP    Physical Exam: Vitals:   12/06/23 1923 12/06/23 1935 12/06/23 2344 12/07/23 0007  BP:  (!) 154/99 (!) 128/100 (!) 160/107  Pulse:   [name])  109 (!) 109  Resp: 20  20 18   Temp: 98 F (36.7 C)  97.8 F (36.6 C) 97.9 F (36.6 C)  TempSrc: Oral  Oral   SpO2: 100%  97% 100%  Weight:      Height:       Physical Exam Vitals and nursing note reviewed.  Constitutional:      General: She is not in acute distress. HENT:     Head: Normocephalic and atraumatic.  Cardiovascular:     Rate and Rhythm: Normal rate and regular rhythm.     Heart sounds: Normal heart sounds.  Pulmonary:     Effort: Pulmonary effort is normal.     Breath sounds: Normal breath sounds.  Abdominal:     Palpations: Abdomen is soft.     Tenderness: There is no abdominal tenderness.  Neurological:     Mental Status: Mental status is at baseline.     Data Reviewed: Relevant notes from primary care and specialist visits, past discharge summaries as available in EHR, including Care Everywhere. Prior diagnostic testing as pertinent to current admission diagnoses Updated medications and problem  lists for reconciliation ED course, including vitals, labs, imaging, treatment and response to treatment Triage notes, nursing and pharmacy notes and ED provider's notes Notable results as noted in HPI   Assessment and Plan: * Syncope and collapse, recurrent Working diagnosis of arrhythmic syncope Patient currently being evaluated by cardiac electrophysiologist-saw Dr. Marven Slimmer on 6/4 with working diagnosis of arrhythmic syncope Neurologic checks with fall and aspiration precautions Cardiology consult  HFrEF (heart failure with reduced ejection fraction) (HCC) NICM EF in March 1930 5% Currently on GDMT with metoprolol , spironolactone  Last EP note reviewed with following plan: -Could not tolerate Jardiance  or losartan  because of hypotension.  -Repeat echo in July and if still low plan for ICD -start taking Lasix  40 mg by mouth once daily for the next 3 days. After the 3 days of Lasix  dosing, she will transition to as needed dosing   Chronic pain syndrome Diabetic neuropathy, cervical degenerative disc disease Polypharmacy Cautiously continue nortriptyline , gabapentin  and hydrocodone   Abnormal chest x-ray Chest x-ray suggesting fluid overload and possible infiltrate however patient without respiratory symptoms CXR IMPRESSION: 1. Low lung volumes with vascular crowding and streaky bibasilar atelectasis. 2. More significant airspace consolidation in the left lower lobe could suggest infiltrate/pneumonia.  Continue to monitor No antibiotics for now Will continue Lasix  as prescribed by EP   History of arteriovenous malformation (AVM) No acute issues suspected  DM (diabetes mellitus), type 2 with neurological complications (HCC) Continue basal insulin  Sliding scale coverage      Advance Care Planning:   Code Status: Full Code   Consults: cardiology [facility]  Family Communication: none  Severity of Illness: The appropriate patient status for this patient is OBSERVATION.  Observation status is judged to be reasonable and necessary in order to provide the required intensity of service to ensure the patient's safety. The patient's presenting symptoms, physical exam findings, and initial radiographic and laboratory data in the context of their medical condition is felt to place them at decreased risk for further clinical deterioration. Furthermore, it is anticipated that the patient will be medically stable for discharge from the hospital within 2 midnights of admission.   Author: Lanetta Pion, MD 12/07/2023 1:19 AM  For on call review [email or web address].

## 2023-12-07 NOTE — Assessment & Plan Note (Addendum)
 NICM EF in March 1930 5% Currently on GDMT with metoprolol , spironolactone  Last EP note reviewed with following plan: -Could not tolerate Jardiance  or losartan  because of hypotension.  -Repeat echo in July and if still low plan for ICD -start taking Lasix  40 mg by mouth once daily for the next 3 days. After the 3 days of Lasix  dosing, she will transition to as needed dosing

## 2023-12-07 NOTE — Plan of Care (Signed)
  Problem: Education: Goal: Knowledge of condition and prescribed therapy will improve Outcome: Progressing   Problem: Cardiac: Goal: Will achieve and/or maintain adequate cardiac output Outcome: Progressing   Problem: Health Behavior/Discharge Planning: Goal: Ability to manage health-related needs will improve Outcome: Progressing   Problem: Clinical Measurements: Goal: Ability to maintain clinical measurements within normal limits will improve Outcome: Progressing Goal: Will remain free from infection Outcome: Progressing Goal: Diagnostic test results will improve Outcome: Progressing Goal: Cardiovascular complication will be avoided Outcome: Progressing   Problem: Coping: Goal: Level of anxiety will decrease Outcome: Progressing

## 2023-12-07 NOTE — Progress Notes (Signed)
 Briefly, patient is a 43 year old female with history of DM 2 and new diagnosis of HFrEF with EF 35% with normal coronaries on LHC, possible diagnosis of arrhythmic syncope who presented to the ED with recurrent syncope.  Patient states she had had 3 episodes of 3 syncope over the past 2 days.  Of note her cardiology had recently increased her Lasix  dose to treat lower extremity edema.  Patient had another episode of syncope while they were checking her orthostatics in the ED.  This morning, patient states she had no symptoms at present, felt comfortable, was awaiting cardiology evaluation.  Physical exam was notable for a well-appearing female lying in bed in NAD.  Assessment and plan: It is possible that patient had syncope due to intravascular volume depletion from the Lasix  although given possible diagnosis of arrhythmogenic syncope, patient will need to be evaluated by cardiology. [facility] cardiology was placed and is pending.  Lasix  was continued as per outpatient regimen.

## 2023-12-07 NOTE — Assessment & Plan Note (Addendum)
 Working diagnosis of arrhythmic syncope Patient currently being evaluated by cardiac electrophysiologist-saw Dr. Marven Slimmer on 6/4 with working diagnosis of arrhythmic syncope Neurologic checks with fall and aspiration precautions Cardiology consult

## 2023-12-07 NOTE — Assessment & Plan Note (Signed)
 No acute issues suspected

## 2023-12-07 NOTE — Plan of Care (Signed)

## 2023-12-08 ENCOUNTER — Telehealth: Payer: Self-pay

## 2023-12-08 ENCOUNTER — Ambulatory Visit: Attending: Physician Assistant

## 2023-12-08 ENCOUNTER — Other Ambulatory Visit: Payer: Self-pay

## 2023-12-08 DIAGNOSIS — R55 Syncope and collapse: Secondary | ICD-10-CM

## 2023-12-08 LAB — CBC
HCT: 33.8 % — ABNORMAL LOW (ref 36.0–46.0)
Hemoglobin: 11.1 g/dL — ABNORMAL LOW (ref 12.0–15.0)
MCH: 27.8 pg (ref 26.0–34.0)
MCHC: 32.8 g/dL (ref 30.0–36.0)
MCV: 84.7 fL (ref 80.0–100.0)
Platelets: 300 10*3/uL (ref 150–400)
RBC: 3.99 MIL/uL (ref 3.87–5.11)
RDW: 13.3 % (ref 11.5–15.5)
WBC: 6.1 10*3/uL (ref 4.0–10.5)
nRBC: 0 % (ref 0.0–0.2)

## 2023-12-08 LAB — BASIC METABOLIC PANEL WITH GFR
Anion gap: 8 (ref 5–15)
BUN: 20 mg/dL (ref 6–20)
CO2: 27 mmol/L (ref 22–32)
Calcium: 8.5 mg/dL — ABNORMAL LOW (ref 8.9–10.3)
Chloride: 102 mmol/L (ref 98–111)
Creatinine, Ser: 0.84 mg/dL (ref 0.44–1.00)
GFR, Estimated: >60 mL/min (ref >60)
Glucose, Bld: 140 mg/dL — ABNORMAL HIGH (ref 70–99)
Potassium: 3.5 mmol/L (ref 3.5–5.1)
Sodium: 137 mmol/L (ref 135–145)

## 2023-12-08 LAB — VITAMIN D 25 HYDROXY (VIT D DEFICIENCY, FRACTURES): Vit D, 25-Hydroxy: 15.92 ng/mL — ABNORMAL LOW (ref 30–100)

## 2023-12-08 LAB — PHOSPHORUS: Phosphorus: 4.2 mg/dL (ref 2.5–4.6)

## 2023-12-08 LAB — VITAMIN B12: Vitamin B-12: 166 pg/mL — ABNORMAL LOW (ref 180–914)

## 2023-12-08 LAB — GLUCOSE, CAPILLARY: Glucose-Capillary: 141 mg/dL — ABNORMAL HIGH (ref 70–99)

## 2023-12-08 LAB — MAGNESIUM: Magnesium: 1.9 mg/dL (ref 1.7–2.4)

## 2023-12-08 MED ORDER — CYANOCOBALAMIN 1000 MCG/ML IJ SOLN
1000.0000 ug | Freq: Once | INTRAMUSCULAR | Status: AC
  Administered 2023-12-08: 1000 ug via INTRAMUSCULAR
  Filled 2023-12-08: qty 1

## 2023-12-08 MED ORDER — VITAMIN D (ERGOCALCIFEROL) 1.25 MG (50000 UNIT) PO CAPS: 50000.0000 [IU] | ORAL_CAPSULE | ORAL | 0 refills | Status: AC

## 2023-12-08 MED ORDER — CYANOCOBALAMIN 1000 MCG PO TABS
1000.0000 ug | ORAL_TABLET | Freq: Every day | ORAL | 1 refills | Status: AC
Start: 2023-12-09 — End: 2024-06-06

## 2023-12-08 MED ORDER — VITAMIN D (ERGOCALCIFEROL) 1.25 MG (50000 UNIT) PO CAPS
50000.0000 [IU] | ORAL_CAPSULE | ORAL | Status: DC
  Administered 2023-12-08: 50000 [IU] via ORAL
  Filled 2023-12-08: qty 1

## 2023-12-08 MED ORDER — MIDODRINE HCL 5 MG PO TABS: 2.5000 mg | ORAL_TABLET | Freq: Two times a day (BID) | ORAL | Status: DC | PRN

## 2023-12-08 MED ORDER — VITAMIN B-12 1000 MCG PO TABS: 1000.0000 ug | ORAL_TABLET | Freq: Every day | ORAL | Status: DC

## 2023-12-08 NOTE — Telephone Encounter (Signed)
 Received message, sent over ZIO for 14 days as requested.

## 2023-12-08 NOTE — Discharge Summary (Signed)
 [facility] Discharge Summary   Patient: Mary Velasquez XBJ:[id number]  PCP: Lenell Query, PA-C  Date of admission: 12/06/2023   Date of discharge:  12/08/2023     Discharge Diagnoses:  Principal Problem:   Syncope and collapse, recurrent Active Problems:   NICM (nonischemic cardiomyopathy) (HCC)   HFrEF (heart failure with reduced ejection fraction) (HCC)   Chronic pain syndrome   Polypharmacy   Abnormal chest x-ray   DM (diabetes mellitus), type 2 with neurological complications (HCC)   Diabetic polyneuropathy (HCC)   History of arteriovenous malformation (AVM)   Admitted From: Home Disposition:  Home   Recommendations for Outpatient Follow-up:  Follow-up with PCP in 1 week,  Follow-up with cardiology in 1 to 2 weeks, Zio patch will be mailed to patient's home for cardiac monitoring. Follow up LABS/TEST:  repeat B12 and vitamin D level after 3 to 6 months   Follow-up Information     [facility]. Go on 12/11/2023.   Specialty: Cardiology Why: Previoious scheduled 1 month f/u @ [facility] 12/11/23 @ 3:00PM Contact information: [address] [place]   [id number] [phone]        Bruce Caper Hestle, PA-C Follow up in 1 week(s).   Specialty: Family Medicine Contact information: [address] [place] [address] [phone]                Diet recommendation: Cardiac and Carb modified diet  Activity: The patient is advised to gradually reintroduce usual activities, as tolerated  Discharge Condition: stable  Code Status: Full code   History of present illness: As per the H and P dictated on admission. Hospital Course:   Mary Velasquez is a 43 y.o. female with medical history significant of type 2 diabetes, brain AVM, hospitalized from 4/7 to 10/12/2023 with recurrent syncope and new diagnosis of HFrEF secondary to NICM (EF 35%), normal  coronaries on LHC on 4/10, most recently evaluated by cardiac electrophysiologist a few days ago on 6/4 with possible diagnosis of arrhythmic syncope, continued on GDMT with plans to reassess LV function in July, who returns to the ED with recurrent syncopal episodes.  Patient states she was standing at home and then according to the daughter who witnessed the episode, she passed out falling onto her left side.  On awaking she felt flushed and had anterior chest pain.  The chest pain has since resolved.  She presented to the ED where she had another syncopal event while they were checking orthostatic vital signs.  It was followed by chest discomfort.  At the time of admission she is sitting comfortably in stretcher with head of bed elevated.  Has mild chest discomfort. Additional ED data review: Blood pressure lying was 138/102 and pulse was 108.  They were unable to obtain orthostatics due to her repeat syncopal event. Labs notable for troponin 10 and BNP 39 CBC and CMP unremarkableEKG EKG showing sinus tachycardia at 107 with borderline prolonged QT interval Chest x-ray concerning for vascular crowding and airspace consolidation left lower lobe as follows IMPRESSION: 1. Low lung volumes with vascular crowding and streaky bibasilar atelectasis. 2. More significant airspace consolidation in the left lower lobe could suggest infiltrate/pneumonia.   Patient was treated with IV fluid boluses Hospitalist consulted for admission.    Assessment and Plan:   # Syncope and collapse, recurrent # Working diagnosis of arrhythmic syncope Patient currently being evaluated by cardiac electrophysiologist-saw Dr. Marven Slimmer on  6/4 with working diagnosis of arrhythmic syncope Cardiology consulted, syncope likely orthostatic hypotension due to recent furosemide  use.  Now she is back to her baseline, continue midodrine  2.5 mg as needed.  Cautiously use furosemide  as needed for lower extremity edema.  May use knee-high  stocking. Due to cardiomyopathy recommended to eval for arrhythmias, recommended Zio patch for 2 weeks which will be mailed to patient's home.  Continue small dose Toprol  XL and spironolactone .   HFrEF (heart failure with reduced ejection fraction) NICM, EF in March 1930 5% Currently on GDMT with metoprolol , spironolactone  Last EP note reviewed with following plan: -Could not tolerate Jardiance  or losartan  because of hypotension.  -Repeat echo in July and if still low plan for ICD -start taking Lasix  40 mg by mouth once daily for the next 3 days. After the 3 days of Lasix  dosing, she will transition to as needed dosing.     # Chronic pain syndrome # Diabetic neuropathy, cervical degenerative disc disease Polypharmacy Cautiously continue nortriptyline , gabapentin  and hydrocodone    # Abnormal chest x-ray Chest x-ray suggesting fluid overload and possible infiltrate however patient without respiratory symptoms CXR IMPRESSION: Low lung volumes with vascular crowding and streaky bibasilar atelectasis. More significant airspace consolidation in the left lower lobe could suggest infiltrate/pneumonia. No indication of antibiotics at this time.  Continue Lasix  as prescribed by EP.    # History of arteriovenous malformation (AVM) No acute issues suspected   # DM (diabetes mellitus), type 2 with neurological complications  Continued home dose insulin  and medications.  Continue diabetic diet and monitor CBG at home. F/u with PCP  # Vitamin D deficiency: V.D level 16, goal >50, started vitamin D 50,000 units p.o. weekly, follow with PCP to repeat vitamin D level after 3 to 6 months.  # Vitamin B12 deficiency: B12 level 116, goal >400.  Started vitamin B12 1000 mcg IM injection daily during hospital stay, followed by oral supplement.  Follow-up PCP to repeat vitamin B12 level after 3 to 6 months.   Body mass index is 33.54 kg/m.  Nutrition Interventions:   - Patient was instructed, not to  drive, operate heavy machinery, perform activities at heights, swimming or participation in water  activities or provide baby sitting services while on Pain, Sleep and Anxiety Medications; until her outpatient Physician has advised to do so again.  - Also recommended to not to take more than prescribed Pain, Sleep and Anxiety Medications.  Patient was ambulatory without any assistance. On the day of the discharge the patient's vitals were stable, and no other acute medical condition were reported by patient. the patient was felt safe to be discharge at Home.  Consultants: Cardiology Procedures: None  Discharge Exam: General: Appear in no distress, no Rash; Oral Mucosa Clear, moist. Cardiovascular: S1 and S2 Present, no Murmur, Respiratory: normal respiratory effort, Bilateral Air entry present and no Crackles, no wheezes Abdomen: Bowel Sound present, Soft and no tenderness, no hernia Extremities: no Pedal edema, no calf tenderness Neurology: alert and oriented to time, place, and person affect appropriate.  Filed Weights   12/07/23 0007 12/07/23 0500 12/08/23 0500  Weight: 78.3 kg 79 kg 77.9 kg   Vitals:   12/08/23 1159 12/08/23 1436  BP: (!) 136/101 (!) 123/94  Pulse: 96 97  Resp: 16 18  Temp: 98.5 F (36.9 C) 98.4 F (36.9 C)  SpO2: 98% 100%    DISCHARGE MEDICATION: Allergies as of 12/08/2023       Reactions   Oxycodone Anaphylaxis, Swelling  Throat swelling   Oxycontin [oxycodone Hcl] Anaphylaxis, Swelling   Throat swelling        Medication List     TAKE these medications    Blood Glucose Monitoring Suppl Devi 1 each by Does not apply route 3 (three) times daily. May dispense any manufacturer covered by patient's insurance.   BLOOD GLUCOSE TEST STRIPS Strp 1 each by Does not apply route 3 (three) times daily. Use as directed to check blood sugar. May dispense any manufacturer covered by patient's insurance and fits patient's device.   cyanocobalamin  1000 MCG  tablet Take 1 tablet (1,000 mcg total) by mouth daily. Start taking on: December 09, 2023   ferrous sulfate  325 (65 FE) MG EC tablet Take 1 tablet (325 mg total) by mouth 2 (two) times daily.   furosemide  40 MG tablet Commonly known as: LASIX  Take 1 tablet (40 mg total) by mouth as needed.   gabapentin  300 MG capsule Commonly known as: NEURONTIN  Take 600 mg by mouth at bedtime.   insulin  aspart 100 UNIT/ML FlexPen Commonly known as: NOVOLOG  Inject 4 Units into the skin 3 (three) times daily with meals. Only take if eating a meal AND Blood Glucose (BG) is 80 or higher. What changed: how much to take   insulin  glargine 100 UNIT/ML Solostar Pen Commonly known as: LANTUS  Inject 14 Units into the skin at bedtime. May substitute as needed per insurance. What changed: how much to take   Lancet Device Misc 1 each by Does not apply route 3 (three) times daily. May dispense any manufacturer covered by patient's insurance.   Lancets Misc 1 each by Does not apply route 3 (three) times daily. Use as directed to check blood sugar. May dispense any manufacturer covered by patient's insurance and fits patient's device.   metFORMIN  1000 MG tablet Commonly known as: GLUCOPHAGE  Take 1,000 mg by mouth 2 (two) times daily with a meal. Breakfast & supper   metoprolol  succinate 25 MG 24 hr tablet Commonly known as: TOPROL -XL Take 1 tablet (25 mg total) by mouth daily.   midodrine  2.5 MG tablet Commonly known as: PROAMATINE  Take 1 tablet (2.5 mg total) by mouth 2 (two) times daily with a meal.   nortriptyline  50 MG capsule Commonly known as: PAMELOR  Take 100 mg by mouth at bedtime.   Pen Needles 31G X 5 MM Misc 1 each by Does not apply route 3 (three) times daily. May dispense any manufacturer covered by patient's insurance.   spironolactone  25 MG tablet Commonly known as: ALDACTONE  Take 0.5 tablets (12.5 mg total) by mouth daily.   Vitamin D (Ergocalciferol) 1.25 MG (50000 UNIT) Caps  capsule Commonly known as: DRISDOL Take 1 capsule (50,000 Units total) by mouth every 7 (seven) days.       Allergies  Allergen Reactions   Oxycodone Anaphylaxis and Swelling    Throat swelling   Oxycontin [Oxycodone Hcl] Anaphylaxis and Swelling    Throat swelling    Discharge Instructions     Call MD for:   Complete by: As directed    Palpitations or chest pain   Call MD for:  difficulty breathing, headache or visual disturbances   Complete by: As directed    Call MD for:  extreme fatigue   Complete by: As directed    Call MD for:  persistant dizziness or light-headedness   Complete by: As directed    Call MD for:  persistant nausea and vomiting   Complete by: As directed  Call MD for:  severe uncontrolled pain   Complete by: As directed    Call MD for:  temperature >100.4   Complete by: As directed    Diet - low sodium heart healthy   Complete by: As directed    Discharge instructions   Complete by: As directed    Follow-up with PCP in 1 week, repeat B12 and vitamin D level after 3 to 6 months Follow-up with cardiology in 1 to 2 weeks, Zio patch will be mailed to patient's home for cardiac monitoring.   Increase activity slowly   Complete by: As directed    No wound care   Complete by: As directed        The results of significant diagnostics from this hospitalization (including imaging, microbiology, ancillary and laboratory) are listed below for reference.    Significant Diagnostic Studies: DG Chest Port 1 View Result Date: 12/06/2023 CLINICAL DATA:  Chest pain.  Syncopal episode. EXAM: PORTABLE CHEST 1 VIEW COMPARISON:  12/13/2021 FINDINGS: The cardiac silhouette, mediastinal and hilar contours are within normal limits. Low lung volumes with vascular crowding and streaky bibasilar atelectasis. More significant airspace consolidation in the left lower lobe could suggest infiltrate/pneumonia. Recommend correlation with clinical findings. The bony thorax is  intact. IMPRESSION: 1. Low lung volumes with vascular crowding and streaky bibasilar atelectasis. 2. More significant airspace consolidation in the left lower lobe could suggest infiltrate/pneumonia. Electronically Signed   By: Marrian Siva M.D.   On: 12/06/2023 15:55    Microbiology: No results found for this or any previous visit (from the past 240 hours).   Labs: CBC: Recent Labs  Lab 12/06/23 1518 12/08/23 0857  WBC 7.1 6.1  NEUTROABS 4.1  --   HGB 11.8* 11.1*  HCT 36.5 33.8*  MCV 85.9 84.7  PLT 294 300   Basic Metabolic Panel: Recent Labs  Lab 12/06/23 1518 12/08/23 0857  NA 138 137  K 3.9 3.5  CL 101 102  CO2 28 27  GLUCOSE 95 140*  BUN 19 20  CREATININE 0.91 0.84  CALCIUM  8.6* 8.5*  MG  --  1.9  PHOS  --  4.2   Liver Function Tests: Recent Labs  Lab 12/06/23 1518  AST 17  ALT 8  ALKPHOS 98  BILITOT 0.5  PROT 7.4  ALBUMIN  2.8*   No results for input(s): "LIPASE", "AMYLASE" in the last 168 hours. No results for input(s): "AMMONIA" in the last 168 hours. Cardiac Enzymes: No results for input(s): "CKTOTAL", "CKMB", "CKMBINDEX", "TROPONINI" in the last 168 hours. BNP (last 3 results) Recent Labs    10/06/23 1013 12/06/23 1518  BNP 235.0* 39.6   CBG: Recent Labs  Lab 12/07/23 0049 12/07/23 0456 12/07/23 2044 12/08/23 0613  GLUCAP 266* 139* 294* 141*    Time spent: 35 minutes  Signed:  Althia Atlas  [facility] 12/08/2023 4:07 PM

## 2023-12-08 NOTE — Consult Note (Signed)
 Cardiology Consultation   Patient ID: Mary Velasquez MRN: [id number]; DOB: 1981/05/22  Admit date: 12/06/2023 Date of Consult: 12/08/2023  PCP:  Lenell Query, PA-C   [facility] Providers Cardiologist:  Sammy Crisp, MD  Electrophysiologist:  Boyce Byes, MD       Patient Profile: Mary Velasquez is a 43 y.o. female with a hx of type 2 diabetes, inappropriate sinus tachycardia, brain AVM, and HFrEF who is being seen 12/08/2023 for the evaluation of recurrent syncope at the request of Dr. Leann Prom.  History of Present Illness: [name] was seen by and established with Dr. Junnie Olives 09/2022 evaluation of elevated blood pressure and tachycardia.  She had ongoing elevated heart rate for 1 year prior and positive orthostatics with BP dropping to 80 systolic on standing.  She was started on ivabradine  for inappropriate sinus tachycardia.  Echo 10/2022 showed EF 60 to 65% without RWMA, mild LVH, G1 DD, normal RV systolic function and size, small pericardial effusion present, with RA pressure 3 mmHg.  She had an unwitnessed episode 10/05/2023 with lightheadedness and dizziness.  Noted several falls with possible syncope.  She had a second syncopal episode on 10/06/2023 which was preceded by lightheadedness.  Echo at that time showed EF 30 to 35%, with global hypokinesis.  R/LHC showed normal coronary arteries, mild to moderate pulmonary hypertension with severely elevated LV pressure.  She was started on GDMT.  Cardiac MRI showed EF 35% with no LGE or scar and no evidence for infiltrative or inflammatory disease.  Seen by [facility] heart failure clinic with discontinuation of Jardiance  and losartan  secondary to hypotension.  She was also started on midodrine  as needed.  Patient was most recently seen by Dr. Marven Slimmer 12/03/2023 and noted increased swelling in her lower extremities.  Recommendation for reassessment of LV function 3 months after hospitalization.  If EF 35% or less, plan  for proceeding with subcutaneous ICD placement.  Patient was also recommended to start Lasix  40 mg once daily for the next 3 days.  Patient reports that prior to admission, she had been feeling well from a cardiac perspective.  She had been without lightheadedness and dizziness. She has midodrine  available PRN at home and she takes this when her systolic BP reads < 100 mmHg. She has not needed to take this recently. However, on Saturday after using the bathroom, she got up to walk around the corner into a different room when she started feeling lightheaded.  Her daughter witnessed a syncopal episode.  She did not hit her head.  She denies associated chest pain, diaphoresis, palpitations, and shortness of breath.  EMS was called and found patient to be orthostatic.  She was given 50 cc of crystalloid.  In the ED, BP elevated at 154/99 with otherwise normal vital signs.  Labs notable for albumin  2.8.  Troponin minimally elevated, peaking at 61.  EKG shows sinus tachycardia without acute ischemic changes.  Chest x-ray shows low lung volumes with vascular crowding and streaky bibasilar atelectasis and more significant airspace consolidation in the left lower lobe suggesting infiltrate/pneumonia.  Orthostatic vital signs were attempted to be taken, and upon standing, the patient had another episode of syncope.  Cardiology was asked to consult for further evaluation of recurrent syncope.  On my exam, patient is without chest pain, shortness of breath, lightheadedness, dizziness, lower extremity swelling, orthopnea, and PND.  Past Medical History:  Diagnosis Date   Anemia    AVM (arteriovenous malformation)    Diabetes mellitus  without complication (HCC)    History of kidney stones    Peripheral neuropathy    Pneumonia    Tachycardia     Past Surgical History:  Procedure Laterality Date   ABDOMINAL SURGERY     BREAST SURGERY     CESAREAN SECTION     x 2   CYSTOSCOPY N/A 08/21/2023   Procedure:  CYSTOSCOPY;  Surgeon: Schermerhorn, Joselyn Nicely, MD;  Location: [facility] ORS;  Service: Gynecology;  Laterality: N/A;   ESOPHAGOGASTRODUODENOSCOPY N/A 07/10/2018   Procedure: ESOPHAGOGASTRODUODENOSCOPY (EGD);  Surgeon: Selena Daily, MD;  Location: [facility] ENDOSCOPY;  Service: Gastroenterology;  Laterality: N/A;   ESOPHAGOGASTRODUODENOSCOPY     HYSTERECTOMY ABDOMINAL WITH SALPINGECTOMY Bilateral 08/21/2023   Procedure: HYSTERECTOMY ABDOMINAL WITH SALPINGECTOMY;  Surgeon: Schermerhorn, Joselyn Nicely, MD;  Location: [facility] ORS;  Service: Gynecology;  Laterality: Bilateral;   RIGHT/LEFT HEART CATH AND CORONARY ANGIOGRAPHY N/A 10/09/2023   Procedure: RIGHT/LEFT HEART CATH AND CORONARY ANGIOGRAPHY;  Surgeon: Arleen Lacer, MD;  Location: [facility] INVASIVE CV LAB;  Service: Cardiovascular;  Laterality: N/A;   TUBAL LIGATION         Scheduled Meds:  enoxaparin  (LOVENOX ) injection  0.5 mg/kg Subcutaneous Q24H   gabapentin   600 mg Oral QHS   insulin  glargine-yfgn  10 Units Subcutaneous QHS   metoprolol  succinate  25 mg Oral Daily   midodrine   2.5 mg Oral BID WC   nortriptyline   100 mg Oral QHS   sodium chloride  flush  3 mL Intravenous Q12H   spironolactone   12.5 mg Oral Daily   Continuous Infusions:  PRN Meds: acetaminophen  **OR** acetaminophen , furosemide , HYDROcodone -acetaminophen , ondansetron  **OR** ondansetron  (ZOFRAN ) IV  Allergies:    Allergies  Allergen Reactions   Oxycodone Anaphylaxis and Swelling    Throat swelling   Oxycontin [Oxycodone Hcl] Anaphylaxis and Swelling    Throat swelling     Social History:   Social History   Socioeconomic History   Marital status: Single    Spouse name: Not on file   Number of children: 2   Years of education: Not on file   Highest education level: Some college, no degree  Occupational History   Not on file  Tobacco Use   Smoking status: Never   Smokeless tobacco: Never  Vaping Use   Vaping status: Never Used  Substance and Sexual Activity    Alcohol use: No   Drug use: Not Currently   Sexual activity: Not on file  Other Topics Concern   Not on file  Social History Narrative   Lives alone   Social Drivers of Health   Financial Resource Strain: High Risk (10/09/2023)   Overall Financial Resource Strain (CARDIA)    Difficulty of Paying Living Expenses: Hard  Food Insecurity: No Food Insecurity (12/07/2023)   Hunger Vital Sign    Worried About Running Out of Food in the Last Year: Never true    Ran Out of Food in the Last Year: Never true  Transportation Needs: No Transportation Needs (12/07/2023)   PRAPARE - Administrator, Civil Service (Medical): No    Lack of Transportation (Non-Medical): No  Physical Activity: Not on file  Stress: Not on file  Social Connections: Not on file  Intimate Partner Violence: Not At Risk (12/07/2023)   Humiliation, Afraid, Rape, and Kick questionnaire    Fear of Current or Ex-Partner: No    Emotionally Abused: No    Physically Abused: No    Sexually Abused: No    Family History:  Family History  Problem Relation Age of Onset   Heart disease Paternal Aunt    Heart attack Maternal Grandfather    Heart disease Maternal Grandfather    Heart attack Cousin    Heart disease Cousin    CAD Neg Hx    Seizures Neg Hx      ROS:  Please see the history of present illness.    Physical Exam/Data: Vitals:   12/07/23 2331 12/08/23 0341 12/08/23 0500 12/08/23 0828  BP: (!) 137/98 116/79  (!) 157/120  Pulse: (!) 102 91  (!) 102  Resp: 20 20  18   Temp: 98.3 F (36.8 C) 97.8 F (36.6 C)  98 F (36.7 C)  TempSrc:      SpO2: 100% 99%  100%  Weight:   77.9 kg   Height:       No intake or output data in the 24 hours ending 12/08/23 1025    12/08/2023    5:00 AM 12/07/2023    5:00 AM 12/07/2023   12:07 AM  Last 3 Weights  Weight (lbs) 171 lb 11.8 oz 174 lb 2.6 oz 172 lb 11.2 oz  Weight (kg) 77.9 kg 79 kg 78.336 kg     Body mass index is 33.54 kg/m.  General:  Well nourished,  well developed, in no acute distress HEENT: normal Neck: no JVD Vascular: No carotid bruits; Distal pulses 2+ bilaterally Cardiac:  normal S1, S2; RRR; no murmur  Lungs:  clear to auscultation bilaterally, no wheezing, rhonchi or rales  Abd: soft, nontender, no hepatomegaly  Ext: no edema Skin: warm and dry  Psych:  Normal affect   EKG:  The EKG was personally reviewed and demonstrates:  sinus tachycardia, rate 107 bpm Telemetry:  Telemetry was personally reviewed and demonstrates:  sinus tachycardia, rate 100-115 bpm  Relevant CV Studies:  10/10/2023 Cardiac MRI 1. Normal LV size, moderately reduced LV systolic function. LVEF 35%. 2.  No LGE or scar. 3.  No evidence for infiltrative or inflammatory disease. 4.  Mildly reduced RV systolic function. 5.  No significant valvular abnormalities. 6.  Findings suggest non ischemic cardiomyopathy.  10/09/2023 R/LHC Angiographically normal coronary arteries with a right dominant system  Mild to moderate WHO Class II Pulm Hypertension: RAP mean 10 mmHg, RV P-EDP 42/6-21 mmHg PAP-mean 43/21-32 millimercury; PCWP mean more consistent with 28 mmHg. LV P-EDP 136/2-36 mmHg; AoP-MAP 134/88-108 mmHg. AO sat 88%, PA sat 57% on room air. Fick cardiac output-index 5.08-2.89. PVR 1 89D/S; index through 3 32D/SI  10/07/2023 Echo complete  1. Left ventricular ejection fraction, by estimation, is 30 to 35%. The  left ventricle has moderately decreased function. The left ventricle  demonstrates global hypokinesis. There is mild left ventricular  hypertrophy. Indeterminate diastolic filling due   to E-A fusion. The average left ventricular global longitudinal strain is  -6.6 %. The global longitudinal strain is abnormal.   2. Right ventricular systolic function is mildly reduced. The right  ventricular size is normal. There is moderately elevated pulmonary artery  systolic pressure.   3. The mitral valve is normal in structure. Moderate mitral valve   regurgitation. No evidence of mitral stenosis.   4. Tricuspid valve regurgitation is mild to moderate.   5. The aortic valve is tricuspid. Aortic valve regurgitation is mild.   6. The inferior vena cava is dilated in size with <50% respiratory  variability, suggesting right atrial pressure of 15 mmHg.   Laboratory Data: High Sensitivity Troponin:   Recent Labs  Lab 12/06/23 1518 12/06/23 1719 12/06/23 2236  TROPONINIHS 9 61* 10     Chemistry Recent Labs  Lab 12/06/23 1518 12/08/23 0857  NA 138 137  K 3.9 3.5  CL 101 102  CO2 28 27  GLUCOSE 95 140*  BUN 19 20  CREATININE 0.91 0.84  CALCIUM  8.6* 8.5*  MG  --  1.9  GFRNONAA >60 >60  ANIONGAP 9 8    Recent Labs  Lab 12/06/23 1518  PROT 7.4  ALBUMIN  2.8*  AST 17  ALT 8  ALKPHOS 98  BILITOT 0.5   Lipids No results for input(s): "CHOL", "TRIG", "HDL", "LABVLDL", "LDLCALC", "CHOLHDL" in the last 168 hours.  Hematology Recent Labs  Lab 12/06/23 1518 12/08/23 0857  WBC 7.1 6.1  RBC 4.25 3.99  HGB 11.8* 11.1*  HCT 36.5 33.8*  MCV 85.9 84.7  MCH 27.8 27.8  MCHC 32.3 32.8  RDW 13.5 13.3  PLT 294 300   Thyroid  No results for input(s): "TSH", "FREET4" in the last 168 hours.  BNP Recent Labs  Lab 12/06/23 1518  BNP 39.6    DDimer No results for input(s): "DDIMER" in the last 168 hours.  Radiology/Studies:  [facility] Chest Port 1 View Result Date: 12/06/2023 IMPRESSION: 1. Low lung volumes with vascular crowding and streaky bibasilar atelectasis. 2. More significant airspace consolidation in the left lower lobe could suggest infiltrate/pneumonia. Electronically Signed   By: Marrian Siva M.D.   On: 12/06/2023 15:55   Assessment and Plan:  Recurrent syncope Orthostatic hypotension - Prior admission 09/2023 for syncope. Echo with newly reduced EF 30-35%, cath with normal coronary arteries, and cMRI without LGE/scar and no sign of infiltrative/inflammatory disease. Outpatient evaluation by EP with recommendation for  repeat echo 12/2023 and possible ICD placement if EF remains low.  - Recent increase in Lasix  dose on 6/4 due to LE edema - Presenting with recurrent syncope preceded by lightheadedness - Nursing staff attempted orthostatic vital signs with repeat episode of syncope - No arrhythmia noted on telemetry - Cause of syncope is likely orthostasis in the setting of hypovolemia with increased dose of Lasix  - Continue midodrine  2.5 mg as needed for SBP < 110 mmHg - Recommend ZIO monitor at discharge to evaluate for arrhythmia  HFrEF Nonischemic cardiomyopathy - Recent admission outlined above. Echo at that time showed EF 30-35%.  - Did not tolerate Jardiance  or losartan  due to hypotension - Seen by EP 6/4 and noted to have lower extremity swelling. Lasix  dosing was increased to 40 mg daily x 3 days.  - Appears euvolemic on exam - Repeat limited echo  - Continue metoprolol  succinate 25 mg daily, spironolactone  12.5 mg daily, and Lasix  40 mg daily as needed - Consider reintroduction of ARB if BP remains elevated. GDMT previously limited by hypotension.   Abnormal CXR - CXR with low lung volumes with vascular crowding and streaky bibasilar atelectasis and more significant airspace consolidation in the left lower lobe suggesting infiltrate/pneumonia - Patient reporting cough - Management per IM   For questions or updates, please contact [facility] Please consult [email or web address] for contact info under    Signed, Brodie Cannon, PA-C  12/08/2023 10:25 AM

## 2023-12-08 NOTE — Telephone Encounter (Signed)
-----   Message from Brodie Cannon sent at 12/08/2023  3:28 PM EDT ----- This patient will be discharged from [facility] today. Will you please have a 14 day ZIO AT for syncope mailed to her? Thank you!

## 2023-12-08 NOTE — Plan of Care (Signed)
  Problem: Education: Goal: Knowledge of condition and prescribed therapy will improve Outcome: Adequate for Discharge   Problem: Cardiac: Goal: Will achieve and/or maintain adequate cardiac output Outcome: Adequate for Discharge   Problem: Physical Regulation: Goal: Complications related to the disease process, condition or treatment will be avoided or minimized Outcome: Adequate for Discharge   Problem: Education: Goal: Knowledge of General Education information will improve Description: Including pain rating scale, medication(s)/side effects and non-pharmacologic comfort measures Outcome: Adequate for Discharge   Problem: Health Behavior/Discharge Planning: Goal: Ability to manage health-related needs will improve Outcome: Adequate for Discharge   Problem: Clinical Measurements: Goal: Ability to maintain clinical measurements within normal limits will improve Outcome: Adequate for Discharge Goal: Will remain free from infection Outcome: Adequate for Discharge Goal: Diagnostic test results will improve Outcome: Adequate for Discharge Goal: Respiratory complications will improve Outcome: Adequate for Discharge Goal: Cardiovascular complication will be avoided Outcome: Adequate for Discharge   Problem: Activity: Goal: Risk for activity intolerance will decrease Outcome: Adequate for Discharge   Problem: Nutrition: Goal: Adequate nutrition will be maintained Outcome: Adequate for Discharge   Problem: Coping: Goal: Level of anxiety will decrease Outcome: Adequate for Discharge   Problem: Elimination: Goal: Will not experience complications related to bowel motility Outcome: Adequate for Discharge Goal: Will not experience complications related to urinary retention Outcome: Adequate for Discharge   Problem: Pain Managment: Goal: General experience of comfort will improve and/or be controlled Outcome: Adequate for Discharge   Problem: Safety: Goal: Ability to  remain free from injury will improve Outcome: Adequate for Discharge   Problem: Skin Integrity: Goal: Risk for impaired skin integrity will decrease Outcome: Adequate for Discharge

## 2023-12-08 NOTE — Plan of Care (Signed)
  Problem: Education: Goal: Knowledge of condition and prescribed therapy will improve Outcome: Progressing   Problem: Cardiac: Goal: Will achieve and/or maintain adequate cardiac output Outcome: Progressing   Problem: Education: Goal: Knowledge of General Education information will improve Description: Including pain rating scale, medication(s)/side effects and non-pharmacologic comfort measures Outcome: Progressing   Problem: Health Behavior/Discharge Planning: Goal: Ability to manage health-related needs will improve Outcome: Progressing

## 2023-12-08 NOTE — Progress Notes (Signed)
 Patient being discharged at this time.  No s/s of distress noted.  Denies pain.  B12 and vit D given prior to discharge.  All belongings packed and taken with patient to daughters car.  Educated on AVS and instructed on follow up visits and medications.

## 2023-12-09 ENCOUNTER — Encounter: Admitting: Cardiology

## 2023-12-10 ENCOUNTER — Telehealth: Payer: Self-pay | Admitting: Family

## 2023-12-10 NOTE — Telephone Encounter (Signed)
 Called to confirm/remind patient of their appointment at the [facility] on 12/11/23.   Appointment:   [x] Confirmed  [] Left mess   [] No answer/No voice mail  [] VM Full/unable to leave message  [] Phone not in service  Patient reminded to bring all medications and/or complete list.  Confirmed patient has transportation. Gave directions, instructed to utilize valet parking.

## 2023-12-11 ENCOUNTER — Ambulatory Visit: Attending: Family | Admitting: Family

## 2023-12-11 ENCOUNTER — Encounter: Payer: Self-pay | Admitting: Family

## 2023-12-11 VITALS — BP 105/83 | HR 112 | Wt 172.2 lb

## 2023-12-11 DIAGNOSIS — Z7984 Long term (current) use of oral hypoglycemic drugs: Secondary | ICD-10-CM | POA: Diagnosis not present

## 2023-12-11 DIAGNOSIS — Q282 Arteriovenous malformation of cerebral vessels: Secondary | ICD-10-CM | POA: Diagnosis not present

## 2023-12-11 DIAGNOSIS — D6489 Other specified anemias: Secondary | ICD-10-CM | POA: Diagnosis not present

## 2023-12-11 DIAGNOSIS — Z9889 Other specified postprocedural states: Secondary | ICD-10-CM | POA: Diagnosis not present

## 2023-12-11 DIAGNOSIS — Z794 Long term (current) use of insulin: Secondary | ICD-10-CM | POA: Insufficient documentation

## 2023-12-11 DIAGNOSIS — E119 Type 2 diabetes mellitus without complications: Secondary | ICD-10-CM

## 2023-12-11 DIAGNOSIS — E1140 Type 2 diabetes mellitus with diabetic neuropathy, unspecified: Secondary | ICD-10-CM | POA: Insufficient documentation

## 2023-12-11 DIAGNOSIS — I428 Other cardiomyopathies: Secondary | ICD-10-CM | POA: Insufficient documentation

## 2023-12-11 DIAGNOSIS — R55 Syncope and collapse: Secondary | ICD-10-CM | POA: Insufficient documentation

## 2023-12-11 DIAGNOSIS — E669 Obesity, unspecified: Secondary | ICD-10-CM | POA: Insufficient documentation

## 2023-12-11 DIAGNOSIS — I5022 Chronic systolic (congestive) heart failure: Secondary | ICD-10-CM | POA: Diagnosis not present

## 2023-12-11 DIAGNOSIS — D649 Anemia, unspecified: Secondary | ICD-10-CM

## 2023-12-11 DIAGNOSIS — I4711 Inappropriate sinus tachycardia, so stated: Secondary | ICD-10-CM | POA: Insufficient documentation

## 2023-12-11 DIAGNOSIS — R5383 Other fatigue: Secondary | ICD-10-CM | POA: Diagnosis present

## 2023-12-11 DIAGNOSIS — Z79899 Other long term (current) drug therapy: Secondary | ICD-10-CM | POA: Insufficient documentation

## 2023-12-11 NOTE — Patient Instructions (Addendum)
 It was good to see you today! Medication Changes:  No medication changes today!  Follow-Up in: Please follow up with the [facility] in 2 months with Shawnee Dellen, FNP.  At the [facility], you and your health needs are our priority. We have a designated team specialized in the treatment of Heart Failure. This Care Team includes your primary Heart Failure Specialized Cardiologist (physician), Advanced Practice Providers (APPs- Physician Assistants and Nurse Practitioners), and Pharmacist who all work together to provide you with the care you need, when you need it.   You may see any of the following providers on your designated Care Team at your next follow up:  Dr. Jules Oar Dr. Peder Bourdon Dr. Alwin Baars Dr. Judyth Nunnery Shawnee Dellen, FNP Bevely Brush, RPH-CPP  Please be sure to bring in all your medications bottles to every appointment.   Need to Contact Us :  If you have any questions or concerns before your next appointment please send us  a message through [place] or call our office at [phone].    TO LEAVE A MESSAGE FOR THE NURSE SELECT OPTION 2, PLEASE LEAVE A MESSAGE INCLUDING: YOUR NAME DATE OF BIRTH CALL BACK NUMBER REASON FOR CALL**this is important as we prioritize the call backs  YOU WILL RECEIVE A CALL BACK THE SAME DAY AS LONG AS YOU CALL BEFORE 4:00 PM

## 2023-12-11 NOTE — Progress Notes (Signed)
 Advanced [facility] Failure Clinic Note    PCP: Bruce Caper PA @ [facility] (to be seen 05/25) Cardiologist: Sammy Crisp, MD   Chief Complaint: fatigue   HPI:  Mary Velasquez is a 43 y/o female with a history of type 2 diabetes, recent hysterectomy complicated by post-op anemia requiring blood transfusion, inappropriate sinus tachycardia, brain AVM, obesity, anemia, peripheral neuropathy and chronic heart failure. Has paternal first cousin who died suddenly in her 30s from cardiac arrest.    Echo 10/2022 showed EF 60-65% without RWMA, mild LVH, G1DD, normal RV systolic function and sive, small pericardial effusion present, with RA pressure 3 mmHg.   Admitted 10/06/23 with dizziness and syncope. 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 When she came to, she was laying on the ground and the resident was yelling her name. BNP was 235, HS-troponin was 35. CTA chest was negative for PE, but showed cardiomegaly, pulmonary edema, small bilateral pleural effusions, and dilated pulmonary artery. 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 Echocardiogram 10/07/23: LVEF 30-35%, mild LVH, GLS of -6.6%, RV function mildly reduced with moderately elevate PA systolic pressure, moderate MR, mild AR, moderate TR. EP consulted. Right heart cath 10/09/23: showed mild to moderate WHO class II pulmonary hypertension. IV Lasix  has been discontinued due to contraction alkalosis and hypotension. Lifevest placed prior to discharge.   cMRI 10/10/23:  IMPRESSION: 1. Normal LV size, moderately reduced LV systolic function. LVEF 35%.  2.  No LGE or scar.  3.  No evidence for infiltrative or inflammatory disease.  4.  Mildly reduced RV systolic function.  5.  No significant valvular abnormalities.  6.  Findings suggest non ischemic cardiomyopathy  Seen in [facility] 04/25 and jardiance  was started and losartan , metoprolol  and spironolactone  were resumed as she  had been out of RX.  Admitted 12/06/23 due to syncopal episode at home. She had another syncopal episode while in the ED. EKG showed ST with borderline prolonged QT interval. Given IVF, cardiology consulted. Syncope thought to be due  to recent furosemide  use. Use knee-high socks. Zio monitor to be mailed to her home. Was unable to tolerate previous jardiance  / losartan  because of hypotension. Found to have vitamin D/ B12 deficiency.   She presents today for a HF follow-up visit with a chief complaint of fatigue. Has associated dizziness, chest discomfort, palpitations. Denies shortness of breath, abdominal distention, pedal edema or difficulty sleeping.   ROS: All systems negative except what is listed in HPI, PMH and Problem List   Past Medical History:  Diagnosis Date   Anemia    AVM (arteriovenous malformation)    Diabetes mellitus without complication (HCC)    History of kidney stones    Peripheral neuropathy    Pneumonia    Tachycardia     Current Outpatient Medications  Medication Sig Dispense Refill   Blood Glucose Monitoring Suppl DEVI 1 each by Does not apply route 3 (three) times daily. May dispense any manufacturer covered by patient's insurance. 1 each 0   cyanocobalamin  1000 MCG tablet Take 1 tablet (1,000 mcg total) by mouth daily. 90 tablet 1   ferrous sulfate  325 (65 FE) MG EC tablet Take 1 tablet (325 mg total) by mouth 2 (two) times daily. 60 tablet 3   furosemide  (LASIX ) 40 MG tablet Take 1 tablet (40 mg total) by mouth as needed. 45 tablet 3   gabapentin  (NEURONTIN ) 300 MG capsule Take 600 mg by mouth  at bedtime.     Glucose Blood (BLOOD GLUCOSE TEST STRIPS) STRP 1 each by Does not apply route 3 (three) times daily. Use as directed to check blood sugar. May dispense any manufacturer covered by patient's insurance and fits patient's device. 100 strip 0   insulin  aspart (NOVOLOG ) 100 UNIT/ML FlexPen Inject 4 Units into the skin 3 (three) times daily with meals. Only take  if eating a meal AND Blood Glucose (BG) is 80 or higher. (Patient taking differently: Inject 8 Units into the skin 3 (three) times daily with meals. Only take if eating a meal AND Blood Glucose (BG) is 80 or higher.) 3 mL 2   insulin  glargine (LANTUS ) 100 UNIT/ML Solostar Pen Inject 14 Units into the skin at bedtime. May substitute as needed per insurance. (Patient taking differently: Inject 18-50 Units into the skin at bedtime. May substitute as needed per insurance.) 4.2 mL 2   Insulin  Pen Needle (PEN NEEDLES) 31G X 5 MM MISC 1 each by Does not apply route 3 (three) times daily. May dispense any manufacturer covered by patient's insurance. 100 each 0   Lancet Device MISC 1 each by Does not apply route 3 (three) times daily. May dispense any manufacturer covered by patient's insurance. 1 each 0   Lancets MISC 1 each by Does not apply route 3 (three) times daily. Use as directed to check blood sugar. May dispense any manufacturer covered by patient's insurance and fits patient's device. 100 each 0   metFORMIN  (GLUCOPHAGE ) 1000 MG tablet Take 1,000 mg by mouth 2 (two) times daily with a meal. Breakfast & supper     metoprolol  succinate (TOPROL -XL) 25 MG 24 hr tablet Take 1 tablet (25 mg total) by mouth daily. 30 tablet 5   midodrine  (PROAMATINE ) 2.5 MG tablet Take 1 tablet (2.5 mg total) by mouth 2 (two) times daily with a meal. 60 tablet 5   nortriptyline  (PAMELOR ) 50 MG capsule Take 100 mg by mouth at bedtime.     spironolactone  (ALDACTONE ) 25 MG tablet Take 0.5 tablets (12.5 mg total) by mouth daily. 15 tablet 5   Vitamin D, Ergocalciferol, (DRISDOL) 1.25 MG (50000 UNIT) CAPS capsule Take 1 capsule (50,000 Units total) by mouth every 7 (seven) days. 12 capsule 0   No current facility-administered medications for this visit.    Allergies  Allergen Reactions   Oxycodone Anaphylaxis and Swelling    Throat swelling   Oxycontin [Oxycodone Hcl] Anaphylaxis and Swelling    Throat swelling        Social History   Socioeconomic History   Marital status: Single    Spouse name: Not on file   Number of children: 2   Years of education: Not on file   Highest education level: Some college, no degree  Occupational History   Not on file  Tobacco Use   Smoking status: Never   Smokeless tobacco: Never  Vaping Use   Vaping status: Never Used  Substance and Sexual Activity   Alcohol use: No   Drug use: Not Currently   Sexual activity: Not on file  Other Topics Concern   Not on file  Social History Narrative   Lives alone   Social Drivers of Health   Financial Resource Strain: High Risk (10/09/2023)   Overall Financial Resource Strain (CARDIA)    Difficulty of Paying Living Expenses: Hard  Food Insecurity: No Food Insecurity (12/07/2023)   Hunger Vital Sign    Worried About Running Out of Food in the Last  Year: Never true    Ran Out of Food in the Last Year: Never true  Transportation Needs: No Transportation Needs (12/07/2023)   PRAPARE - Administrator, Civil Service (Medical): No    Lack of Transportation (Non-Medical): No  Physical Activity: Not on file  Stress: Not on file  Social Connections: Not on file  Intimate Partner Violence: Not At Risk (12/07/2023)   Humiliation, Afraid, Rape, and Kick questionnaire    Fear of Current or Ex-Partner: No    Emotionally Abused: No    Physically Abused: No    Sexually Abused: No      Family History  Problem Relation Age of Onset   Heart disease Paternal Aunt    Heart attack Maternal Grandfather    Heart disease Maternal Grandfather    Heart attack Cousin    Heart disease Cousin    CAD Neg Hx    Seizures Neg Hx    Vitals:   12/11/23 1508  BP: 105/83  Pulse: (!) 112  SpO2: 100%  Weight: 172 lb 3.2 oz (78.1 kg)   Wt Readings from Last 3 Encounters:  12/11/23 172 lb 3.2 oz (78.1 kg)  12/08/23 171 lb 11.8 oz (77.9 kg)  12/03/23 174 lb (78.9 kg)   Lab Results  Component Value Date   CREATININE 0.84  12/08/2023   CREATININE 0.91 12/06/2023   CREATININE 1.09 (H) 11/07/2023   PHYSICAL EXAM:  General: Well appearing. No resp difficulty HEENT: normal Neck: supple, no JVD Cor: Regular rhythm, tachycardic. No rubs, gallops or murmurs Lungs: clear Abdomen: soft, nontender, nondistended. Extremities: no cyanosis, clubbing, rash, edema Neuro: alert & oriented X 3. Moves all 4 extremities w/o difficulty. Affect pleasant   ECG: not done   ASSESSMENT & PLAN:  1: NICM with reduced ejection fraction- - unknown etiology at this time; cath without CAD, cMRI without amyloid; consider genetic cause; will do genetic buccal test today - NYHA class II - euvolemic - weight up 9 pounds from last visit here 1 month ago - Echo 10/07/23: LVEF 30-35%, mild LVH, GLS of -6.6%, RV function mildly reduced with moderately elevate PA systolic pressure, moderate MR, mild AR, moderate TR. - cMRI 10/10/23:  Normal LV size, moderately reduced LV systolic function. LVEF 35%. No LGE or scar. No evidence for infiltrative or inflammatory disease. Mildly reduced RV systolic function. No significant valvular abnormalities. Findings suggest non ischemic cardiomyopathy - has upcoming echo 07/25 with plans for ICD if EF remains low - genetic testing reviewed and was negative - unable to tolerate jardiance / losartan  due to hypotension - continue metoprolol  succinate 25mg  daily due to tachycardia - continue spironolactone  12.5mg  daily  - Itamar sleep study given to her today to rule out sleep apnea - BNP 12/06/23 was 39.6  2: Syncope- - saw EP Marven Slimmer) 06/25. ? Candidate for loop recorder - zio monitor was mailed to her and she brought it asking for staff to put it on; staff assisted with application - BP in office 105/83 - continue midodrine  2.5mg  BID if SBP is <110 - BMET 12/08/23 reviewed: sodium 137, potassium 3.5, creatinine 0.84 and GFR >60  3: T2DM- - A1c 10/09/23 was 10.4% - continue metformin  & insulin   4:  Anemia- - Hg 12/08/23 was 11.1   Return in 2 months, sooner if needed.   Charlette Console, FNP 12/11/23

## 2023-12-11 NOTE — Progress Notes (Signed)
 ITAMAR home sleep study given to patient, all instructions explained, waiver signed, and CLOUDPAT registration complete.

## 2023-12-11 NOTE — Progress Notes (Signed)
 Patient Name:         DOB:       Height:     Weight:  Office Name:         Referring Provider:  Today's Date:  Date:   STOP BANG RISK ASSESSMENT S (snore) Have you been told that you snore?     YES   T (tired) Are you often tired, fatigued, or sleepy during the day?   YES  O (obstruction) Do you stop breathing, choke, or gasp during sleep? NO   P (pressure) Do you have or are you being treated for high blood pressure? YES   B (BMI) Is your body index greater than 35 kg/m? NO   A (age) Are you 76 years old or older? NO   N (neck) Do you have a neck circumference greater than 16 inches?   NO   G (gender) Are you a female? NO   TOTAL STOP/BANG "YES" ANSWERS 3                                                                       For Office Use Only              Procedure Order Form    YES to 3+ Stop Bang questions OR two clinical symptoms - patient qualifies for WatchPAT (CPT 95800)             Clinical Notes: Will consult Sleep Specialist and refer for management of therapy due to patient increased risk of Sleep Apnea. Ordering a sleep study due to the following two clinical symptoms: Excessive daytime sleepiness G47.10  / History of high blood pressure R03.0     I understand that I am proceeding with a home sleep apnea test as ordered by my treating physician. I understand that untreated sleep apnea is a serious cardiovascular risk factor and it is my responsibility to perform the test and seek management for sleep apnea. I will be contacted with the results and be managed for sleep apnea by a local sleep physician. I will be receiving equipment and further instructions from [facility]. I shall promptly ship back the equipment via the included mailing label. I understand my insurance will be billed for the test and as the patient I am responsible for any insurance related out-of-pocket costs incurred. I have been provided with written instructions and can call for additional  video or telephonic instruction, with 24-hour availability of qualified personnel to answer any questions: Patient Help Desk [phone].  Patient Signature ______________________________________________________   Date______________________ Patient Telemedicine Verbal Consent

## 2023-12-24 ENCOUNTER — Encounter: Admitting: Cardiology

## 2023-12-31 ENCOUNTER — Other Ambulatory Visit: Payer: Self-pay | Admitting: Cardiology

## 2024-01-14 ENCOUNTER — Ambulatory Visit: Attending: Cardiology

## 2024-01-14 DIAGNOSIS — R55 Syncope and collapse: Secondary | ICD-10-CM | POA: Diagnosis not present

## 2024-01-14 DIAGNOSIS — I428 Other cardiomyopathies: Secondary | ICD-10-CM

## 2024-01-14 DIAGNOSIS — I5020 Unspecified systolic (congestive) heart failure: Secondary | ICD-10-CM

## 2024-01-14 LAB — ECHOCARDIOGRAM LIMITED
AV Mean grad: 4 mmHg
AV Peak grad: 7.7 mmHg
Ao pk vel: 1.39 m/s
S' Lateral: 3.11 cm

## 2024-01-17 ENCOUNTER — Ambulatory Visit: Payer: Self-pay | Admitting: Cardiology

## 2024-02-09 ENCOUNTER — Telehealth: Payer: Self-pay | Admitting: Cardiology

## 2024-02-09 NOTE — Telephone Encounter (Signed)
 Called to confirm/remind patient of their appointment at the [facility] on 02/10/24.   Appointment:   [x] Confirmed  [] Left mess   [] No answer/No voice mail  [] VM Full/unable to leave message  [] Phone not in service  Patient reminded to bring all medications and/or complete list.  Confirmed patient has transportation. Gave directions, instructed to utilize valet parking.

## 2024-02-10 ENCOUNTER — Encounter: Payer: Self-pay | Admitting: Cardiology

## 2024-02-10 ENCOUNTER — Encounter: Admitting: Family

## 2024-02-10 ENCOUNTER — Ambulatory Visit: Admitting: Cardiology

## 2024-02-10 ENCOUNTER — Other Ambulatory Visit: Payer: Self-pay

## 2024-02-10 ENCOUNTER — Other Ambulatory Visit (HOSPITAL_COMMUNITY): Payer: Self-pay | Admitting: Cardiology

## 2024-02-10 ENCOUNTER — Other Ambulatory Visit
Admission: RE | Admit: 2024-02-10 | Discharge: 2024-02-10 | Disposition: A | Discharge status: Home / Self Care | Source: Ambulatory Visit | Attending: Cardiology | Admitting: Cardiology

## 2024-02-10 ENCOUNTER — Inpatient Hospital Stay (HOSPITAL_COMMUNITY)
Admission: RE | Admit: 2024-02-10 | Discharge: 2024-02-10 | Disposition: A | Discharge status: Home / Self Care | Source: Ambulatory Visit | Attending: Cardiology | Admitting: Cardiology

## 2024-02-10 VITALS — BP 160/102 | HR 104 | Wt 187.0 lb

## 2024-02-10 DIAGNOSIS — I5020 Unspecified systolic (congestive) heart failure: Secondary | ICD-10-CM | POA: Diagnosis present

## 2024-02-10 DIAGNOSIS — R55 Syncope and collapse: Secondary | ICD-10-CM

## 2024-02-10 LAB — BASIC METABOLIC PANEL WITH GFR
Anion gap: 9 (ref 5–15)
BUN: 20 mg/dL (ref 6–20)
CO2: 28 mmol/L (ref 22–32)
Calcium: 9.1 mg/dL (ref 8.9–10.3)
Chloride: 96 mmol/L — ABNORMAL LOW (ref 98–111)
Creatinine, Ser: 0.96 mg/dL (ref 0.44–1.00)
GFR, Estimated: >60 mL/min (ref >60)
Glucose, Bld: 314 mg/dL — ABNORMAL HIGH (ref 70–99)
Potassium: 4.2 mmol/L (ref 3.5–5.1)
Sodium: 133 mmol/L — ABNORMAL LOW (ref 135–145)

## 2024-02-10 LAB — BRAIN NATRIURETIC PEPTIDE: B Natriuretic Peptide: 36.2 pg/mL (ref 0.0–100.0)

## 2024-02-10 MED ORDER — JARDIANCE 10 MG PO TABS
10.0000 mg | ORAL_TABLET | Freq: Every day | ORAL | 11 refills | Status: DC
  Filled 2024-02-10 – 2024-03-16 (×2): qty 30, 30d supply, fill #0

## 2024-02-10 NOTE — Progress Notes (Signed)
 Zio patch placed onto patient.  All instructions and information reviewed with patient, they verbalize understanding with no questions.   cardiomyopathy genetic testing collected via buccal per Dr rolan.  Order form completed, signed and shipped with sample by FedEx to [facility].

## 2024-02-10 NOTE — Patient Instructions (Signed)
 Medication Changes:  STOP Midodrine  START Jardiance 10 mg (1 tab) daily  Lab Work:  Go over to the [facility]. Go pass the gift shop and have your blood work completed TODAY AND IN 2 WEEKS.   We will only call you if the results are abnormal or if the provider would like to make medication changes.  No news is good news.   Testing/Procedures:  Genetic testing has been collected, this has to be sent to [place]  for processing and can take 1-2 weeks for us  to get results back.  We will let you know the results once reviewed by your provider.   Your provider has recommended that  you wear a Zio Patch for 14 days.  This monitor will record your heart rhythm for our review.  IF you have any symptoms while wearing the monitor please press the button.  If you have any issues with the patch or you notice a red or orange light on it please call the company at [phone].  Once you remove the patch please mail it back to the company as soon as possible so we can get the results.   Follow-Up in: Please follow up with the Advanced [facility] Failure Clinic in 3 weeks with Ellouise Class, FNP.   Thank you for choosing [facility].    At the Advanced [facility] Failure Clinic, you and your health needs are our priority. We have a designated team specialized in the treatment of Heart Failure. This Care Team includes your primary Heart Failure Specialized Cardiologist (physician), Advanced Practice Providers (APPs- Physician Assistants and Nurse Practitioners), and Pharmacist who all work together to provide you with the care you need, when you need it.   You may see any of the following providers on your designated Care Team at your next follow up:  Dr. Toribio Fuel Dr. Ezra Shuck Dr. Ria Commander Dr. Morene Brownie Ellouise Class, FNP Jaun Bash, RPH-CPP  Please be sure to bring in all your medications bottles to every appointment.   Need to  Contact Us :  If you have any questions or concerns before your next appointment please send us  a message through [place] or call our office at [phone].    TO LEAVE A MESSAGE FOR THE NURSE SELECT OPTION 2, PLEASE LEAVE A MESSAGE INCLUDING: YOUR NAME DATE OF BIRTH CALL BACK NUMBER REASON FOR CALL**this is important as we prioritize the call backs  YOU WILL RECEIVE A CALL BACK THE SAME DAY AS LONG AS YOU CALL BEFORE 4:00 PM

## 2024-02-11 ENCOUNTER — Ambulatory Visit (HOSPITAL_COMMUNITY): Payer: Self-pay | Admitting: Cardiology

## 2024-02-11 NOTE — Progress Notes (Signed)
 [facility] Note    PCP: Cyrus Selinda Moose, PA-C  Chief Complaint: CHF   HPI:  Ms Katzman is a 43 y.o. female with a history of type 2 diabetes, hysterectomy complicated by post-op anemia requiring blood transfusion, inappropriate sinus tachycardia, brain AVM, obesity, anemia, peripheral neuropathy and chronic systolic heart failure. Has paternal first cousin who died suddenly in her 30s from cardiac arrest.    Echo 10/2022 showed EF 60-65% without RWMA, mild LVH, G1DD, normal RV systolic function and sive, small pericardial effusion present, with RA pressure 3 mmHg.   Admitted 10/06/23 with dizziness and syncope. 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 When she came to, she was laying on the ground and the resident was yelling her name. BNP was 235, HS-troponin was 35. CTA chest was negative for PE, but showed cardiomegaly, pulmonary edema, small bilateral pleural effusions, and dilated pulmonary artery. 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 Echocardiogram 10/07/23 showed LVEF 30-35%, mild LVH, GLS of -6.6%, RV function mildly reduced with moderately elevate PA systolic pressure, moderate MR, mild AR, moderate TR. EP consulted. LHC/RHC showed no significant CAD; CI 2.89, elevated filling pressures. Cardiac MRI showed LV EF 35%, mild RV dysfunction, no LGE.   Admitted 12/06/23 due to syncopal episode at home. She had another syncopal episode while in the ED. EKG showed ST with borderline prolonged QT interval. Given IVF, cardiology consulted. Syncope thought to be due  to over-diuresis. Was unable to tolerate previous jardiance  / losartan  because of hypotension. Found to have vitamin D / B12 deficiency. Zio monitor was supposed to be mailed to her home but she says that she never received.   Echo in 7/25 showed EF up to 50-55% with mild LVH, normal RV, normal IVC.   She presents today for a HF follow-up.   BP is significantly elevated today.  She is on midodrine .  She occasionally uses Lasix .  She is short of breath walking up stairs but not short of breath walking on flat ground.  She works at an assisted living.  Fatigues easily. Weight has trended up at home  ECG (personally reviewed): Sinus tachy at 105, LVH  Labs (6/25): K 3.5, creatinine 0.84   ROS: All systems negative except what is listed in HPI, PMH and Problem List   Past Medical History:  Diagnosis Date   Anemia    AVM (arteriovenous malformation)    Diabetes mellitus without complication (HCC)    History of kidney stones    Peripheral neuropathy    Pneumonia    Tachycardia     Current Outpatient Medications  Medication Sig Dispense Refill   Blood Glucose Monitoring Suppl DEVI 1 each by Does not apply route 3 (three) times daily. May dispense any manufacturer covered by patient's insurance. 1 each 0   cyanocobalamin  1000 MCG tablet Take 1 tablet (1,000 mcg total) by mouth daily. 90 tablet 1   ferrous sulfate  325 (65 FE) MG EC tablet Take 1 tablet (325 mg total) by mouth 2 (two) times daily. 60 tablet 3   furosemide  (LASIX ) 40 MG tablet Take 1 tablet (40 mg total) by mouth as needed. 45 tablet 3   gabapentin  (NEURONTIN ) 300 MG capsule Take 600 mg by mouth at bedtime.     Glucose Blood (BLOOD GLUCOSE TEST STRIPS) STRP 1 each by Does not apply route 3 (three) times daily. Use as directed to check blood sugar. May dispense any  manufacturer covered by [employer] and fits patient's device. 100 strip 0   insulin  aspart (NOVOLOG ) 100 UNIT/ML FlexPen Inject 4 Units into the skin 3 (three) times daily with meals. Only take if eating a meal AND Blood Glucose (BG) is 80 or higher. 3 mL 2   insulin  glargine (LANTUS ) 100 UNIT/ML Solostar Pen Inject 14 Units into the skin at bedtime. May substitute as needed per insurance. 4.2 mL 2   Insulin  Pen Needle (PEN NEEDLES) 31G X 5 MM MISC 1 each by Does not apply route 3 (three) times  daily. May dispense any manufacturer covered by patient's insurance. 100 each 0   JARDIANCE  10 MG TABS tablet Take 1 tablet (10 mg total) by mouth daily before breakfast. 30 tablet 11   Lancet Device MISC 1 each by Does not apply route 3 (three) times daily. May dispense any manufacturer covered by patient's insurance. 1 each 0   Lancets MISC 1 each by Does not apply route 3 (three) times daily. Use as directed to check blood sugar. May dispense any manufacturer covered by patient's insurance and fits patient's device. 100 each 0   metFORMIN  (GLUCOPHAGE ) 1000 MG tablet Take 1,000 mg by mouth 2 (two) times daily with a meal. Breakfast & supper     metoprolol  succinate (TOPROL -XL) 25 MG 24 hr tablet Take 1 tablet (25 mg total) by mouth daily. 30 tablet 5   nortriptyline  (PAMELOR ) 50 MG capsule Take 100 mg by mouth at bedtime.     spironolactone  (ALDACTONE ) 25 MG tablet Take 0.5 tablets (12.5 mg total) by mouth daily. 15 tablet 5   Vitamin D , Ergocalciferol , (DRISDOL ) 1.25 MG (50000 UNIT) CAPS capsule Take 1 capsule (50,000 Units total) by mouth every 7 (seven) days. 12 capsule 0   No current facility-administered medications for this visit.    Allergies  Allergen Reactions   Oxycodone Anaphylaxis and Swelling    Throat swelling   Oxycontin [Oxycodone Hcl] Anaphylaxis and Swelling    Throat swelling       Social History   Socioeconomic History   Marital status: Single    Spouse name: Not on file   Number of children: 2   Years of education: Not on file   Highest education level: Some college, no degree  Occupational History   Not on file  Tobacco Use   Smoking status: Never   Smokeless tobacco: Never  Vaping Use   Vaping status: Never Used  Substance and Sexual Activity   Alcohol use: No   Drug use: Not Currently   Sexual activity: Not on file  Other Topics Concern   Not on file  Social History Narrative   Lives alone   Social Drivers of Health   Financial Resource  Strain: High Risk (10/09/2023)   Overall Financial Resource Strain (CARDIA)    Difficulty of Paying Living Expenses: Hard  Food Insecurity: No Food Insecurity (12/07/2023)   Hunger Vital Sign    Worried About Running Out of Food in the Last Year: Never true    Ran Out of Food in the Last Year: Never true  Transportation Needs: No Transportation Needs (12/07/2023)   PRAPARE - Administrator, Civil Service (Medical): No    Lack of Transportation (Non-Medical): No  Physical Activity: Not on file  Stress: Not on file  Social Connections: Not on file  Intimate Partner Violence: Not At Risk (12/07/2023)   Humiliation, Afraid, Rape, and Kick questionnaire    Fear of Current or  Ex-Partner: No    Emotionally Abused: No    Physically Abused: No    Sexually Abused: No      Family History  Problem Relation Age of Onset   Heart disease Paternal Aunt    Heart attack Maternal Grandfather    Heart disease Maternal Grandfather    Heart attack Cousin    Heart disease Cousin    CAD Neg Hx    Seizures Neg Hx    Vitals:   02/10/24 1610  BP: (!) 160/102  Pulse: (!) 104  SpO2: 99%  Weight: 187 lb (84.8 kg)   Wt Readings from Last 3 Encounters:  02/10/24 187 lb (84.8 kg)  12/11/23 172 lb 3.2 oz (78.1 kg)  12/08/23 171 lb 11.8 oz (77.9 kg)   Lab Results  Component Value Date   CREATININE 0.96 02/10/2024   CREATININE 0.84 12/08/2023   CREATININE 0.91 12/06/2023   PHYSICAL EXAM: General: NAD Neck: No JVD, no thyromegaly or thyroid  nodule.  Lungs: Clear to auscultation bilaterally with normal respiratory effort. CV: Nondisplaced PMI.  Heart regular S1/S2, no S3/S4, no murmur.  No peripheral edema.  No carotid bruit.  Normal pedal pulses.  Abdomen: Soft, nontender, no hepatosplenomegaly, no distention.  Skin: Intact without lesions or rashes.  Neurologic: Alert and oriented x 3.  Psych: Normal affect. Extremities: No clubbing or cyanosis.  HEENT: Normal.    ASSESSMENT &  PLAN:  1: HF with recovered EF: Nonischemic cardiomyopathy.  Echo in 4/25 with LVEF 30-35%, mild LVH, GLS of -6.6%, RV function mildly reduced with moderately elevate PA systolic pressure, moderate MR, mild AR, moderate TR.  LHC in 4/25 showed no significant CAD. Cardiac MRI in 4/25 showed LV EF 35%, mild RV dysfunction, no LGE.  Echo done in 7/25 showed significant recovery of LV function with EF up to 50-55% with mild LVH, normal RV.  She is not volume overloaded on exam, NYHA class II symptoms. GDMT has been limited by low BP.  - Prevention genetics testing was accidentally sent just for TTR mutations.  Need to resend for hereditary cardiomyopathies.  She had a 1st cousin with sudden death at a young age.  - BP elevated and EF improved, stop midodrine .  - Start Jardiance  10 mg daily.  BMET/BNP today, BMET in 10 days.  - Continue Toprol  XL 25 mg daily.  - Continue spironolactone  12.5 mg daily.  - Off losartan  due to low BP.  2: Syncope: 4/25 and 6/25.  No arrhythmic events noted on telemetry in hospital.  Chronic mild sinus tachycardia.  EF now recovered. She has been orthostatic in the past.  - I will arrange for 2 week Zio monitor.  3. Type 2 diabetes: Per PCP.   Followup in 3 wks with NP  I spent 32 minutes reviewing records, interviewing/examining patient, and managing orders.   Ezra Shuck, MD 02/11/24

## 2024-02-20 ENCOUNTER — Other Ambulatory Visit: Payer: Self-pay

## 2024-02-26 ENCOUNTER — Ambulatory Visit (HOSPITAL_COMMUNITY): Payer: Self-pay | Admitting: Cardiology

## 2024-02-26 ENCOUNTER — Other Ambulatory Visit
Admission: RE | Admit: 2024-02-26 | Discharge: 2024-02-26 | Disposition: A | Discharge status: Home / Self Care | Source: Ambulatory Visit | Attending: Cardiology | Admitting: Cardiology

## 2024-02-26 DIAGNOSIS — I5020 Unspecified systolic (congestive) heart failure: Secondary | ICD-10-CM | POA: Diagnosis present

## 2024-02-26 LAB — BASIC METABOLIC PANEL WITH GFR
Anion gap: 13 (ref 5–15)
BUN: 27 mg/dL — ABNORMAL HIGH (ref 6–20)
CO2: 27 mmol/L (ref 22–32)
Calcium: 8.9 mg/dL (ref 8.9–10.3)
Chloride: 89 mmol/L — ABNORMAL LOW (ref 98–111)
Creatinine, Ser: 1.58 mg/dL — ABNORMAL HIGH (ref 0.44–1.00)
GFR, Estimated: 41 mL/min — ABNORMAL LOW (ref >60)
Glucose, Bld: 413 mg/dL — ABNORMAL HIGH (ref 70–99)
Potassium: 4 mmol/L (ref 3.5–5.1)
Sodium: 129 mmol/L — ABNORMAL LOW (ref 135–145)

## 2024-02-26 LAB — BRAIN NATRIURETIC PEPTIDE: B Natriuretic Peptide: 27.4 pg/mL (ref 0.0–100.0)

## 2024-03-02 ENCOUNTER — Telehealth: Payer: Self-pay | Admitting: Cardiology

## 2024-03-02 NOTE — Telephone Encounter (Signed)
 Pt states that she is not taking spironolactone . She states that she has taken furosemide  maybe 1 or 2 times in the last two week. Pt states the last time she had issues with her kidneys, it was the jardiance  that was the cause.

## 2024-03-02 NOTE — Telephone Encounter (Signed)
 Called to confirm/remind patient of their appointment at the [facility] on 03/03/24.   Appointment:   [x] Confirmed  [] Left mess   [] No answer/No voice mail  [] VM Full/unable to leave message  [] Phone not in service  Patient reminded to bring all medications and/or complete list.  Confirmed patient has transportation. Gave directions, instructed to utilize valet parking.

## 2024-03-03 ENCOUNTER — Ambulatory Visit: Attending: Family | Admitting: Family

## 2024-03-03 ENCOUNTER — Other Ambulatory Visit: Payer: Self-pay

## 2024-03-03 ENCOUNTER — Encounter: Payer: Self-pay | Admitting: Family

## 2024-03-03 VITALS — BP 142/100 | HR 113 | Wt 182.1 lb

## 2024-03-03 DIAGNOSIS — E669 Obesity, unspecified: Secondary | ICD-10-CM | POA: Insufficient documentation

## 2024-03-03 DIAGNOSIS — I5032 Chronic diastolic (congestive) heart failure: Secondary | ICD-10-CM | POA: Diagnosis present

## 2024-03-03 DIAGNOSIS — E1142 Type 2 diabetes mellitus with diabetic polyneuropathy: Secondary | ICD-10-CM | POA: Diagnosis not present

## 2024-03-03 DIAGNOSIS — R0602 Shortness of breath: Secondary | ICD-10-CM | POA: Insufficient documentation

## 2024-03-03 DIAGNOSIS — Z5986 Financial insecurity: Secondary | ICD-10-CM | POA: Insufficient documentation

## 2024-03-03 DIAGNOSIS — Z90710 Acquired absence of both cervix and uterus: Secondary | ICD-10-CM | POA: Diagnosis not present

## 2024-03-03 DIAGNOSIS — Z7984 Long term (current) use of oral hypoglycemic drugs: Secondary | ICD-10-CM | POA: Insufficient documentation

## 2024-03-03 DIAGNOSIS — R55 Syncope and collapse: Secondary | ICD-10-CM | POA: Diagnosis not present

## 2024-03-03 DIAGNOSIS — Z794 Long term (current) use of insulin: Secondary | ICD-10-CM | POA: Insufficient documentation

## 2024-03-03 DIAGNOSIS — R600 Localized edema: Secondary | ICD-10-CM | POA: Insufficient documentation

## 2024-03-03 DIAGNOSIS — Z79899 Other long term (current) drug therapy: Secondary | ICD-10-CM | POA: Insufficient documentation

## 2024-03-03 DIAGNOSIS — E119 Type 2 diabetes mellitus without complications: Secondary | ICD-10-CM

## 2024-03-03 DIAGNOSIS — R000 Tachycardia, unspecified: Secondary | ICD-10-CM | POA: Insufficient documentation

## 2024-03-03 DIAGNOSIS — I5020 Unspecified systolic (congestive) heart failure: Secondary | ICD-10-CM

## 2024-03-03 DIAGNOSIS — I428 Other cardiomyopathies: Secondary | ICD-10-CM | POA: Diagnosis not present

## 2024-03-03 LAB — BASIC METABOLIC PANEL WITH GFR
Anion gap: 11 (ref 5–15)
BUN: 15 mg/dL (ref 6–20)
CO2: 27 mmol/L (ref 22–32)
Calcium: 9.4 mg/dL (ref 8.9–10.3)
Chloride: 97 mmol/L — ABNORMAL LOW (ref 98–111)
Creatinine, Ser: 0.92 mg/dL (ref 0.44–1.00)
GFR, Estimated: >60 mL/min (ref >60)
Glucose, Bld: 395 mg/dL — ABNORMAL HIGH (ref 70–99)
Potassium: 4.5 mmol/L (ref 3.5–5.1)
Sodium: 135 mmol/L (ref 135–145)

## 2024-03-03 MED ORDER — METOPROLOL SUCCINATE ER 25 MG PO TB24
50.0000 mg | ORAL_TABLET | Freq: Every day | ORAL | 11 refills | Status: DC
  Filled 2024-03-03 – 2024-03-16 (×2): qty 60, 30d supply, fill #0

## 2024-03-03 NOTE — Progress Notes (Unsigned)
 [facility] Note    PCP: Cyrus Selinda Moose, PA-C HF cardiology: Rolan Barrack, MD  Chief Complaint: shortness of breath   HPI:  Mary Velasquez is a 43 y.o. female with a history of type 2 diabetes, hysterectomy complicated by post-op anemia requiring blood transfusion, inappropriate sinus tachycardia, brain AVM, obesity, anemia, peripheral neuropathy and chronic systolic heart failure. Has paternal first cousin who died suddenly in her 30s from cardiac arrest.    Echo 10/2022 showed EF 60-65% without RWMA, mild LVH, G1DD, normal RV systolic function and sive, small pericardial effusion present, with RA pressure 3 mmHg.   Admitted 10/06/23 with dizziness and syncope. 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 When she came to, she was laying on the ground and the resident was yelling her name. BNP was 235, HS-troponin was 35. CTA chest was negative for PE, but showed cardiomegaly, pulmonary edema, small bilateral pleural effusions, and dilated pulmonary artery. 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 Echocardiogram 10/07/23 showed LVEF 30-35%, mild LVH, GLS of -6.6%, RV function mildly reduced with moderately elevate PA systolic pressure, moderate MR, mild AR, moderate TR. EP consulted. LHC/RHC showed no significant CAD; CI 2.89, elevated filling pressures. Cardiac MRI showed LV EF 35%, mild RV dysfunction, no LGE.   Admitted 12/06/23 due to syncopal episode at home. She had another syncopal episode while in the ED. EKG showed ST with borderline prolonged QT interval. Given IVF, cardiology consulted. Syncope thought to be due  to over-diuresis. Was unable to tolerate previous jardiance  / losartan  because of hypotension. Found to have vitamin D / B12 deficiency. Zio monitor was supposed to be mailed to her home but she says that she never received.   Echo in 7/25 showed EF up to 50-55% with mild LVH, normal RV,  normal IVC.   She presents today for a HF follow-up visit with a chief complaint of shortness of breath. Has associated intermittent wheezing, slight cough, right sided chest pain on occasion, palpitations, chronic dizziness, pedal edema , abdominal swelling. AM BP 94/60 at home, 130/84 in the afternoon. Has not taken furosemide  in ~ 2 weeks. Mailed in zio monitor 02/26/24.   ECG 02/10/24: Sinus tachy at 105, LVH  Labs (6/25): K 3.5, creatinine 0.84  Labs (8/25): K 4.0, creatinine 1.58, GFR 41, BNP 27.4, A1c 10.4%  ROS: All systems negative except what is listed in HPI, PMH and Problem List   Past Medical History:  Diagnosis Date   Anemia    AVM (arteriovenous malformation)    Diabetes mellitus without complication (HCC)    History of kidney stones    Peripheral neuropathy    Pneumonia    Tachycardia     Current Outpatient Medications  Medication Sig Dispense Refill   Blood Glucose Monitoring Suppl DEVI 1 each by Does not apply route 3 (three) times daily. May dispense any manufacturer covered by patient's insurance. 1 each 0   cyanocobalamin  1000 MCG tablet Take 1 tablet (1,000 mcg total) by mouth daily. 90 tablet 1   ferrous sulfate  325 (65 FE) MG EC tablet Take 1 tablet (325 mg total) by mouth 2 (two) times daily. 60 tablet 3   furosemide  (LASIX ) 40 MG tablet Take 1 tablet (40 mg total) by mouth as needed. 45 tablet 3   gabapentin  (NEURONTIN ) 300 MG capsule Take 600 mg by mouth at bedtime.     Glucose Blood (BLOOD GLUCOSE TEST STRIPS) STRP 1  each by Does not apply route 3 (three) times daily. Use as directed to check blood sugar. May dispense any manufacturer covered by patient's insurance and fits patient's device. 100 strip 0   insulin  aspart (NOVOLOG ) 100 UNIT/ML FlexPen Inject 4 Units into the skin 3 (three) times daily with meals. Only take if eating a meal AND Blood Glucose (BG) is 80 or higher. 3 mL 2   insulin  glargine (LANTUS ) 100 UNIT/ML Solostar Pen Inject 14 Units into  the skin at bedtime. May substitute as needed per insurance. 4.2 mL 2   Insulin  Pen Needle (PEN NEEDLES) 31G X 5 MM MISC 1 each by Does not apply route 3 (three) times daily. May dispense any manufacturer covered by patient's insurance. 100 each 0   JARDIANCE  10 MG TABS tablet Take 1 tablet (10 mg total) by mouth daily before breakfast. 30 tablet 11   Lancet Device MISC 1 each by Does not apply route 3 (three) times daily. May dispense any manufacturer covered by patient's insurance. 1 each 0   Lancets MISC 1 each by Does not apply route 3 (three) times daily. Use as directed to check blood sugar. May dispense any manufacturer covered by patient's insurance and fits patient's device. 100 each 0   metFORMIN  (GLUCOPHAGE ) 1000 MG tablet Take 1,000 mg by mouth 2 (two) times daily with a meal. Breakfast & supper     metoprolol  succinate (TOPROL -XL) 25 MG 24 hr tablet Take 1 tablet (25 mg total) by mouth daily. 30 tablet 5   nortriptyline  (PAMELOR ) 50 MG capsule Take 100 mg by mouth at bedtime.     spironolactone  (ALDACTONE ) 25 MG tablet Take 0.5 tablets (12.5 mg total) by mouth daily. 15 tablet 5   Vitamin D , Ergocalciferol , (DRISDOL ) 1.25 MG (50000 UNIT) CAPS capsule Take 1 capsule (50,000 Units total) by mouth every 7 (seven) days. 12 capsule 0   No current facility-administered medications for this visit.    Allergies  Allergen Reactions   Oxycodone Anaphylaxis and Swelling    Throat swelling   Oxycontin [Oxycodone Hcl] Anaphylaxis and Swelling    Throat swelling       Social History   Socioeconomic History   Marital status: Single    Spouse name: Not on file   Number of children: 2   Years of education: Not on file   Highest education level: Some college, no degree  Occupational History   Not on file  Tobacco Use   Smoking status: Never   Smokeless tobacco: Never  Vaping Use   Vaping status: Never Used  Substance and Sexual Activity   Alcohol use: No   Drug use: Not Currently    Sexual activity: Not on file  Other Topics Concern   Not on file  Social History Narrative   Lives alone   Social Drivers of Health   Financial Resource Strain: High Risk (10/09/2023)   Overall Financial Resource Strain (CARDIA)    Difficulty of Paying Living Expenses: Hard  Food Insecurity: No Food Insecurity (12/07/2023)   Hunger Vital Sign    Worried About Running Out of Food in the Last Year: Never true    Ran Out of Food in the Last Year: Never true  Transportation Needs: No Transportation Needs (12/07/2023)   PRAPARE - Administrator, Civil Service (Medical): No    Lack of Transportation (Non-Medical): No  Physical Activity: Not on file  Stress: Not on file  Social Connections: Not on file  Intimate Partner  Violence: Not At Risk (12/07/2023)   Humiliation, Afraid, Rape, and Kick questionnaire    Fear of Current or Ex-Partner: No    Emotionally Abused: No    Physically Abused: No    Sexually Abused: No      Family History  Problem Relation Age of Onset   Heart disease Paternal Aunt    Heart attack Maternal Grandfather    Heart disease Maternal Grandfather    Heart attack Cousin    Heart disease Cousin    CAD Neg Hx    Seizures Neg Hx     Vitals:   03/03/24 1540  BP: (!) 142/100  Pulse: (!) 113  SpO2: 97%  Weight: 182 lb 2 oz (82.6 kg)   Wt Readings from Last 3 Encounters:  03/03/24 182 lb 2 oz (82.6 kg)  02/10/24 187 lb (84.8 kg)  12/11/23 172 lb 3.2 oz (78.1 kg)    Lab Results  Component Value Date   CREATININE 0.92 03/03/2024   CREATININE 1.58 (H) 02/26/2024   CREATININE 0.96 02/10/2024     PHYSICAL EXAM:  General: Well appearing.  Cor: No JVD. Regular rhythm, tachycardic.  Lungs: clear Abdomen: soft, nontender, nondistended. Extremities: trace pitting edema bilateral lower legs Neuro:. Affect pleasant   ASSESSMENT & PLAN:  1: HF with recovered EF: Nonischemic cardiomyopathy.  Echo in 4/25 with LVEF 30-35%, mild LVH, GLS of  -6.6%, RV function mildly reduced with moderately elevate PA systolic pressure, moderate MR, mild AR, moderate TR.  LHC in 4/25 showed no significant CAD. Cardiac MRI in 4/25 showed LV EF 35%, mild RV dysfunction, no LGE.  Echo done in 7/25 showed significant recovery of LV function with EF up to 50-55% with mild LVH, normal RV.   - NYHA Velasquez II symptoms.  - euvolemic.  - Weight down 5 pounds from last visit here 3 weeks ago - Prevention genetics testing for hereditary cardiomyopathies was indeterminate. Reviewed what this meant and offered genetic counseling if she would like. She deferred today. She had a 1st cousin with sudden death at a young age.  - GDMT has been limited by low BP.  - has furosemide  PRN and hasn't taken in ~ 2 weeks - Continue Jardiance  10 mg daily.  BMET today - Increase Toprol  XL to 50 mg daily due to tachycardic - Off spironolactone  due to low BP - Off losartan  due to low BP. She is getting low BP's at home in the mornings and then it rises as the day progresses  - BMET 02/26/24 with worsening renal function. Recheck BMET today 2: Syncope: 4/25 and 6/25.  No arrhythmic events noted on telemetry in hospital.  Chronic mild sinus tachycardia.  EF now recovered. She has been orthostatic in the past.  - 2 week zio 02/10/24, returned 02/26/24. Pending results 3. Type 2 diabetes: Per PCP.  - A1c 10/09/23 was 10.4%    Return in 1 month, sooner if needed.    Mary DELENA Class, FNP 03/03/24

## 2024-03-03 NOTE — Patient Instructions (Signed)
 Medication Changes:  INCREASE METOPROLOL  TO 50 MG DAILY (2 - 25 MG TABLETS DAILY)   Lab Work:  Go over to the [facility]. Go pass the gift shop and have your blood work completed.  We will only call you if the results are abnormal or if the provider would like to make medication changes.  No news is good news.   Follow-Up in: 1 MONTHS WITH ELLOUISE CLASS, FNP.   Thank you for choosing [facility].    At the [facility], you and your health needs are our priority. We have a designated team specialized in the treatment of Heart Failure. This Care Team includes your primary Heart Failure Specialized Cardiologist (physician), Advanced Practice Providers (APPs- Physician Assistants and Nurse Practitioners), and Pharmacist who all work together to provide you with the care you need, when you need it.   You may see any of the following providers on your designated Care Team at your next follow up:  Dr. Toribio Fuel Dr. Ezra Shuck Dr. Ria Commander Dr. Morene Brownie ELLOUISE CLASS, FNP Jaun Bash, RPH-CPP  Please be sure to bring in all your medications bottles to every appointment.   Need to Contact Us :  If you have any questions or concerns before your next appointment please send us  a message through [place] or call our office at [phone].    TO LEAVE A MESSAGE FOR THE NURSE SELECT OPTION 2, PLEASE LEAVE A MESSAGE INCLUDING: YOUR NAME DATE OF BIRTH CALL BACK NUMBER REASON FOR CALL**this is important as we prioritize the call backs  YOU WILL RECEIVE A CALL BACK THE SAME DAY AS LONG AS YOU CALL BEFORE 4:00 PM

## 2024-03-04 ENCOUNTER — Ambulatory Visit: Payer: Self-pay | Admitting: Family

## 2024-03-04 ENCOUNTER — Encounter: Payer: Self-pay | Admitting: Family

## 2024-03-15 ENCOUNTER — Other Ambulatory Visit: Payer: Self-pay

## 2024-03-16 ENCOUNTER — Other Ambulatory Visit: Payer: Self-pay

## 2024-03-16 MED FILL — Spironolactone Tab 25 MG: ORAL | 30 days supply | Qty: 15 | Fill #1 | Status: CN

## 2024-03-19 ENCOUNTER — Telehealth: Payer: Self-pay | Admitting: Surgery

## 2024-03-19 NOTE — Telephone Encounter (Signed)
 I attempted to reach patient after receiving communication from Zio that the device was overdue for return.  I was unable to leave a message. I will try again at a later time to reach patient.

## 2024-03-29 ENCOUNTER — Other Ambulatory Visit: Payer: Self-pay

## 2024-04-07 ENCOUNTER — Encounter: Admitting: Family

## 2024-04-15 ENCOUNTER — Emergency Department
Admission: EM | Admit: 2024-04-15 | Discharge: 2024-04-15 | Disposition: A | Discharge status: Home / Self Care | Attending: Emergency Medicine | Admitting: Emergency Medicine

## 2024-04-15 ENCOUNTER — Other Ambulatory Visit: Payer: Self-pay

## 2024-04-15 DIAGNOSIS — R55 Syncope and collapse: Secondary | ICD-10-CM | POA: Insufficient documentation

## 2024-04-15 DIAGNOSIS — E119 Type 2 diabetes mellitus without complications: Secondary | ICD-10-CM | POA: Insufficient documentation

## 2024-04-15 DIAGNOSIS — E871 Hypo-osmolality and hyponatremia: Secondary | ICD-10-CM | POA: Diagnosis not present

## 2024-04-15 LAB — URINALYSIS, ROUTINE W REFLEX MICROSCOPIC
Bacteria, UA: NONE SEEN
Bilirubin Urine: NEGATIVE
Glucose, UA: >=500 mg/dL — AB
Hgb urine dipstick: NEGATIVE
Ketones, ur: NEGATIVE mg/dL
Leukocytes,Ua: NEGATIVE
Nitrite: NEGATIVE
Protein, ur: NEGATIVE mg/dL
Specific Gravity, Urine: 1.025 (ref 1.005–1.030)
pH: 6 (ref 5.0–8.0)

## 2024-04-15 LAB — CBG MONITORING, ED
Glucose-Capillary: 420 mg/dL — ABNORMAL HIGH (ref 70–99)
Glucose-Capillary: 336 mg/dL — ABNORMAL HIGH (ref 70–99)

## 2024-04-15 LAB — CBC
HCT: 34.5 % — ABNORMAL LOW (ref 36.0–46.0)
Hemoglobin: 10.9 g/dL — ABNORMAL LOW (ref 12.0–15.0)
MCH: 27.4 pg (ref 26.0–34.0)
MCHC: 31.6 g/dL (ref 30.0–36.0)
MCV: 86.7 fL (ref 80.0–100.0)
Platelets: 325 K/uL (ref 150–400)
RBC: 3.98 MIL/uL (ref 3.87–5.11)
RDW: 13.3 % (ref 11.5–15.5)
WBC: 6.9 K/uL (ref 4.0–10.5)
nRBC: 0 % (ref 0.0–0.2)

## 2024-04-15 LAB — PREGNANCY, URINE: Preg Test, Ur: NEGATIVE

## 2024-04-15 LAB — COMPREHENSIVE METABOLIC PANEL WITH GFR
ALT: 9 U/L (ref 0–44)
AST: 15 U/L (ref 15–41)
Albumin: 3.3 g/dL — ABNORMAL LOW (ref 3.5–5.0)
Alkaline Phosphatase: 120 U/L (ref 38–126)
Anion gap: 10 (ref 5–15)
BUN: 22 mg/dL — ABNORMAL HIGH (ref 6–20)
CO2: 27 mmol/L (ref 22–32)
Calcium: 8.9 mg/dL (ref 8.9–10.3)
Chloride: 96 mmol/L — ABNORMAL LOW (ref 98–111)
Creatinine, Ser: 1.05 mg/dL — ABNORMAL HIGH (ref 0.44–1.00)
GFR, Estimated: >60 mL/min (ref >60)
Glucose, Bld: 410 mg/dL — ABNORMAL HIGH (ref 70–99)
Potassium: 4.5 mmol/L (ref 3.5–5.1)
Sodium: 133 mmol/L — ABNORMAL LOW (ref 135–145)
Total Bilirubin: 0.3 mg/dL (ref 0.0–1.2)
Total Protein: 8.1 g/dL (ref 6.5–8.1)

## 2024-04-15 LAB — TROPONIN I (HIGH SENSITIVITY)
Troponin I (High Sensitivity): 7 ng/L (ref <18)
Troponin I (High Sensitivity): 7 ng/L (ref <18)

## 2024-04-15 MED ORDER — INSULIN ASPART 100 UNIT/ML IJ SOLN
5.0000 [IU] | Freq: Once | INTRAMUSCULAR | Status: AC
  Administered 2024-04-15: 5 [IU] via INTRAVENOUS
  Filled 2024-04-15: qty 1

## 2024-04-15 MED ORDER — LACTATED RINGERS IV BOLUS
500.0000 mL | Freq: Once | INTRAVENOUS | Status: AC
  Administered 2024-04-15: 500 mL via INTRAVENOUS

## 2024-04-15 NOTE — ED Triage Notes (Signed)
 Arrived by [facility] From home with c/o near syncope  EMS vitals: 101HR 157/93 b/p [id number]

## 2024-04-15 NOTE — ED Provider Notes (Signed)
 [facility] Provider Note    Event Date/Time   First MD Initiated Contact with Patient 04/15/24 [phone]     (approximate)   History   Dizziness   HPI  Mary Velasquez is a 43 year old female with history T2DM, nonischemic cardiomyopathy, recurrent syncope presenting to the emergency department for evaluation following a syncopal episode.  Patient was getting ready for work when she felt lightheaded and then thinks she passed out.  Denies head injury.  Reports she has frequent episodes of dizziness is usually able to catch the episode by sitting down and relaxing.  Denies chest pain, shortness of breath.  No longer using midodrine .  Has not recently required Lasix , uses as needed based on her swelling.  Reports she wore a ZIO monitor recently and mailed in her monitor but did not get results.  Reviewed discharge summary from 12/08/2023.  At that time patient presented with recurrent syncope with concerns for possible arrhythmia genic syncope, though discharge it was felt her episodes were more likely related to orthostasis.  Reviewed cardiology visit from 03/03/2024.  At that time she was noted to be euvolemic, noted to have low blood pressures in the morning.  Her Zio patch results were pending at that time.     Physical Exam   Triage Vital Signs: ED Triage Vitals  Encounter Vitals Group     BP 04/15/24 0801 (!) 135/94     Girls Systolic BP Percentile --      Girls Diastolic BP Percentile --      Boys Systolic BP Percentile --      Boys Diastolic BP Percentile --      Pulse Rate 04/15/24 0801 (!) 102     Resp 04/15/24 0801 16     Temp 04/15/24 0801 97.8 F (36.6 C)     Temp Source 04/15/24 0801 Oral     SpO2 04/15/24 0801 99 %     Weight 04/15/24 0802 182 lb 1.6 oz (82.6 kg)     Height --      Head Circumference --      Peak Flow --      Pain Score 04/15/24 0802 0     Pain Loc --      Pain Education --      Exclude from Growth Chart --     Most  recent vital signs: Vitals:   04/15/24 1000 04/15/24 1030  BP: (!) 139/116 (!) 158/106  Pulse: 99 (!) 102  Resp:  18  Temp:  97.8 F (36.6 C)  SpO2: 100% 97%     General: Awake, interactive  CV:  Good peripheral perfusion Resp:  Unlabored respirations, lungs clear to auscultation Abd:  Nondistended.  Neuro:  Symmetric facial movement, fluid speech, 5-5 strength of bilateral upper and lower extremities with normal sensation   ED Results / Procedures / Treatments   Labs (all labs ordered are listed, but only abnormal results are displayed) Labs Reviewed  COMPREHENSIVE METABOLIC PANEL WITH GFR - Abnormal; Notable for the following components:      Result Value   Sodium 133 (*)    Chloride 96 (*)    Glucose, Bld 410 (*)    BUN 22 (*)    Creatinine, Ser 1.05 (*)    Albumin  3.3 (*)    All other components within normal limits  CBC - Abnormal; Notable for the following components:   Hemoglobin 10.9 (*)    HCT 34.5 (*)    All other  components within normal limits  URINALYSIS, ROUTINE W REFLEX MICROSCOPIC - Abnormal; Notable for the following components:   Color, Urine COLORLESS (*)    APPearance CLEAR (*)    Glucose, UA >=500 (*)    All other components within normal limits  CBG MONITORING, ED - Abnormal; Notable for the following components:   Glucose-Capillary 420 (*)    All other components within normal limits  CBG MONITORING, ED - Abnormal; Notable for the following components:   Glucose-Capillary 336 (*)    All other components within normal limits  PREGNANCY, URINE  TROPONIN I (HIGH SENSITIVITY)  TROPONIN I (HIGH SENSITIVITY)     EKG EKG independently reviewed and interpreted by myself demonstrates:  EKG demonstrate sinus tachycardia at a rate of 101, PR 144, QRS 84, QTc 469, no acute ST changes  RADIOLOGY Imaging independently reviewed and interpreted by myself demonstrates:   Formal Radiology Read:  No results found.  PROCEDURES:  Critical Care  performed: No  Procedures   MEDICATIONS ORDERED IN ED: Medications  lactated ringers  bolus 500 mL (0 mLs Intravenous Stopped 04/15/24 1039)  insulin  aspart (novoLOG ) injection 5 Units (5 Units Intravenous Given 04/15/24 1005)     IMPRESSION / MDM / ASSESSMENT AND PLAN / ED COURSE  I reviewed the triage vital signs and the nursing notes.  Differential diagnosis includes, but is not limited to, anemia, electrolyte abnormality, hyperglycemia, dehydration, vasovagal syncope, consideration for arrhythmia, cardiac syncope  Patient's presentation is most consistent with acute presentation with potential threat to life or bodily function.  43 year old female presenting following a syncopal episode.  Reports ongoing history of lightheadedness with multiple prior syncopal episodes that she has been seen for previously.  Mild tachycardia on presentation here. Will obtain labs to further evaluate.   Clinical Course as of 04/15/24 1220  Thu Apr 15, 2024  1040 Labs with CBC with normal white blood cell count, stable anemia.  CMP notable for hyperglycemia without evidence of DKA.  Negative initial troponin.  Negative UPT.  Urine without evidence of infection.  Clinical history is somewhat concerning for orthostasis.  Patient was given IV fluids and insulin .  Repeat BGL 336.  Patient does report some ongoing lightheadedness on reevaluation but reports this is not atypical for her.  Discussed multiple options including admission, discharge with close outpatient follow-up.  After shared decision making we will plan for repeat troponin with potential discharge and close outpatient follow-up if patient feels improved. [NR]  1213 Repeat troponin remains within normal limits.  Patient does feel symptomatically improved on reevaluation.  Once again considered and discussed admission but patient does prefer discharge home.  Do think this is reasonable.  She will reach out to her heart failure team to arrange close  follow-up and to discuss obtaining results of her ZIO monitor.  Strict return precautions provided.  Patient discharged in stable condition. [NR]    Clinical Course User Index [NR] Levander Slate, MD     FINAL CLINICAL IMPRESSION(S) / ED DIAGNOSES   Final diagnoses:  Syncope and collapse     Rx / DC Orders   ED Discharge Orders     None        Note:  This document was prepared using Dragon voice recognition software and may include unintentional dictation errors.   Levander Slate, MD 04/15/24 [phone]

## 2024-04-15 NOTE — Discharge Instructions (Signed)
 You were seen in the emergency department today after passing out.  Your testing fortunately did not show an emergency cause for this.  Please follow-up with your primary care doctor and heart failure teams for further evaluation and to discuss obtaining the results of your ZIO monitor or repeating this.  Return to the ER for any new or worsening symptoms.

## 2024-04-15 NOTE — ED Notes (Signed)
 Pt did not ambulate well. Pt stood, felt dizzy, started walking and felt woozie   pt did not complete ambulation as expected.

## 2024-04-15 NOTE — ED Triage Notes (Addendum)
 Arrives c/o feeling lightheaded while getting ready for work.  States has had similar episodes in the past and has been seen through ED for same.  AAOx3.  Skin warm and dry. C/O feeling like she needs to cough.

## 2024-04-28 ENCOUNTER — Telehealth: Payer: Self-pay | Admitting: Family

## 2024-04-28 NOTE — Telephone Encounter (Signed)
 Called to confirm/remind patient of their appointment at the [facility] on 04/29/24.   Appointment:   [x] Confirmed  [] Left mess   [] No answer/No voice mail  [] VM Full/unable to leave message  [] Phone not in service  Patient reminded to bring all medications and/or complete list.  Confirmed patient has transportation. Gave directions, instructed to utilize valet parking.

## 2024-04-29 ENCOUNTER — Other Ambulatory Visit (HOSPITAL_COMMUNITY): Payer: Self-pay | Admitting: Cardiology

## 2024-04-29 ENCOUNTER — Other Ambulatory Visit: Payer: Self-pay

## 2024-04-29 ENCOUNTER — Encounter: Payer: Self-pay | Admitting: Family

## 2024-04-29 ENCOUNTER — Ambulatory Visit: Attending: Family | Admitting: Family

## 2024-04-29 ENCOUNTER — Ambulatory Visit (HOSPITAL_COMMUNITY)
Admission: RE | Admit: 2024-04-29 | Discharge: 2024-04-29 | Disposition: A | Discharge status: Home / Self Care | Source: Ambulatory Visit | Attending: Cardiology | Admitting: Cardiology

## 2024-04-29 VITALS — BP 166/112 | Ht 60.0 in | Wt 187.0 lb

## 2024-04-29 DIAGNOSIS — Z79899 Other long term (current) drug therapy: Secondary | ICD-10-CM | POA: Diagnosis not present

## 2024-04-29 DIAGNOSIS — Z8249 Family history of ischemic heart disease and other diseases of the circulatory system: Secondary | ICD-10-CM | POA: Insufficient documentation

## 2024-04-29 DIAGNOSIS — I428 Other cardiomyopathies: Secondary | ICD-10-CM | POA: Insufficient documentation

## 2024-04-29 DIAGNOSIS — Z59869 Financial insecurity, unspecified: Secondary | ICD-10-CM | POA: Insufficient documentation

## 2024-04-29 DIAGNOSIS — Z794 Long term (current) use of insulin: Secondary | ICD-10-CM | POA: Diagnosis not present

## 2024-04-29 DIAGNOSIS — E1142 Type 2 diabetes mellitus with diabetic polyneuropathy: Secondary | ICD-10-CM | POA: Diagnosis not present

## 2024-04-29 DIAGNOSIS — R55 Syncope and collapse: Secondary | ICD-10-CM | POA: Diagnosis present

## 2024-04-29 DIAGNOSIS — D649 Anemia, unspecified: Secondary | ICD-10-CM | POA: Diagnosis not present

## 2024-04-29 DIAGNOSIS — R000 Tachycardia, unspecified: Secondary | ICD-10-CM

## 2024-04-29 DIAGNOSIS — R079 Chest pain, unspecified: Secondary | ICD-10-CM | POA: Insufficient documentation

## 2024-04-29 DIAGNOSIS — I5032 Chronic diastolic (congestive) heart failure: Secondary | ICD-10-CM

## 2024-04-29 DIAGNOSIS — E1165 Type 2 diabetes mellitus with hyperglycemia: Secondary | ICD-10-CM | POA: Diagnosis not present

## 2024-04-29 DIAGNOSIS — Z7984 Long term (current) use of oral hypoglycemic drugs: Secondary | ICD-10-CM | POA: Diagnosis not present

## 2024-04-29 DIAGNOSIS — Z90710 Acquired absence of both cervix and uterus: Secondary | ICD-10-CM | POA: Diagnosis not present

## 2024-04-29 DIAGNOSIS — I5020 Unspecified systolic (congestive) heart failure: Secondary | ICD-10-CM

## 2024-04-29 DIAGNOSIS — E119 Type 2 diabetes mellitus without complications: Secondary | ICD-10-CM | POA: Diagnosis not present

## 2024-04-29 DIAGNOSIS — Q282 Arteriovenous malformation of cerebral vessels: Secondary | ICD-10-CM | POA: Diagnosis not present

## 2024-04-29 DIAGNOSIS — Z6836 Body mass index (BMI) 36.0-36.9, adult: Secondary | ICD-10-CM | POA: Insufficient documentation

## 2024-04-29 DIAGNOSIS — E669 Obesity, unspecified: Secondary | ICD-10-CM | POA: Insufficient documentation

## 2024-04-29 DIAGNOSIS — I5022 Chronic systolic (congestive) heart failure: Secondary | ICD-10-CM | POA: Insufficient documentation

## 2024-04-29 MED ORDER — METOPROLOL SUCCINATE ER 25 MG PO TB24
75.0000 mg | ORAL_TABLET | Freq: Every day | ORAL | 3 refills | Status: AC
  Filled 2024-04-29: qty 90, 30d supply, fill #0

## 2024-04-29 MED ORDER — JARDIANCE 10 MG PO TABS
10.0000 mg | ORAL_TABLET | Freq: Every day | ORAL | 11 refills | Status: AC
  Filled 2024-04-29: qty 30, 30d supply, fill #0

## 2024-04-29 MED ORDER — FUROSEMIDE 40 MG PO TABS
40.0000 mg | ORAL_TABLET | ORAL | 3 refills | Status: AC | PRN
  Filled 2024-04-29: qty 30, 30d supply, fill #0

## 2024-04-29 NOTE — Patient Instructions (Addendum)
 It was good to see you today!  Medication Changes:  INCREASE Metoprolol  to 75mg  (3 tabs) daily  REFILLED Furosemide  and Jardiance   TODAY Take 1 tab of Furosemide   Lab Work:  Go downstairs to [facility] on [address] to have your blood work completed.  We will only call you if the results are abnormal or if the provider would like to make medication changes.  No news is good news.     Testing/Procedures:  Your provider has recommended that  you wear a Zio Patch for 7 days.  This monitor will record your heart rhythm for our review.  IF you have any symptoms while wearing the monitor please press the button.  If you have any issues with the patch or you notice a red or orange light on it please call the company at [phone].  Once you remove the patch please mail it back to the company as soon as possible so we can get the results.   Follow-Up in: Please follow up with the [facility] in 1 month with Ellouise Class, FNP   Thank you for choosing [facility].    At the [facility], you and your health needs are our priority. We have a designated team specialized in the treatment of Heart Failure. This Care Team includes your primary Heart Failure Specialized Cardiologist (physician), Advanced Practice Providers (APPs- Physician Assistants and Nurse Practitioners), and Pharmacist who all work together to provide you with the care you need, when you need it.   You may see any of the following providers on your designated Care Team at your next follow up:  Dr. Toribio Fuel Dr. Ezra Shuck Dr. Ria Commander Dr. Morene Brownie Ellouise Class, FNP Jaun Bash, RPH-CPP  Please be sure to bring in all your medications bottles to every appointment.   Need to Contact Us :  If you have any questions or concerns before your next appointment please send us  a message through [facility] or call our office at  [phone].    TO LEAVE A MESSAGE FOR THE NURSE SELECT OPTION 2, PLEASE LEAVE A MESSAGE INCLUDING: YOUR NAME DATE OF BIRTH CALL BACK NUMBER REASON FOR CALL**this is important as we prioritize the call backs  YOU WILL RECEIVE A CALL BACK THE SAME DAY AS LONG AS YOU CALL BEFORE 4:00 PM

## 2024-04-29 NOTE — Progress Notes (Unsigned)
 [facility] Note    PCP: Cyrus Selinda Moose, PA-C HF cardiology: Rolan Barrack, MD  Chief Complaint: shortness of breath   HPI:  Ms Bomkamp is a 43 y.o. female with a history of type 2 diabetes, hysterectomy complicated by post-op anemia requiring blood transfusion, inappropriate sinus tachycardia, brain AVM, obesity, anemia, peripheral neuropathy, recurrent syncope and chronic systolic heart failure. Has paternal first cousin who died suddenly in her 30s from cardiac arrest.    Echo 10/2022 showed EF 60-65% without RWMA, mild LVH, G1DD, normal RV systolic function and sive, small pericardial effusion present, with RA pressure 3 mmHg.   Admitted 10/06/23 with dizziness and syncope. 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 When she came to, she was laying on the ground and the resident was yelling her name. BNP was 235, HS-troponin was 35. CTA chest was negative for PE, but showed cardiomegaly, pulmonary edema, small bilateral pleural effusions, and dilated pulmonary artery. 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 Echocardiogram 10/07/23 showed LVEF 30-35%, mild LVH, GLS of -6.6%, RV function mildly reduced with moderately elevate PA systolic pressure, moderate MR, mild AR, moderate TR. EP consulted. LHC/RHC showed no significant CAD; CI 2.89, elevated filling pressures. Cardiac MRI showed LV EF 35%, mild RV dysfunction, no LGE.   Admitted 12/06/23 due to syncopal episode at home. She had another syncopal episode while in the ED. EKG showed ST with borderline prolonged QT interval. Given IVF, cardiology consulted. Syncope thought to be due  to over-diuresis. Was unable to tolerate previous jardiance  / losartan  because of hypotension. Found to have vitamin D / B12 deficiency. Zio monitor was supposed to be mailed to her home but she says that she never received.   Echo in 7/25 showed EF up to 50-55% with mild  LVH, normal RV, normal IVC.   Was in the ED 04/15/24 due to syncopal episode. Patient was getting ready for work when she felt lightheaded and then thinks she passed out. Denies head injury. CMP notable for hyperglycemia without evidence of DKA. Urine without infection. Given IVF and insulin . Repeat glucose was 336. Repeat troponin negative.  She presents today for a HF follow-up visit with a chief complaint of shortness of breath. Has associated fatigue, occasional chest pain, palpitations, occasional dizziness, pedal edema. She says that she mailed back her zio but has not received any results. Glucose has been elevated at home.   Has not taken any lasix  since right before recent ED visit.   ECG not done  Labs (6/25): K 3.5, creatinine 0.84  Labs (8/25): K 4.0, creatinine 1.58, GFR 41, BNP 27.4, A1c 10.4% Labs (9/25): K 4.5, creatinine 0.92, glucose 395 Labs (10/25): K 4.5, creatinine 1.05, glucose 410, Hg 10.9  ROS: All systems negative except what is listed in HPI, PMH and Problem List   Past Medical History:  Diagnosis Date   Anemia    AVM (arteriovenous malformation)    Diabetes mellitus without complication (HCC)    History of kidney stones    Peripheral neuropathy    Pneumonia    Tachycardia     Current Outpatient Medications  Medication Sig Dispense Refill   Blood Glucose Monitoring Suppl DEVI 1 each by Does not apply route 3 (three) times daily. May dispense any manufacturer covered by patient's insurance. 1 each 0   cyanocobalamin  1000 MCG tablet Take 1 tablet (1,000 mcg total) by mouth daily. 90 tablet 1  ferrous sulfate  325 (65 FE) MG EC tablet Take 1 tablet (325 mg total) by mouth 2 (two) times daily. 60 tablet 3   furosemide  (LASIX ) 40 MG tablet Take 1 tablet (40 mg total) by mouth as needed. 45 tablet 3   gabapentin  (NEURONTIN ) 300 MG capsule Take 600 mg by mouth at bedtime.     Glucose Blood (BLOOD GLUCOSE TEST STRIPS) STRP 1 each by Does not apply route 3  (three) times daily. Use as directed to check blood sugar. May dispense any manufacturer covered by patient's insurance and fits patient's device. 100 strip 0   insulin  aspart (NOVOLOG ) 100 UNIT/ML FlexPen Inject 4 Units into the skin 3 (three) times daily with meals. Only take if eating a meal AND Blood Glucose (BG) is 80 or higher. 3 mL 2   insulin  glargine (LANTUS ) 100 UNIT/ML Solostar Pen Inject 14 Units into the skin at bedtime. May substitute as needed per insurance. 4.2 mL 2   Insulin  Pen Needle (PEN NEEDLES) 31G X 5 MM MISC 1 each by Does not apply route 3 (three) times daily. May dispense any manufacturer covered by patient's insurance. 100 each 0   JARDIANCE  10 MG TABS tablet Take 1 tablet (10 mg total) by mouth daily before breakfast. 30 tablet 11   Lancet Device MISC 1 each by Does not apply route 3 (three) times daily. May dispense any manufacturer covered by patient's insurance. 1 each 0   Lancets MISC 1 each by Does not apply route 3 (three) times daily. Use as directed to check blood sugar. May dispense any manufacturer covered by patient's insurance and fits patient's device. 100 each 0   metFORMIN  (GLUCOPHAGE ) 1000 MG tablet Take 1,000 mg by mouth 2 (two) times daily with a meal. Breakfast & supper     metoprolol  succinate (TOPROL -XL) 25 MG 24 hr tablet Take 2 tablets (50 mg total) by mouth daily. Take with or immediately following a meal. 60 tablet 11   nortriptyline  (PAMELOR ) 50 MG capsule Take 100 mg by mouth at bedtime.     spironolactone  (ALDACTONE ) 25 MG tablet Take 0.5 tablets (12.5 mg total) by mouth daily. (Patient not taking: Reported on 03/03/2024) 15 tablet 5   No current facility-administered medications for this visit.    Allergies  Allergen Reactions   Oxycodone Anaphylaxis and Swelling    Throat swelling   Oxycontin [Oxycodone Hcl] Anaphylaxis and Swelling    Throat swelling       Social History   Socioeconomic History   Marital status: Single    Spouse  name: Not on file   Number of children: 2   Years of education: Not on file   Highest education level: Some college, no degree  Occupational History   Not on file  Tobacco Use   Smoking status: Never   Smokeless tobacco: Never  Vaping Use   Vaping status: Never Used  Substance and Sexual Activity   Alcohol use: No   Drug use: Not Currently   Sexual activity: Not on file  Other Topics Concern   Not on file  Social History Narrative   Lives alone   Social Drivers of Health   Financial Resource Strain: High Risk (10/09/2023)   Overall Financial Resource Strain (CARDIA)    Difficulty of Paying Living Expenses: Hard  Food Insecurity: No Food Insecurity (12/07/2023)   Hunger Vital Sign    Worried About Running Out of Food in the Last Year: Never true    Ran Out of  Food in the Last Year: Never true  Transportation Needs: No Transportation Needs (12/07/2023)   PRAPARE - Administrator, Civil Service (Medical): No    Lack of Transportation (Non-Medical): No  Physical Activity: Not on file  Stress: Not on file  Social Connections: Not on file  Intimate Partner Violence: Not At Risk (12/07/2023)   Humiliation, Afraid, Rape, and Kick questionnaire    Fear of Current or Ex-Partner: No    Emotionally Abused: No    Physically Abused: No    Sexually Abused: No      Family History  Problem Relation Age of Onset   Heart disease Paternal Aunt    Heart attack Maternal Grandfather    Heart disease Maternal Grandfather    Heart attack Cousin    Heart disease Cousin    CAD Neg Hx    Seizures Neg Hx     Vitals:   04/29/24 1411  BP: (!) 166/112  SpO2: 100%  Weight: 187 lb (84.8 kg)  Height: 5' (1.524 m)   Wt Readings from Last 3 Encounters:  04/29/24 187 lb (84.8 kg)  04/15/24 182 lb 1.6 oz (82.6 kg)  03/03/24 182 lb 2 oz (82.6 kg)   Lab Results  Component Value Date   CREATININE 0.96 04/29/2024   CREATININE 1.05 (H) 04/15/2024   CREATININE 0.92 03/03/2024    PHYSICAL EXAM:  General: Well appearing.  Cor: No JVD. Regular rhythm, tachycardic  Lungs: clear Abdomen: soft, nontender, nondistended. Extremities: trace pitting edema bilateral lower legs Neuro:. Affect pleasant   ASSESSMENT & PLAN:  1: HF with recovered EF: Nonischemic cardiomyopathy.  Echo in 4/25 with LVEF 30-35%, mild LVH, GLS of -6.6%, RV function mildly reduced with moderately elevate PA systolic pressure, moderate MR, mild AR, moderate TR.  LHC in 4/25 showed no significant CAD. Cardiac MRI in 4/25 showed LV EF 35%, mild RV dysfunction, no LGE.  Echo done in 7/25 showed significant recovery of LV function with EF up to 50-55% with mild LVH, normal RV.   - NYHA class II symptoms.  - mildly fluid up with symptoms/ weight gain - Weight up 5 pounds from last visit here 6 weeks ago - Prevention genetics testing for hereditary cardiomyopathies was indeterminate. Reviewed what this meant and offered genetic counseling if she would like. She deferred today. She had a 1st cousin with sudden death at a young age.  - GDMT has been limited by low BP.  - has furosemide  40mg  PRN and hasn't taken since recent admission. Advised to take it today.  - Continue Jardiance  10 mg daily.  BMET today - Increase Toprol  XL to 75 mg daily due to tachycardic. This should not affect her BP too much.  - Off spironolactone  due to low BP - Off losartan  due to low BP. She is getting low BP's at home in the mornings and then it rises as the day progresses  - BMET 04/15/24 reviewed: sodium 133, potassium 4.5, creatinine 1.05, GFR >60. Recheck BMET today 2: Syncope: 4/25 and 6/25.  No arrhythmic events noted on telemetry in hospital.  Chronic mild sinus tachycardia.  EF now recovered. She has been orthostatic in the past.  - 2 week zio 02/10/24, returned 02/26/24. No results available and [facility] notes not receiving device. - zio applied today and instructed her to bring device to our office and we will mail it in.   3. Type 2 diabetes: Per PCP.  - A1c 10/09/23 was 10.4%  Return in 1 month, sooner if needed.   I spent 20 minutes reviewing records, interviewing/ examing patient and managing plan/ orders.     Ellouise DELENA Class, FNP 04/29/24

## 2024-04-30 ENCOUNTER — Ambulatory Visit: Payer: Self-pay | Admitting: Family

## 2024-04-30 ENCOUNTER — Encounter: Payer: Self-pay | Admitting: Family

## 2024-04-30 LAB — BASIC METABOLIC PANEL WITH GFR
BUN/Creatinine Ratio: 19 (ref 9–23)
BUN: 18 mg/dL (ref 6–24)
CO2: 22 mmol/L (ref 20–29)
Calcium: 9.9 mg/dL (ref 8.7–10.2)
Chloride: 94 mmol/L — ABNORMAL LOW (ref 96–106)
Creatinine, Ser: 0.96 mg/dL (ref 0.57–1.00)
Glucose: 157 mg/dL — ABNORMAL HIGH (ref 70–99)
Potassium: 4.5 mmol/L (ref 3.5–5.2)
Sodium: 134 mmol/L (ref 134–144)
eGFR: 75 mL/min/1.73 (ref >59)

## 2024-05-13 ENCOUNTER — Emergency Department
Admission: EM | Admit: 2024-05-13 | Discharge: 2024-05-13 | Disposition: A | Discharge status: Home / Self Care | Attending: Emergency Medicine | Admitting: Emergency Medicine

## 2024-05-13 ENCOUNTER — Encounter: Payer: Self-pay | Admitting: Emergency Medicine

## 2024-05-13 ENCOUNTER — Other Ambulatory Visit: Payer: Self-pay

## 2024-05-13 ENCOUNTER — Emergency Department

## 2024-05-13 DIAGNOSIS — S022XXA Fracture of nasal bones, initial encounter for closed fracture: Secondary | ICD-10-CM | POA: Diagnosis present

## 2024-05-13 DIAGNOSIS — E162 Hypoglycemia, unspecified: Secondary | ICD-10-CM

## 2024-05-13 DIAGNOSIS — I509 Heart failure, unspecified: Secondary | ICD-10-CM | POA: Diagnosis not present

## 2024-05-13 DIAGNOSIS — R55 Syncope and collapse: Secondary | ICD-10-CM | POA: Diagnosis not present

## 2024-05-13 DIAGNOSIS — J45909 Unspecified asthma, uncomplicated: Secondary | ICD-10-CM | POA: Insufficient documentation

## 2024-05-13 DIAGNOSIS — W1830XA Fall on same level, unspecified, initial encounter: Secondary | ICD-10-CM | POA: Diagnosis not present

## 2024-05-13 DIAGNOSIS — E11649 Type 2 diabetes mellitus with hypoglycemia without coma: Secondary | ICD-10-CM | POA: Diagnosis not present

## 2024-05-13 LAB — URINALYSIS, ROUTINE W REFLEX MICROSCOPIC
Bilirubin Urine: NEGATIVE
Glucose, UA: >=500 mg/dL — AB
Ketones, ur: NEGATIVE mg/dL
Nitrite: NEGATIVE
Protein, ur: NEGATIVE mg/dL
Specific Gravity, Urine: 1.026 (ref 1.005–1.030)
pH: 5 (ref 5.0–8.0)

## 2024-05-13 LAB — COMPREHENSIVE METABOLIC PANEL WITH GFR
ALT: 11 U/L (ref 0–44)
AST: 13 U/L — ABNORMAL LOW (ref 15–41)
Albumin: 3.5 g/dL (ref 3.5–5.0)
Alkaline Phosphatase: 138 U/L — ABNORMAL HIGH (ref 38–126)
Anion gap: 11 (ref 5–15)
BUN: 19 mg/dL (ref 6–20)
CO2: 27 mmol/L (ref 22–32)
Calcium: 9.3 mg/dL (ref 8.9–10.3)
Chloride: 97 mmol/L — ABNORMAL LOW (ref 98–111)
Creatinine, Ser: 1.19 mg/dL — ABNORMAL HIGH (ref 0.44–1.00)
GFR, Estimated: 58 mL/min — ABNORMAL LOW (ref >60)
Glucose, Bld: 291 mg/dL — ABNORMAL HIGH (ref 70–99)
Potassium: 3.8 mmol/L (ref 3.5–5.1)
Sodium: 135 mmol/L (ref 135–145)
Total Bilirubin: 0.2 mg/dL (ref 0.0–1.2)
Total Protein: 7.8 g/dL (ref 6.5–8.1)

## 2024-05-13 LAB — CBG MONITORING, ED
Glucose-Capillary: 223 mg/dL — ABNORMAL HIGH (ref 70–99)
Glucose-Capillary: 137 mg/dL — ABNORMAL HIGH (ref 70–99)

## 2024-05-13 LAB — CBC
HCT: 34 % — ABNORMAL LOW (ref 36.0–46.0)
Hemoglobin: 11 g/dL — ABNORMAL LOW (ref 12.0–15.0)
MCH: 28 pg (ref 26.0–34.0)
MCHC: 32.4 g/dL (ref 30.0–36.0)
MCV: 86.5 fL (ref 80.0–100.0)
Platelets: 320 K/uL (ref 150–400)
RBC: 3.93 MIL/uL (ref 3.87–5.11)
RDW: 13 % (ref 11.5–15.5)
WBC: 11.8 K/uL — ABNORMAL HIGH (ref 4.0–10.5)
nRBC: 0 % (ref 0.0–0.2)

## 2024-05-13 MED ORDER — KETOROLAC TROMETHAMINE 30 MG/ML IJ SOLN
15.0000 mg | Freq: Once | INTRAMUSCULAR | Status: AC
  Administered 2024-05-13: 15 mg via INTRAVENOUS
  Filled 2024-05-13: qty 1

## 2024-05-13 NOTE — ED Notes (Signed)
 See triage note  Presents with family  S/p syncopal episode  States she fell face first at work   Having pain to head,neck and jaw line

## 2024-05-13 NOTE — Discharge Instructions (Signed)
Use Tylenol for pain and fevers.  Up to 1000 mg per dose, up to 4 times per day.  Do not take more than 4000 mg of Tylenol/acetaminophen within 24 hours..  

## 2024-05-13 NOTE — ED Provider Notes (Signed)
 [facility] Provider Note    Event Date/Time   First MD Initiated Contact with Patient 05/13/24 1159     (approximate)   History   Loss of Consciousness   HPI  Mary Velasquez is a 43 y.o. female who presents to the ED for evaluation of Loss of Consciousness   I review a ED visit from 1 month ago where patient was seen for syncope, as well as a CHF clinic visit from 2 weeks ago. Obese patient with history of DM, nonischemic cardiomyopathy with recovered EF and recurrent syncope.  Admission for syncope back in April, clean left/right heart cath, admission for syncope in June  Patient presents to the ED alongside her sister for evaluation of dizziness and a syncopal episode that occurred this morning while she was at work.  Works at a local [employer].  Reports this morning that she was rushed on her way to work and did not have breakfast, which she typically does.  She was busy at work and she reports extra dizziness than normal this morning.  She checked her fingerstick glucose while at work and it was in the 40s, she sat down to eat some cake to help with her glucose.  Shortly after this she had to get up to go ask someone to question and she reports increased dizziness with standing and before she could make it to her destination she had a syncopal episode.   She reports a degree of chronic presyncopal dizziness that was worse this morning in the setting of her low glucose.  She reports her glucose has been okay prior to this and has not had any other instances of hypoglycemia.  She denies medication changes or additional symptoms, illnesses or other concerns.  Prior to my evaluation with EMS and triage she had an IV placed and 1 L of IV fluids she reports after this her symptoms have resolved.  She has ambulated to and from the bathroom without dizziness.   She reports left facial and jaw pain from fall/syncope earlier today.  No preceding pain like chest pain or  headache before the fall.  Physical Exam   Triage Vital Signs: ED Triage Vitals  Encounter Vitals Group     BP 05/13/24 1004 92/74     Girls Systolic BP Percentile --      Girls Diastolic BP Percentile --      Boys Systolic BP Percentile --      Boys Diastolic BP Percentile --      Pulse Rate 05/13/24 1004 (!) 106     Resp 05/13/24 1004 18     Temp 05/13/24 1004 98 F (36.7 C)     Temp Source 05/13/24 1004 Oral     SpO2 05/13/24 1004 100 %     Weight 05/13/24 1002 175 lb (79.4 kg)     Height 05/13/24 1002 5' (1.524 m)     Head Circumference --      Peak Flow --      Pain Score 05/13/24 1001 8     Pain Loc --      Pain Education --      Exclude from Growth Chart --     Most recent vital signs: Vitals:   05/13/24 1004 05/13/24 1108  BP: 92/74 (!) 135/91  Pulse: (!) 106 (!) 105  Resp: 18 18  Temp: 98 F (36.7 C) 98.2 F (36.8 C)  SpO2: 100% 100%    General: Awake, no distress.  CV:  Good peripheral perfusion.  Resp:  Normal effort.  Abd:  No distention.  MSK:  No deformity noted.  Neuro:  No focal deficits appreciated. Other:     ED Results / Procedures / Treatments   Labs (all labs ordered are listed, but only abnormal results are displayed) Labs Reviewed  COMPREHENSIVE METABOLIC PANEL WITH GFR - Abnormal; Notable for the following components:      Result Value   Chloride 97 (*)    Glucose, Bld 291 (*)    Creatinine, Ser 1.19 (*)    AST 13 (*)    Alkaline Phosphatase 138 (*)    GFR, Estimated 58 (*)    All other components within normal limits  CBC - Abnormal; Notable for the following components:   WBC 11.8 (*)    Hemoglobin 11.0 (*)    HCT 34.0 (*)    All other components within normal limits  URINALYSIS, ROUTINE W REFLEX MICROSCOPIC - Abnormal; Notable for the following components:   Color, Urine YELLOW (*)    APPearance HAZY (*)    Glucose, UA >=500 (*)    Hgb urine dipstick LARGE (*)    Leukocytes,Ua TRACE (*)    Bacteria, UA RARE (*)     All other components within normal limits  CBG MONITORING, ED - Abnormal; Notable for the following components:   Glucose-Capillary 223 (*)    All other components within normal limits  CBG MONITORING, ED - Abnormal; Notable for the following components:   Glucose-Capillary 137 (*)    All other components within normal limits  CBG MONITORING, ED    EKG Sinus rhythm with a rate of 107 bpm.  Normal axis, QTc 469, no clear signs of acute ischemia.  Comparison from 1 month ago with similar morphology.  RADIOLOGY CT head interpreted by me without evidence of acute intracranial pathology CT cervical spine interpreted by me without evidence of fracture or dislocation CT maxillofacial interpreted by me of nasal bone fracture  Official radiology report(s): CT Maxillofacial Wo Contrast Result Date: 05/13/2024 EXAM: CT OF THE FACE WITHOUT CONTRAST 05/13/2024 01:25:40 PM TECHNIQUE: CT of the face was performed without the administration of intravenous contrast. Multiplanar reformatted images are provided for review. Automated exposure control, iterative reconstruction, and/or weight based adjustment of the mA/kV was utilized to reduce the radiation dose to as low as reasonably achievable. COMPARISON: None available. CLINICAL HISTORY: left facial and mandibular pain after fall FINDINGS: FACIAL BONES: Nondisplaced fracture of the left nasal bone extending into the nasal bridge. There is an additional mildly displaced fracture of the right nasal bone with slight lateral apex angulation. No additional acute maxillofacial fracture noted. No mandibular condyle dislocation. Periapical lucency involving the right maxillary 2nd molar with dehiscence of the adjacent floor of the right maxillary sinus. There is an additional periapical lucency of the left mandibular 2nd molar. The lucency appears to involve the canal for the inferior alveolar nerve. No suspicious bone lesion. ORBITS: Globes are intact. No acute  traumatic injury. No inflammatory change. SINUSES AND MASTOIDS: Mucosal thickening throughout the right maxillary sinus most pronounced inferiorly. Additional mild mucosal thickening in the alveolar recess of the left maxillary sinus. No air fluid levels in the sinuses. No acute abnormality in the remaining visualized sinuses and mastoids. SOFT TISSUES: Mild focal stranding in the left facial soft tissues overlying the maxilla which is likely posttraumatic etiology. No evidence of hematoma visualized. Airway in the upper neck is unremarkable. IMPRESSION: 1. Nondisplaced fracture of the  left nasal bone extending into the nasal bridge and mildly displaced fracture of the right nasal bone with slight lateral apex angulation. No significant overlying edema. Recommend correlation with tenderness. 2. Mild focal stranding in the left facial soft tissues overlying the maxilla, likely posttraumatic. 3. Periapical lucency involving the right maxillary 2nd molar with dehiscence of the adjacent floor of the right maxillary sinus and associated right maxillary sinus mucosal thickening. 4. Periapical lucency of the left mandibular 2nd molar involving the canal for the inferior alveolar nerve. Electronically signed by: Donnice Mania MD 05/13/2024 02:31 PM EST RP Workstation: [id number]   CT Cervical Spine Wo Contrast Result Date: 05/13/2024 EXAM: CT CERVICAL SPINE WITHOUT CONTRAST 05/13/2024 01:25:40 PM TECHNIQUE: CT of the cervical spine was performed without the administration of intravenous contrast. Multiplanar reformatted images are provided for review. Automated exposure control, iterative reconstruction, and/or weight based adjustment of the mA/kV was utilized to reduce the radiation dose to as low as reasonably achievable. COMPARISON: MRI cervical spine 05/14/2020 CLINICAL HISTORY: Syncope, fall, left facial and mandibular pain. FINDINGS: CERVICAL SPINE: BONES AND ALIGNMENT: Straightening of the normal cervical  lordosis. No acute fracture or traumatic malalignment. 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 There is mild spinal canal stenosis noted at the C4-C5 and C5-C6 levels. Mild degenerative endplate osteophytes. Mild facet arthrosis and uncovertebral hypertrophy at multiple levels. Foraminal stenosis noted at C5-C6. SOFT TISSUES: Patulous appearance of the upper thoracic esophagus with layering debris within the lumen. Recommend correlation with symptoms of reflux and dysphagia. No prevertebral soft tissue swelling. IMPRESSION: 1. No acute fracture or traumatic malalignment of the cervical spine. 2. Degenerative changes as above. 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 Mild spinal canal stenosis at C4-5 and C5-6. 3. Patulous upper thoracic esophagus with intraluminal debris, which may reflect esophageal dysmotility or reflux. Consider nonurgent clinical evaluation if symptomatic. Electronically signed by: Donnice Mania MD 05/13/2024 02:18 PM EST RP Workstation: [id number]   CT HEAD WO CONTRAST ( ) Result Date: 05/13/2024 EXAM: CT HEAD WITHOUT CONTRAST 05/13/2024 01:25:40 PM TECHNIQUE: CT of the head was performed without the administration of intravenous contrast. Automated exposure control, iterative reconstruction, and/or weight based adjustment of the mA/kV was utilized to reduce the radiation dose to as low as reasonably achievable. COMPARISON: MRI head 10/06/2023. CLINICAL HISTORY: syncope, fall, left facial pain FINDINGS: BRAIN AND VENTRICLES: No acute hemorrhage. No evidence of acute infarct. No hydrocephalus. No extra-axial collection. No mass effect or midline shift. 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 Partially empty configuration of the sella which may reflect a normal variant but can be seen with idiopathic intracranial hypertension in  the appropriate setting. ORBITS: No acute abnormality. SINUSES: No acute abnormality. SOFT TISSUES AND SKULL: No acute soft tissue abnormality. No skull fracture. IMPRESSION: 1. No acute intracranial abnormality. 2. Partially empty sella, possibly a normal variant, but can be seen with idiopathic intracranial hypertension in the appropriate setting. 3. 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 Electronically signed by: Donnice Mania MD 05/13/2024 02:09 PM EST RP Workstation: [id number]    PROCEDURES and INTERVENTIONS:  Procedures  Medications  ketorolac  (TORADOL ) 30 MG/ML injection 15 mg (15 mg Intravenous Given 05/13/24 1256)     IMPRESSION / MDM / ASSESSMENT AND PLAN / ED COURSE  I reviewed the triage vital signs and the nursing notes.  Differential diagnosis includes, but is not limited to, hypoglycemia, UTI, sepsis, symptomatic anemia, cardiac dysrhythmia, vasovagal episode, seizure, sulfonylurea use or insulin  overdose  {Patient presents with symptoms of an acute illness or injury that is potentially life-threatening.  Patient history of recurrent syncopal episodes presents to the ED after a syncopal episode at work associated with hypoglycemia.  Likely multifactorial in the setting of her hypoglycemia, vasovagal and no oral intake this morning.  Mild hyperglycemia here without acidosis or DKA.  Chronic mild normocytic anemia.   Reassuring imaging, nondisplaced nasal bone fractures noted and I told the patient about this, she did not have any primary nasal pain.  Considered admission for this patient but ultimately believe she would be suitable for outpatient management.  Discussed ED return precautions  Clinical Course as of 05/13/24 1443  Thu May 13, 2024  1227 No breakfast, low sugars, 46 at work, cake, stood up [DS]  1441 Reassessed, feeling well, improved pain, no recurrence of hypoglycemia, no further dizziness.  She is  ready to leave.  We discussed CT results, nasal bone fracture.  [DS]    Clinical Course User Index [DS] Claudene Rover, MD     FINAL CLINICAL IMPRESSION(S) / ED DIAGNOSES   Final diagnoses:  Syncope and collapse  Hypoglycemia  Closed fracture of nasal bone, initial encounter     Rx / DC Orders   ED Discharge Orders     None        Note:  This document was prepared using Dragon voice recognition software and may include unintentional dictation errors.   Claudene Rover, MD 05/13/24 [phone]

## 2024-05-13 NOTE — ED Triage Notes (Signed)
 First Nurse Note:  Pt via [facility] from work. Pt c/o dizziness and lightheadedness this AM, reports that her CBG was 46 this AM. While she was at work she had a syncopal episode and had a fall forward. Upon standing pt states she was dizzy. EMS reports Pt is A&OX4 and NAD and 96/66 was initial BP, 122/78 after some fluids, 105 HR, 98% on RA 325 CBG

## 2024-06-03 ENCOUNTER — Ambulatory Visit: Admitting: Family

## 2024-06-07 ENCOUNTER — Other Ambulatory Visit (HOSPITAL_COMMUNITY): Payer: Self-pay | Admitting: Cardiology

## 2024-06-18 ENCOUNTER — Telehealth: Payer: Self-pay | Admitting: Family

## 2024-06-18 NOTE — Telephone Encounter (Signed)
 Called to confirm/remind patient of their appointment at the [facility] on 06/21/24.   Appointment:   [x] Confirmed  [] Left mess   [] No answer/No voice mail  [] VM Full/unable to leave message  [] Phone not in service  Patient reminded to bring all medications and/or complete list.  Confirmed patient has transportation. Gave directions, instructed to utilize valet parking.

## 2024-06-21 ENCOUNTER — Ambulatory Visit: Attending: Cardiology | Admitting: Cardiology

## 2024-06-21 VITALS — BP 112/74 | HR 115 | Wt 192.4 lb

## 2024-06-21 DIAGNOSIS — Z79899 Other long term (current) drug therapy: Secondary | ICD-10-CM | POA: Insufficient documentation

## 2024-06-21 DIAGNOSIS — I5042 Chronic combined systolic (congestive) and diastolic (congestive) heart failure: Secondary | ICD-10-CM | POA: Insufficient documentation

## 2024-06-21 DIAGNOSIS — I5020 Unspecified systolic (congestive) heart failure: Secondary | ICD-10-CM | POA: Diagnosis not present

## 2024-06-21 DIAGNOSIS — E669 Obesity, unspecified: Secondary | ICD-10-CM | POA: Diagnosis not present

## 2024-06-21 DIAGNOSIS — Z794 Long term (current) use of insulin: Secondary | ICD-10-CM | POA: Insufficient documentation

## 2024-06-21 DIAGNOSIS — E1142 Type 2 diabetes mellitus with diabetic polyneuropathy: Secondary | ICD-10-CM | POA: Diagnosis not present

## 2024-06-21 DIAGNOSIS — R0683 Snoring: Secondary | ICD-10-CM | POA: Insufficient documentation

## 2024-06-21 DIAGNOSIS — Z8249 Family history of ischemic heart disease and other diseases of the circulatory system: Secondary | ICD-10-CM | POA: Insufficient documentation

## 2024-06-21 DIAGNOSIS — E119 Type 2 diabetes mellitus without complications: Secondary | ICD-10-CM

## 2024-06-21 DIAGNOSIS — R000 Tachycardia, unspecified: Secondary | ICD-10-CM | POA: Diagnosis not present

## 2024-06-21 DIAGNOSIS — I5032 Chronic diastolic (congestive) heart failure: Secondary | ICD-10-CM | POA: Diagnosis not present

## 2024-06-21 DIAGNOSIS — Z6837 Body mass index (BMI) 37.0-37.9, adult: Secondary | ICD-10-CM | POA: Diagnosis not present

## 2024-06-21 DIAGNOSIS — I428 Other cardiomyopathies: Secondary | ICD-10-CM | POA: Diagnosis not present

## 2024-06-21 DIAGNOSIS — Z7984 Long term (current) use of oral hypoglycemic drugs: Secondary | ICD-10-CM | POA: Diagnosis not present

## 2024-06-21 DIAGNOSIS — R55 Syncope and collapse: Secondary | ICD-10-CM | POA: Diagnosis not present

## 2024-06-21 NOTE — Patient Instructions (Signed)
 Medication Changes:  The pharmacist will be in contact with you about starting a GLP-1 medication.   Lab Work:   Go downstairs to [facility] on [address] to have your blood work completed.  We will only call you if the results are abnormal or if the provider would like to make medication changes.  No news is good news.   Follow-Up in: Please follow up with the [facility] in 3 months with Ellouise Class, FNP.   Thank you for choosing [facility].    At the [facility], you and your health needs are our priority. We have a designated team specialized in the treatment of Heart Failure. This Care Team includes your primary Heart Failure Specialized Cardiologist (physician), Advanced Practice Providers (APPs- Physician Assistants and Nurse Practitioners), and Pharmacist who all work together to provide you with the care you need, when you need it.   You may see any of the following providers on your designated Care Team at your next follow up:  Dr. Toribio Fuel Dr. Ezra Shuck Dr. Ria Commander Dr. Morene Brownie Ellouise Class, FNP Jaun Bash, RPH-CPP  Please be sure to bring in all your medications bottles to every appointment.   Need to Contact Us :  If you have any questions or concerns before your next appointment please send us  a message through [place] or call our office at [phone].    TO LEAVE A MESSAGE FOR THE NURSE SELECT OPTION 2, PLEASE LEAVE A MESSAGE INCLUDING: YOUR NAME DATE OF BIRTH CALL BACK NUMBER REASON FOR CALL**this is important as we prioritize the call backs  YOU WILL RECEIVE A CALL BACK THE SAME DAY AS LONG AS YOU CALL BEFORE 4:00 PM

## 2024-06-21 NOTE — Progress Notes (Unsigned)
 "   Advanced Heart Failure Clinic Note   PCP: Cyrus Selinda Moose, PA-C HF Cardiologist: Rolan Barrack, MD  HPI: Mary Velasquez is a 43 y.o. female with a history of type 2 diabetes, hysterectomy complicated by post-op anemia requiring blood transfusion, inappropriate sinus tachycardia, brain AVM, obesity, anemia, peripheral neuropathy, recurrent syncope and chronic systolic heart failure. Has paternal first cousin who died suddenly in her 30s from cardiac arrest.    Echo 5/24 showed EF 60-65% without RWMA, mild LVH, G1DD, normal RV systolic function and sive, small pericardial effusion present, with RA 3 mmHg.   Admitted 4/25 with dizziness and syncope. 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 CTA chest was negative for PE, but showed cardiomegaly, pulmonary edema, small bilateral pleural effusions, and dilated pulmonary artery. 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 Echo 4/25 showed LVEF 30-35%, mild LVH, GLS of -6.6%, RV function mildly reduced with moderately elevate PA systolic pressure, moderate MR, mild AR, moderate TR. EP consulted. L/RHC showed no significant CAD; CI 2.89, elevated filling pressures. CMRI showed LV EF 35%, mild RV dysfunction, no LGE.   Admitted 6/25 due to syncopal episode at home. She had another syncopal episode while in the ED. EKG showed ST with borderline prolonged QT interval. Given IVF, cardiology consulted. Syncope thought to be due  to over-diuresis. Was unable to tolerate previous jardiance  / losartan  because of hypotension. Found to have vitamin D / B12 deficiency. Zio monitor was supposed to be mailed to her home but she says that she never received.   Echo in 7/25 showed EF up to 50-55% with mild LVH, normal RV, normal IVC.   Was in the ED 10/25 and 11/25 again with syncopal/presyncopal episode. CMP notable for hyperglycemia without evidence of DKA. Urine without infection. Given IVF  and insulin .   Zio has been ordered twice, however it has never made it back to company. Daughter was supposed to have mailed it last month.  She returns today for HF follow up. Overall feeling well. Did have a syncopal event last month where she fell and broke her nose. NYHA II. Reports dypnea with strenuous activity, does have residual dizziness. Able to perform ADLs. Appetite okay. 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 Weight is up, had previously lost weight. Took PRN Lasix  yesterday, has not needed often. Reports compliance with all medications, however she has only filled her HF meds 2-3x this year.  Labs (6/25): K 3.5, creatinine 0.84  Labs (8/25): K 4.0, creatinine 1.58, GFR 41, BNP 27.4, A1c 10.4% Labs (9/25): K 4.5, creatinine 0.92, glucose 395 Labs (10/25): K 4.5, creatinine 1.05, glucose 410, Hg 10.9  ROS: All systems negative except what is listed in HPI, PMH and Problem List   Past Medical History:  Diagnosis Date   Anemia    AVM (arteriovenous malformation)    Diabetes mellitus without complication (HCC)    History of kidney stones    Peripheral neuropathy    Pneumonia    Tachycardia     Current Outpatient Medications  Medication Sig Dispense Refill   Blood Glucose Monitoring Suppl DEVI 1 each by Does not apply route 3 (three) times daily. May dispense any manufacturer covered by patient's insurance. 1 each 0   ferrous sulfate  325 (65 FE) MG EC tablet Take 1 tablet (325 mg total) by mouth 2 (two) times daily. 60 tablet 3   furosemide  (LASIX ) 40 MG tablet Take 1 tablet (40  mg total) by mouth as needed. 45 tablet 3   gabapentin  (NEURONTIN ) 300 MG capsule Take 600 mg by mouth at bedtime.     Glucose Blood (BLOOD GLUCOSE TEST STRIPS) STRP 1 each by Does not apply route 3 (three) times daily. Use as directed to check blood sugar. May dispense any manufacturer covered by patient's insurance and fits patient's device. 100 strip 0   insulin  aspart (NOVOLOG ) 100 UNIT/ML FlexPen  Inject 4 Units into the skin 3 (three) times daily with meals. Only take if eating a meal AND Blood Glucose (BG) is 80 or higher. 3 mL 2   insulin  glargine (LANTUS ) 100 UNIT/ML Solostar Pen Inject 14 Units into the skin at bedtime. May substitute as needed per insurance. 4.2 mL 2   Insulin  Pen Needle (PEN NEEDLES) 31G X 5 MM MISC 1 each by Does not apply route 3 (three) times daily. May dispense any manufacturer covered by patient's insurance. 100 each 0   JARDIANCE  10 MG TABS tablet Take 1 tablet (10 mg total) by mouth daily before breakfast. 30 tablet 11   Lancet Device MISC 1 each by Does not apply route 3 (three) times daily. May dispense any manufacturer covered by patient's insurance. 1 each 0   Lancets MISC 1 each by Does not apply route 3 (three) times daily. Use as directed to check blood sugar. May dispense any manufacturer covered by patient's insurance and fits patient's device. 100 each 0   metFORMIN  (GLUCOPHAGE ) 1000 MG tablet Take 1,000 mg by mouth 2 (two) times daily with a meal. Breakfast & supper     metoprolol  succinate (TOPROL -XL) 25 MG 24 hr tablet Take 3 tablets (75 mg total) by mouth daily. Take with or immediately following a meal. 270 tablet 3   nortriptyline  (PAMELOR ) 50 MG capsule Take 100 mg by mouth at bedtime.     spironolactone  (ALDACTONE ) 25 MG tablet Take 0.5 tablets (12.5 mg total) by mouth daily. (Patient not taking: Reported on 06/21/2024) 15 tablet 5   No current facility-administered medications for this visit.    Allergies  Allergen Reactions   Oxycodone Anaphylaxis and Swelling    Throat swelling   Oxycontin [Oxycodone Hcl] Anaphylaxis and Swelling    Throat swelling       Social History   Socioeconomic History   Marital status: Single    Spouse name: Not on file   Number of children: 2   Years of education: Not on file   Highest education level: Some college, no degree  Occupational History   Not on file  Tobacco Use   Smoking status: Never    Smokeless tobacco: Never  Vaping Use   Vaping status: Never Used  Substance and Sexual Activity   Alcohol use: No   Drug use: Not Currently   Sexual activity: Not on file  Other Topics Concern   Not on file  Social History Narrative   Lives alone   Social Drivers of Health   Tobacco Use: Low Risk  (05/19/2024)   Received from [facility]   Patient History    Smoking Tobacco Use: Never    Smokeless Tobacco Use: Never    Passive Exposure: Not on file  Financial Resource Strain: Low Risk  (05/19/2024)   Received from [facility]   Overall Financial Resource Strain (CARDIA)    Difficulty of Paying Living Expenses: Not hard at all  Food Insecurity: No Food Insecurity (05/19/2024)   Received from [facility]   Epic    Within the past 12 months, you worried that your food would run out before you got the money to buy more.: Never true    Within the past 12 months, the food you bought just didn't last and you didn't have money to get more.: Never true  Transportation Needs: No Transportation Needs (05/19/2024)   Received from [facility] - Transportation    In the past 12 months, has lack of transportation kept you from medical appointments or from getting medications?: No    Lack of Transportation (Non-Medical): No  Physical Activity: Not on file  Stress: Not on file  Social Connections: Not on file  Intimate Partner Violence: Not At Risk (12/07/2023)   Humiliation, Afraid, Rape, and Kick questionnaire    Fear of Current or Ex-Partner: No    Emotionally Abused: No    Physically Abused: No    Sexually Abused: No  Depression (PHQ2-9): Not on file  Alcohol Screen: Not on file  Housing: Low Risk  (05/19/2024)   Received from [facility]   Epic    In the last 12 months, was there a time when you were not able to pay the mortgage or rent on time?: No    In the past 12 months, how  many times have you moved where you were living?: 0    At any time in the past 12 months, were you homeless or living in a shelter (including now)?: No  Utilities: Not At Risk (05/19/2024)   Received from [facility]   Epic    In the past 12 months has the electric, gas, oil, or water  company threatened to shut off services in your home?: No  Health Literacy: Not on file      Family History  Problem Relation Age of Onset   Heart disease Paternal Aunt    Heart attack Maternal Grandfather    Heart disease Maternal Grandfather    Heart attack Cousin    Heart disease Cousin    CAD Neg Hx    Seizures Neg Hx     Vitals:   06/21/24 1346  BP: 112/74  Pulse: (!) 115  SpO2: 98%  Weight: 192 lb 6.4 oz (87.3 kg)    Wt Readings from Last 3 Encounters:  06/21/24 192 lb 6.4 oz (87.3 kg)  05/13/24 175 lb (79.4 kg)  04/29/24 187 lb (84.8 kg)   Lab Results  Component Value Date   CREATININE 1.19 (H) 05/13/2024   CREATININE 0.96 04/29/2024   CREATININE 1.05 (H) 04/15/2024   PHYSICAL EXAM: General: Well appearing. No distress  Cardiac: JVP flat. No murmurs  Extremities: Warm and dry.  No peripheral edema.  Neuro: A&O x3. Affect pleasant.   ASSESSMENT & PLAN:  1: Chronic HFimpEF: Nonischemic cardiomyopathy.  Echo in 4/25 with LVEF 30-35%, mild LVH, GLS of -6.6%, RV function mildly reduced with moderately elevate PA systolic pressure, moderate MR, mild AR, moderate TR.  LHC in 4/25 showed no significant CAD. CMRI in 4/25 showed LV EF 35%, mild RV dysfunction, no LGE. Echo 7/25 showed significant recovery of LV function with EF up to 50-55% with mild LVH, normal RV.   - genetics testing was indeterminate, reviewed and counseling offered, she deferred - NYHA class II. Mildly volume up on exam. Weight up. - continue lasix  40 mg daily; take a dose today; discussed PRN weight gain guidelines - GDMT has been limited by  syncope; poor compliance per dispense report - continue  Jardiance  10 mg daily.  BMET today - continue Toprol  XL to 75 mg daily for tachycardia - BMET/BNP today  2: Syncope: 4/25, 6/25, 10/25, and 11/25. Chronic mild sinus tachycardia. EF now recovered. She has been orthostatic in the past.  - Zio 17d 02/10/24, returned 02/26/24. Zio 14d 04/29/24, stated that her daughter was to mail in last month - arrhythmia still can not be ruled out without Zio results - will message PCP to consider work up for POTS - dicussed need for compression socks when working  3. Type 2 diabetes: Per PCP.  - A1c 4/25 was 10.4%   4. Obesity - Body mass index is 37.58 kg/m. - refer to PharmD for GLP1  5. Snoring - [facility] ordered 4/25, however no results available. [facility] reports that they have not received device.  Follow up in 3 months or sooner if needed.    Porche Steinberger, NP 06/21/2024  "

## 2024-06-22 ENCOUNTER — Other Ambulatory Visit (HOSPITAL_COMMUNITY): Payer: Self-pay

## 2024-06-22 LAB — BASIC METABOLIC PANEL WITH GFR
BUN/Creatinine Ratio: 15 (ref 9–23)
BUN: 21 mg/dL (ref 6–24)
CO2: 26 mmol/L (ref 20–29)
Calcium: 9.2 mg/dL (ref 8.7–10.2)
Chloride: 91 mmol/L — ABNORMAL LOW (ref 96–106)
Creatinine, Ser: 1.36 mg/dL — ABNORMAL HIGH (ref 0.57–1.00)
Glucose: 324 mg/dL — ABNORMAL HIGH (ref 70–99)
Potassium: 4.5 mmol/L (ref 3.5–5.2)
Sodium: 132 mmol/L — ABNORMAL LOW (ref 134–144)
eGFR: 50 mL/min/1.73 — ABNORMAL LOW

## 2024-06-22 LAB — BRAIN NATRIURETIC PEPTIDE: BNP: 20.4 pg/mL (ref 0.0–100.0)

## 2024-06-23 ENCOUNTER — Ambulatory Visit (HOSPITAL_COMMUNITY): Payer: Self-pay | Admitting: Cardiology

## 2024-06-28 ENCOUNTER — Telehealth: Payer: Self-pay | Admitting: Pharmacist

## 2024-06-28 NOTE — Telephone Encounter (Signed)
 Attempted x 2 to reach patient to inform her Mounjaro is covered for $0 and provide counseling before initiation of the medication. She is taking oral contraceptives and would need to add a barrier method or transition to alternative contraception to initiate Mounjaro.

## 2024-07-18 ENCOUNTER — Observation Stay
Admission: EM | Admit: 2024-07-18 | Discharge: 2024-07-19 | Disposition: A | Attending: Internal Medicine | Admitting: Internal Medicine

## 2024-07-18 ENCOUNTER — Emergency Department

## 2024-07-18 ENCOUNTER — Other Ambulatory Visit: Payer: Self-pay

## 2024-07-18 DIAGNOSIS — E1165 Type 2 diabetes mellitus with hyperglycemia: Secondary | ICD-10-CM

## 2024-07-18 DIAGNOSIS — N179 Acute kidney failure, unspecified: Secondary | ICD-10-CM | POA: Diagnosis not present

## 2024-07-18 DIAGNOSIS — R55 Syncope and collapse: Secondary | ICD-10-CM

## 2024-07-18 DIAGNOSIS — Z8673 Personal history of transient ischemic attack (TIA), and cerebral infarction without residual deficits: Secondary | ICD-10-CM | POA: Diagnosis not present

## 2024-07-18 DIAGNOSIS — Z6837 Body mass index (BMI) 37.0-37.9, adult: Secondary | ICD-10-CM | POA: Insufficient documentation

## 2024-07-18 DIAGNOSIS — I110 Hypertensive heart disease with heart failure: Secondary | ICD-10-CM | POA: Insufficient documentation

## 2024-07-18 DIAGNOSIS — R270 Ataxia, unspecified: Secondary | ICD-10-CM | POA: Diagnosis not present

## 2024-07-18 DIAGNOSIS — E871 Hypo-osmolality and hyponatremia: Secondary | ICD-10-CM | POA: Insufficient documentation

## 2024-07-18 DIAGNOSIS — Z794 Long term (current) use of insulin: Secondary | ICD-10-CM | POA: Diagnosis not present

## 2024-07-18 DIAGNOSIS — E1140 Type 2 diabetes mellitus with diabetic neuropathy, unspecified: Secondary | ICD-10-CM | POA: Diagnosis not present

## 2024-07-18 DIAGNOSIS — I5022 Chronic systolic (congestive) heart failure: Secondary | ICD-10-CM | POA: Diagnosis not present

## 2024-07-18 DIAGNOSIS — G894 Chronic pain syndrome: Secondary | ICD-10-CM | POA: Diagnosis not present

## 2024-07-18 DIAGNOSIS — E66812 Obesity, class 2: Secondary | ICD-10-CM | POA: Insufficient documentation

## 2024-07-18 DIAGNOSIS — R911 Solitary pulmonary nodule: Secondary | ICD-10-CM | POA: Diagnosis not present

## 2024-07-18 DIAGNOSIS — R260 Ataxic gait: Secondary | ICD-10-CM | POA: Diagnosis not present

## 2024-07-18 DIAGNOSIS — R079 Chest pain, unspecified: Secondary | ICD-10-CM | POA: Diagnosis not present

## 2024-07-18 LAB — CBC WITH DIFFERENTIAL/PLATELET
Abs Immature Granulocytes: 0.05 K/uL (ref 0.00–0.07)
Basophils Absolute: 0.1 K/uL (ref 0.0–0.1)
Basophils Relative: 1 %
Eosinophils Absolute: 0.2 K/uL (ref 0.0–0.5)
Eosinophils Relative: 3 %
HCT: 31.9 % — ABNORMAL LOW (ref 36.0–46.0)
Hemoglobin: 10.1 g/dL — ABNORMAL LOW (ref 12.0–15.0)
Immature Granulocytes: 1 %
Lymphocytes Relative: 22 %
Lymphs Abs: 2.1 K/uL (ref 0.7–4.0)
MCH: 26.8 pg (ref 26.0–34.0)
MCHC: 31.7 g/dL (ref 30.0–36.0)
MCV: 84.6 fL (ref 80.0–100.0)
Monocytes Absolute: 0.9 K/uL (ref 0.1–1.0)
Monocytes Relative: 10 %
Neutro Abs: 6 K/uL (ref 1.7–7.7)
Neutrophils Relative %: 63 %
Platelets: 307 K/uL (ref 150–400)
RBC: 3.77 MIL/uL — ABNORMAL LOW (ref 3.87–5.11)
RDW: 13.1 % (ref 11.5–15.5)
WBC: 9.2 K/uL (ref 4.0–10.5)
nRBC: 0 % (ref 0.0–0.2)

## 2024-07-18 LAB — COMPREHENSIVE METABOLIC PANEL WITH GFR
ALT: 10 U/L (ref 0–44)
AST: 26 U/L (ref 15–41)
Albumin: 3.5 g/dL (ref 3.5–5.0)
Alkaline Phosphatase: 149 U/L — ABNORMAL HIGH (ref 38–126)
Anion gap: 9 (ref 5–15)
BUN: 24 mg/dL — ABNORMAL HIGH (ref 6–20)
CO2: 28 mmol/L (ref 22–32)
Calcium: 9.5 mg/dL (ref 8.9–10.3)
Chloride: 96 mmol/L — ABNORMAL LOW (ref 98–111)
Creatinine, Ser: 1.28 mg/dL — ABNORMAL HIGH (ref 0.44–1.00)
GFR, Estimated: 53 mL/min — ABNORMAL LOW
Glucose, Bld: 79 mg/dL (ref 70–99)
Potassium: 4.7 mmol/L (ref 3.5–5.1)
Sodium: 133 mmol/L — ABNORMAL LOW (ref 135–145)
Total Bilirubin: 0.3 mg/dL (ref 0.0–1.2)
Total Protein: 8.1 g/dL (ref 6.5–8.1)

## 2024-07-18 LAB — TROPONIN T, HIGH SENSITIVITY
Troponin T High Sensitivity: 18 ng/L (ref 0–19)
Troponin T High Sensitivity: <15 ng/L (ref 0–19)

## 2024-07-18 LAB — MAGNESIUM: Magnesium: 1.9 mg/dL (ref 1.7–2.4)

## 2024-07-18 MED ORDER — LACTATED RINGERS IV BOLUS
1000.0000 mL | Freq: Once | INTRAVENOUS | Status: AC
  Administered 2024-07-18: 1000 mL via INTRAVENOUS

## 2024-07-18 MED ORDER — IOHEXOL 350 MG/ML SOLN
75.0000 mL | Freq: Once | INTRAVENOUS | Status: AC | PRN
  Administered 2024-07-18: 75 mL via INTRAVENOUS

## 2024-07-18 MED ORDER — GADOBUTROL 1 MMOL/ML IV SOLN
9.0000 mL | Freq: Once | INTRAVENOUS | Status: AC | PRN
  Administered 2024-07-18: 9 mL via INTRAVENOUS

## 2024-07-18 NOTE — H&P (Incomplete)
 " History and Physical    Patient: Mary Velasquez FMW:[id number] DOB: 18-Jan-1981 DOA: 07/18/2024 DOS: the patient was seen and examined on 07/18/2024 PCP: Cyrus Selinda Moose, PA-C  Patient coming from: Home  Chief Complaint:  Chief Complaint  Patient presents with   Chest Pain   HPI: Mary Velasquez is a 44 y.o. female with medical history significant of T2DM, Brain AVM, recurrent episodes of syncope, NICM with HFrecEF (EF 50-55% -07/25) who presents to the emergency department from work via EMS due to midsternal chest pain which started about 1 hour prior to arrival. Patient endorsed being in her normal state of health prior to going to bed yesterday.  On waking up this morning, she noted some dizziness and being unstable on her feet.   Chest pain was pressure-like and radiates to her right arm and this worsens with raising of her right arm.  She denies headache, vision changes, hitting her head.  EMS was activated, aspirin  324 mg and nitroglycerin were given with improvement in chest pain.  ED course In the emergency department, she was tachycardic, but other vital signs are within normal range.  Orthostatic vital signs after standing for 3 minutes was negative.  Workup in the ED showed normocytic anemia.  BMP was significant for sodium of 133, chloride 96, BUN 24, creatinine 1.28 (baseline creatinine 0.9-1.0).  Troponin x 2 was normal. CT of head and MRI of brain without contrast showed no acute intracranial abnormality except chronic bilateral cerebellar infarcts seen on MRI brain. CT angio PE showed no evidence of pulmonary embolism, but showed pulmonary nodule EKG personally reviewed showed sinus tachycardia at a rate of 110 bpm IV hydration was provided.  TRH was asked to admit patient.    Review of Systems: As mentioned in the history of present illness. All other systems reviewed and are negative. Past Medical History:  Diagnosis Date   Anemia    AVM (arteriovenous malformation)     Diabetes mellitus without complication (HCC)    History of kidney stones    Peripheral neuropathy    Pneumonia    Tachycardia    Past Surgical History:  Procedure Laterality Date   ABDOMINAL SURGERY     BREAST SURGERY     CESAREAN SECTION     x 2   CYSTOSCOPY N/A 08/21/2023   Procedure: CYSTOSCOPY;  Surgeon: Schermerhorn, Debby PARAS, MD;  Location: [facility] ORS;  Service: Gynecology;  Laterality: N/A;   ESOPHAGOGASTRODUODENOSCOPY N/A 07/10/2018   Procedure: ESOPHAGOGASTRODUODENOSCOPY (EGD);  Surgeon: Unk Corinn Skiff, MD;  Location: [facility] ENDOSCOPY;  Service: Gastroenterology;  Laterality: N/A;   ESOPHAGOGASTRODUODENOSCOPY     HYSTERECTOMY ABDOMINAL WITH SALPINGECTOMY Bilateral 08/21/2023   Procedure: HYSTERECTOMY ABDOMINAL WITH SALPINGECTOMY;  Surgeon: Schermerhorn, Debby PARAS, MD;  Location: [facility] ORS;  Service: Gynecology;  Laterality: Bilateral;   RIGHT/LEFT HEART CATH AND CORONARY ANGIOGRAPHY N/A 10/09/2023   Procedure: RIGHT/LEFT HEART CATH AND CORONARY ANGIOGRAPHY;  Surgeon: Anner Alm ORN, MD;  Location: [facility] INVASIVE CV LAB;  Service: Cardiovascular;  Laterality: N/A;   TUBAL LIGATION     Social History:  reports that she has never smoked. She has never used smokeless tobacco. She reports that she does not currently use drugs. She reports that she does not drink alcohol.  Allergies[1]  Family History  Problem Relation Age of Onset   Heart disease Paternal Aunt    Heart attack Maternal Grandfather    Heart disease Maternal Grandfather    Heart attack Cousin    Heart disease  Cousin    CAD Neg Hx    Seizures Neg Hx     Prior to Admission medications  Medication Sig Start Date End Date Taking? Authorizing Provider  Blood Glucose Monitoring Suppl DEVI 1 each by Does not apply route 3 (three) times daily. May dispense any manufacturer covered by patient's insurance. 01/16/23   Awanda City, MD  ferrous sulfate  325 (65 FE) MG EC tablet Take 1 tablet (325 mg total) by mouth 2 (two)  times daily. 08/24/23 08/23/24  Schermerhorn, Debby PARAS, MD  furosemide  (LASIX ) 40 MG tablet Take 1 tablet (40 mg total) by mouth as needed. 04/29/24   Donette City LABOR, FNP  gabapentin  (NEURONTIN ) 300 MG capsule Take 600 mg by mouth at bedtime. 08/13/21   [provider]  Glucose Blood (BLOOD GLUCOSE TEST STRIPS) STRP 1 each by Does not apply route 3 (three) times daily. Use as directed to check blood sugar. May dispense any manufacturer covered by patient's insurance and fits patient's device. 01/16/23   Awanda City, MD  insulin  aspart (NOVOLOG ) 100 UNIT/ML FlexPen Inject 4 Units into the skin 3 (three) times daily with meals. Only take if eating a meal AND Blood Glucose (BG) is 80 or higher. 01/16/23   Awanda City, MD  insulin  glargine (LANTUS ) 100 UNIT/ML Solostar Pen Inject 14 Units into the skin at bedtime. May substitute as needed per insurance. 01/16/23   Awanda City, MD  Insulin  Pen Needle (PEN NEEDLES) 31G X 5 MM MISC 1 each by Does not apply route 3 (three) times daily. May dispense any manufacturer covered by patient's insurance. 01/16/23   Awanda City, MD  JARDIANCE  10 MG TABS tablet Take 1 tablet (10 mg total) by mouth daily before breakfast. 04/29/24   Donette City LABOR, FNP  Lancet Device MISC 1 each by Does not apply route 3 (three) times daily. May dispense any manufacturer covered by patient's insurance. 01/16/23   Awanda City, MD  Lancets MISC 1 each by Does not apply route 3 (three) times daily. Use as directed to check blood sugar. May dispense any manufacturer covered by patient's insurance and fits patient's device. 01/16/23   Awanda City, MD  metFORMIN  (GLUCOPHAGE ) 1000 MG tablet Take 1,000 mg by mouth 2 (two) times daily with a meal. Breakfast & supper    [provider]  metoprolol  succinate (TOPROL -XL) 25 MG 24 hr tablet Take 3 tablets (75 mg total) by mouth daily. Take with or immediately following a meal. 04/29/24 04/24/25  Donette City LABOR, FNP  nortriptyline  (PAMELOR ) 50 MG  capsule Take 100 mg by mouth at bedtime. 08/13/21   [provider]    Physical Exam: Vitals:   07/18/24 1500 07/18/24 1530 07/18/24 1730 07/18/24 2021  BP: 122/83 123/87 (!) 154/98 (!) 154/124  Pulse: (!) 110 (!) 109 98 (!) 108  Resp: 18 15 12 16   Temp:    98.3 F (36.8 C)  TempSrc:    Oral  SpO2: 94% 94% 97% 99%  Weight:      Height:       General: Awake and alert and oriented x3. Not in any acute distress.  HEENT: NCAT.  PERRLA. EOMI. Sclerae anicteric.  Moist mucosal membranes. Neck: Neck supple without lymphadenopathy. No carotid bruits. No masses palpated.  Cardiovascular: Regular rate with normal S1-S2 sounds. No murmurs, rubs or gallops auscultated. No JVD.  Respiratory: Clear breath sounds.  No accessory muscle use. Abdomen: Soft, nontender, nondistended. Active bowel sounds. No masses or hepatosplenomegaly  Skin: No  rashes, lesions, or ulcerations.  Dry, warm to touch. Musculoskeletal:  2+ dorsalis pedis and radial pulses. Good ROM.  No contractures  Psychiatric: Intact judgment and insight.  Mood appropriate to current condition. Neurologic: No focal neurological deficits. Strength is 5/5 x 4.  CN II - XII grossly intact.  Assessment and Plan: Chest pain rule out ACS  Ataxia Acute kidney injury Hyponatremia Right lower lobe nodule  Obesity class II (BMI 37.32) Diet and lifestyle modification      Advance Care Planning:   Code Status: Prior ***  Consults: ***  Family Communication: ***  Severity of Illness: {Observation/Inpatient:21159}  Author: Posey Maier, DO 07/18/2024 11:06 PM  For on call review [email or web address].     [1]  Allergies Allergen Reactions   Oxycodone Anaphylaxis and Swelling    Throat swelling   Oxycontin [Oxycodone Hcl] Anaphylaxis and Swelling    Throat swelling    "

## 2024-07-18 NOTE — ED Triage Notes (Signed)
 Pt brought in from work by EMS that started about 1 hour ago. Pt states CP is a throbbing pain in the center of chest; radiates to her right arm; pain increases when she lifts her right arm. Pt initially rates pain 7/10; 324mg  ASA administered PTA by EMS. Pain decreased to 6/10.

## 2024-07-18 NOTE — ED Provider Notes (Signed)
 "  [facility] Provider Note    Event Date/Time   First MD Initiated Contact with Patient 07/18/24 1500     (approximate)   History   Chest Pain   HPI  Mary Velasquez is a 44 y.o. female past medical history significant for nonischemic cardiomyopathy with recovered EF but history of CHF, recurrent episodes of syncope, diabetes, presents to the emergency department with chest pain to the center of her chest.  Patient states that she whenever she got up this morning she was feeling unstable on her feet with some dizziness.  States that she was in her normal state of health yesterday whenever she went to bed.  About 1 hour ago had an episode of some chest pressure sensation.  No significant shortness of breath.  States that she felt like she was going to pass out at work.  Denies hitting her head.  Denies significant headache or change in vision.  Denies slurring of speech or trouble swallowing.  Denies focal numbness or weakness of extremity.  Denies any tearing chest pain.  States that she had significant improvement with slight nitroglycerin given with EMS and that her pain is only a dull pain at this time.  No history of DVT or PE.  No recent surgery.  No cough or congestion.  No fever or chills.  No prior history of CVA.  EMS administered 324 mg of aspirin  which improved her pain     Physical Exam   Triage Vital Signs: ED Triage Vitals  Encounter Vitals Group     BP 07/18/24 1422 114/68     Girls Systolic BP Percentile --      Girls Diastolic BP Percentile --      Boys Systolic BP Percentile --      Boys Diastolic BP Percentile --      Pulse Rate 07/18/24 1422 (!) 111     Resp 07/18/24 1422 17     Temp 07/18/24 1422 98 F (36.7 C)     Temp Source 07/18/24 1422 Oral     SpO2 07/18/24 1422 94 %     Weight 07/18/24 1421 191 lb 1.6 oz (86.7 kg)     Height 07/18/24 1421 5' (1.524 m)     Head Circumference --      Peak Flow --      Pain Score 07/18/24 1421  6     Pain Loc --      Pain Education --      Exclude from Growth Chart --     Most recent vital signs: Vitals:   07/18/24 1730 07/18/24 2021  BP: (!) 154/98 (!) 154/124  Pulse: 98 (!) 108  Resp: 12 16  Temp:  98.3 F (36.8 C)  SpO2: 97% 99%    Physical Exam Constitutional:      Appearance: She is well-developed.  HENT:     Head: Atraumatic.  Eyes:     Conjunctiva/sclera: Conjunctivae normal.  Cardiovascular:     Rate and Rhythm: Regular rhythm.     Pulses:          Radial pulses are 2+ on the right side and 2+ on the left side.       Dorsalis pedis pulses are 2+ on the right side and 2+ on the left side.     Heart sounds: Normal heart sounds.  Pulmonary:     Effort: No respiratory distress.     Breath sounds: No wheezing.  Abdominal:  General: There is no distension.     Palpations: Abdomen is soft.     Tenderness: There is no abdominal tenderness.  Musculoskeletal:        General: Normal range of motion.     Cervical back: Normal range of motion.     Right lower leg: No edema.     Left lower leg: No edema.  Skin:    General: Skin is warm.     Capillary Refill: Capillary refill takes less than 2 seconds.  Neurological:     Mental Status: She is alert. Mental status is at baseline.     GCS: GCS eye subscore is 4. GCS verbal subscore is 5. GCS motor subscore is 6.     Cranial Nerves: Cranial nerves 2-12 are intact.     Sensory: Sensation is intact.     Motor: Motor function is intact.     Coordination: Coordination is intact.     Gait: Gait abnormal.     IMPRESSION / MDM / ASSESSMENT AND PLAN / ED COURSE  I reviewed the triage vital signs and the nursing notes.  Differential diagnosis including pulmonary embolism, ACS, anemia, dehydration, CVA, intracranial hemorrhage  Patient is outside of the window for TNK.  Van negative.  Last known well was yesterday.  Plan for CT scan of the head, moderate risk will start cereus will obtain a CTA pulmonary  embolism study for further evaluation of PE  Patient started on a 500 bolus.  Received aspirin  and nitroglycerin with EMS and currently only with mild chest discomfort.   EKG  I, Clotilda Punter, the attending physician, personally viewed and interpreted this ECG.  EKG showed sinus tachycardia with a heart rate of 110.  Narrow complex.  Normal intervals.  Questionable findings of LVH.  No significant ST elevation or depression.  No signs of acute ischemia or dysrhythmia.  No significant change when compared to prior EKG on 13 May 2024  Sinus tachycardia while on cardiac telemetry.  RADIOLOGY I independently reviewed imaging, my interpretation of imaging: Cardiomegaly with no signs of pulmonary edema.  No signs of focal pneumonia or pneumothorax.  CT scan of the head -no acute findings  CTA PE -no pulmonary embolism.  Pulmonary nodule/ground glass opacity.  Recommended repeat noncontrasted CT scan in 6 to 12 months.  Discussed incidental finding.  Ordered referral to pulmonary nodule clinic.  MRI brain with and without with no acute infarct.  Noted bilateral prior cerebral infarcts.  LABS (all labs ordered are listed, but only abnormal results are displayed) Labs interpreted as -    Labs Reviewed  CBC WITH DIFFERENTIAL/PLATELET - Abnormal; Notable for the following components:      Result Value   RBC 3.77 (*)    Hemoglobin 10.1 (*)    HCT 31.9 (*)    All other components within normal limits  COMPREHENSIVE METABOLIC PANEL WITH GFR - Abnormal; Notable for the following components:   Sodium 133 (*)    Chloride 96 (*)    BUN 24 (*)    Creatinine, Ser 1.28 (*)    Alkaline Phosphatase 149 (*)    GFR, Estimated 53 (*)    All other components within normal limits  MAGNESIUM   URINALYSIS, W/ REFLEX TO CULTURE (INFECTION SUSPECTED)  TROPONIN T, HIGH SENSITIVITY  TROPONIN T, HIGH SENSITIVITY     MDM  Lab work notable for no leukocytosis, anemia but hemoglobin appears to be  close to baseline at 10.1 today with a baseline ranging from  10.9-11.  Normal platelets.  Serial troponins are undetectable, no chest pain at this time, have low suspicion for ACS   Clinical Course as of 07/18/24 2340  Sun Jul 18, 2024  1821 On reevaluation no longer tachycardic.  No significant chest pain at this time but states that she still feels woozy.  Will obtain orthostatic blood pressures.  Attempted to ambulate the patient in appeared to have abnormal gait.  Will order an MRI to further evaluate for CVA. [SM]    Clinical Course User Index [SM] Suzanne Kirsch, MD   Repeat exam no again tachycardic.  Patient was ordered 1.5 L of IV fluids.  Continues to be tachycardic.  Negative orthostatic blood pressures.  Again reattempted to ambulate the patient and has ongoing significant ataxic gait.  No nystagmus.  Not a TNK candidate and LVO negative.  No neck manipulation and have low suspicion for dissection.  Serial troponins were negative.  Mild anemia.  Creatinine appears to be at her baseline.  Tolerating p.o.  No signs of pulmonary embolism.  No signs of pneumonia on CT scan.  On reevaluation continues to have ataxic gait.  Consulted hospitalist for admission for acute ataxia in the setting of prior bilateral cerebral infarcts.  Message Dr. Matthews with neurology to discuss possible evaluation tomorrow.  Chest pain-free at this time.  PROCEDURES:  Critical Care performed: No  Procedures  Patient's presentation is most consistent with acute presentation with potential threat to life or bodily function.   MEDICATIONS ORDERED IN ED: Medications  iohexol  (OMNIPAQUE ) 350 MG/ML injection 75 mL (75 mLs Intravenous Contrast Given 07/18/24 1603)  lactated ringers  bolus 1,000 mL (1,000 mLs Intravenous New Bag/Given 07/18/24 2057)  gadobutrol  (GADAVIST ) 1 MMOL/ML injection 9 mL (9 mLs Intravenous Contrast Given 07/18/24 1950)    FINAL CLINICAL IMPRESSION(S) / ED DIAGNOSES   Final  diagnoses:  Pulmonary nodule  Ataxia  Chest pain, unspecified type     Rx / DC Orders   ED Discharge Orders          Ordered    AMB  Referral to Pulmonary Nodule Clinic        07/18/24 1822             Note:  This document was prepared using Dragon voice recognition software and may include unintentional dictation errors.   Suzanne Kirsch, MD 07/18/24 2340  "

## 2024-07-19 ENCOUNTER — Other Ambulatory Visit: Payer: Self-pay

## 2024-07-19 ENCOUNTER — Observation Stay: Admit: 2024-07-19 | Discharge: 2024-07-19 | Disposition: A | Attending: Physician Assistant

## 2024-07-19 ENCOUNTER — Observation Stay

## 2024-07-19 ENCOUNTER — Observation Stay: Admit: 2024-07-19 | Discharge: 2024-07-19 | Disposition: A | Attending: Internal Medicine

## 2024-07-19 DIAGNOSIS — E1165 Type 2 diabetes mellitus with hyperglycemia: Secondary | ICD-10-CM

## 2024-07-19 DIAGNOSIS — R55 Syncope and collapse: Secondary | ICD-10-CM

## 2024-07-19 DIAGNOSIS — R000 Tachycardia, unspecified: Secondary | ICD-10-CM

## 2024-07-19 DIAGNOSIS — R079 Chest pain, unspecified: Secondary | ICD-10-CM | POA: Diagnosis not present

## 2024-07-19 DIAGNOSIS — N179 Acute kidney failure, unspecified: Secondary | ICD-10-CM

## 2024-07-19 DIAGNOSIS — R42 Dizziness and giddiness: Secondary | ICD-10-CM | POA: Diagnosis not present

## 2024-07-19 DIAGNOSIS — I5030 Unspecified diastolic (congestive) heart failure: Secondary | ICD-10-CM

## 2024-07-19 LAB — ECHOCARDIOGRAM COMPLETE
AR max vel: 2.76 cm2
AV Area VTI: 2.79 cm2
AV Area mean vel: 2.42 cm2
AV Mean grad: 3 mmHg
AV Peak grad: 5.2 mmHg
Ao pk vel: 1.14 m/s
Area-P 1/2: 12.64 cm2
Height: 60 in
MV VTI: 3.55 cm2
S' Lateral: 2.1 cm
Weight: 3057.6 [oz_av]

## 2024-07-19 LAB — CBC
HCT: 30 % — ABNORMAL LOW (ref 36.0–46.0)
Hemoglobin: 9.8 g/dL — ABNORMAL LOW (ref 12.0–15.0)
MCH: 27.3 pg (ref 26.0–34.0)
MCHC: 32.7 g/dL (ref 30.0–36.0)
MCV: 83.6 fL (ref 80.0–100.0)
Platelets: 282 K/uL (ref 150–400)
RBC: 3.59 MIL/uL — ABNORMAL LOW (ref 3.87–5.11)
RDW: 13.1 % (ref 11.5–15.5)
WBC: 8.1 K/uL (ref 4.0–10.5)
nRBC: 0 % (ref 0.0–0.2)

## 2024-07-19 LAB — COMPREHENSIVE METABOLIC PANEL WITH GFR
ALT: 7 U/L (ref 0–44)
AST: 14 U/L — ABNORMAL LOW (ref 15–41)
Albumin: 3.1 g/dL — ABNORMAL LOW (ref 3.5–5.0)
Alkaline Phosphatase: 130 U/L — ABNORMAL HIGH (ref 38–126)
Anion gap: 10 (ref 5–15)
BUN: 19 mg/dL (ref 6–20)
CO2: 26 mmol/L (ref 22–32)
Calcium: 8.7 mg/dL — ABNORMAL LOW (ref 8.9–10.3)
Chloride: 97 mmol/L — ABNORMAL LOW (ref 98–111)
Creatinine, Ser: 0.84 mg/dL (ref 0.44–1.00)
GFR, Estimated: >60 mL/min
Glucose, Bld: 313 mg/dL — ABNORMAL HIGH (ref 70–99)
Potassium: 4.2 mmol/L (ref 3.5–5.1)
Sodium: 133 mmol/L — ABNORMAL LOW (ref 135–145)
Total Bilirubin: <0.2 mg/dL (ref 0.0–1.2)
Total Protein: 6.9 g/dL (ref 6.5–8.1)

## 2024-07-19 LAB — PHOSPHORUS: Phosphorus: 2.9 mg/dL (ref 2.5–4.6)

## 2024-07-19 LAB — CBG MONITORING, ED
Glucose-Capillary: 251 mg/dL — ABNORMAL HIGH (ref 70–99)
Glucose-Capillary: 166 mg/dL — ABNORMAL HIGH (ref 70–99)
Glucose-Capillary: 312 mg/dL — ABNORMAL HIGH (ref 70–99)

## 2024-07-19 LAB — HEMOGLOBIN A1C
Hgb A1c MFr Bld: 14.4 % — ABNORMAL HIGH (ref 4.8–5.6)
Mean Plasma Glucose: 366.58 mg/dL

## 2024-07-19 LAB — HIV ANTIBODY (ROUTINE TESTING W REFLEX): HIV Screen 4th Generation wRfx: NONREACTIVE

## 2024-07-19 LAB — MAGNESIUM: Magnesium: 1.9 mg/dL (ref 1.7–2.4)

## 2024-07-19 MED ORDER — INSULIN ASPART 100 UNIT/ML IJ SOLN: 0.0000 [IU] | Freq: Every day | INTRAMUSCULAR | Status: DC

## 2024-07-19 MED ORDER — ONDANSETRON HCL 4 MG PO TABS: 4.0000 mg | ORAL_TABLET | Freq: Four times a day (QID) | ORAL | Status: DC | PRN

## 2024-07-19 MED ORDER — FERROUS SULFATE 325 (65 FE) MG PO TABS
325.0000 mg | ORAL_TABLET | Freq: Two times a day (BID) | ORAL | Status: DC
  Administered 2024-07-19: 325 mg via ORAL
  Filled 2024-07-19: qty 1

## 2024-07-19 MED ORDER — METOPROLOL SUCCINATE ER 50 MG PO TB24
75.0000 mg | ORAL_TABLET | Freq: Every day | ORAL | Status: DC
  Administered 2024-07-19: 75 mg via ORAL
  Filled 2024-07-19: qty 1

## 2024-07-19 MED ORDER — ACETAMINOPHEN 325 MG PO TABS: 650.0000 mg | ORAL_TABLET | Freq: Four times a day (QID) | ORAL | Status: DC | PRN

## 2024-07-19 MED ORDER — ENOXAPARIN SODIUM 40 MG/0.4ML IJ SOSY
40.0000 mg | PREFILLED_SYRINGE | INTRAMUSCULAR | Status: DC
  Administered 2024-07-19: 40 mg via SUBCUTANEOUS
  Filled 2024-07-19: qty 0.4

## 2024-07-19 MED ORDER — INSULIN ASPART 100 UNIT/ML IJ SOLN
0.0000 [IU] | Freq: Three times a day (TID) | INTRAMUSCULAR | Status: DC
  Administered 2024-07-19: 3 [IU] via SUBCUTANEOUS
  Administered 2024-07-19: 11 [IU] via SUBCUTANEOUS
  Filled 2024-07-19: qty 11
  Filled 2024-07-19: qty 3

## 2024-07-19 MED ORDER — ACETAMINOPHEN 650 MG RE SUPP: 650.0000 mg | Freq: Four times a day (QID) | RECTAL | Status: DC | PRN

## 2024-07-19 MED ORDER — INSULIN ASPART 100 UNIT/ML IJ SOLN
0.0000 [IU] | INTRAMUSCULAR | Status: DC
  Administered 2024-07-19: 8 [IU] via SUBCUTANEOUS
  Filled 2024-07-19: qty 8

## 2024-07-19 MED ORDER — GABAPENTIN 300 MG PO CAPS: 600.0000 mg | ORAL_CAPSULE | Freq: Every day | ORAL | Status: DC

## 2024-07-19 MED ORDER — ENOXAPARIN SODIUM 40 MG/0.4ML IJ SOSY: 40.0000 mg | PREFILLED_SYRINGE | INTRAMUSCULAR | Status: DC

## 2024-07-19 MED ORDER — ONDANSETRON HCL 4 MG/2ML IJ SOLN: 4.0000 mg | Freq: Four times a day (QID) | INTRAMUSCULAR | Status: DC | PRN

## 2024-07-19 MED ORDER — EMPAGLIFLOZIN 10 MG PO TABS
10.0000 mg | ORAL_TABLET | Freq: Every day | ORAL | Status: DC
  Administered 2024-07-19: 10 mg via ORAL
  Filled 2024-07-19 (×2): qty 1

## 2024-07-19 MED ORDER — FUROSEMIDE 40 MG PO TABS: 40.0000 mg | ORAL_TABLET | ORAL | Status: DC | PRN

## 2024-07-19 NOTE — Discharge Summary (Signed)
 " Physician Discharge Summary   Patient: Mary Velasquez MRN: [id number] DOB: Nov 17, 1980  Admit date:     07/18/2024  Discharge date: 07/19/24  Discharge Physician: Leita Blanch   PCP: Cyrus Selinda Moose, PA-C   Recommendations at discharge:   follow-up [facility] cardiology Dr. Darron. Patient will have Zio monitor placed for evaluation of recurrent syncope keep tab of your sugars at home review with your PCP and in endocrinologist follow-up PCP in 1 to 2 weeks  Discharge Diagnoses: Principal Problem:   Chest pain  Mary Velasquez is a 44 y.o. 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 Patient endorsed being in her normal state of health prior to going to bed yesterday.  On waking up this morning, she noted some dizziness and worsening instability to her feet (chronically feels unstable to her feet but able to ambulate without any issues).  She went to work this morning and while at work, she states that she passed out About 1 hour after recovering from syncope, she developed  Chest pain which was pressure-like and worsens with raising of her right arm without radiation.  She denies headache, vision changes, hitting her head.  EMS was activated, aspirin  324 mg and nitroglycerin were given with improvement in chest pain.   CT of head and MRI of brain without contrast showed no acute intracranial abnormality except chronic bilateral cerebellar infarcts seen on MRI brain. CT angio PE showed no evidence of pulmonary embolism, but showed pulmonary nodule EKG personally reviewed showed sinus tachycardia at a rate of 110 bpm  Syncope/history of recurrent syncope -- patient remained in sinus/sinus tach -- seen by [facility] cardiology Dr. Darron.Zio monitor to be placed prior to discharge. She  may require loop monitor down the road. Patient will follow-up with them as -- hemodynamically otherwise stable. -- Echo no new changes. Okay from cardiology for discharge -- continue home meds -- carotid Doppler negative -- patient had cardiac cath in April 2025 which was normal  History of CVA -- seen by neurology. -- MRI no new stroke. Carotid Doppler no significant stenosis -- continue aspirin  and statins per neurology -- blood sugar management  Uncontrolled type II diabetes/dietary noncompliance -- A1c 14.5% -- resume home dose of insulin  -- sliding scale insulin  -- patient to follow-up with [facility] endocrinologist on her appointment -- exercise and lifestyle discussed  Acute kidney injury -- creatinine 1.28--- IVF .8 -- hemodynamically stable  Obesity type II -- life style and that discussed  Patient overall remained hemodialysis table in the ER. Discussed discharge planning. She is agreeable. She'll follow-up with PCP and cardiology       Consultants: neurology, cardiology Procedures performed: none Disposition: home Diet recommendation:  Cardiac and Carb modified diet DISCHARGE MEDICATION: Allergies as of 07/19/2024       Reactions   Oxycodone Anaphylaxis, Swelling   Throat swelling   Oxycontin [oxycodone Hcl] Anaphylaxis, Swelling   Throat swelling        Medication List     TAKE these medications    Blood Glucose Monitoring Suppl Devi 1 each by Does not apply route 3 (three) times daily. May dispense any manufacturer covered by patient's insurance.   BLOOD GLUCOSE TEST STRIPS Strp 1 each by Does not apply route 3 (three) times  daily. Use as directed to check blood sugar. May dispense any manufacturer covered by patient's insurance and fits patient's device.   ferrous sulfate  325 (65 FE) MG EC tablet Take 1 tablet (325 mg total) by mouth 2 (two) times daily.   furosemide  40 MG tablet Commonly known as: LASIX  Take 1 tablet (40 mg total)  by mouth as needed.   gabapentin  300 MG capsule Commonly known as: NEURONTIN  Take 600 mg by mouth at bedtime.   insulin  aspart 100 UNIT/ML FlexPen Commonly known as: NOVOLOG  Inject 4 Units into the skin 3 (three) times daily with meals. Only take if eating a meal AND Blood Glucose (BG) is 80 or higher. What changed: how much to take   insulin  glargine 100 UNIT/ML Solostar Pen Commonly known as: LANTUS  Inject 14 Units into the skin at bedtime. May substitute as needed per insurance. What changed: how much to take   Jardiance  10 MG Tabs tablet Generic drug: empagliflozin  Take 1 tablet (10 mg total) by mouth daily before breakfast.   Lancet Device Misc 1 each by Does not apply route 3 (three) times daily. May dispense any manufacturer covered by patient's insurance.   Lancets Misc 1 each by Does not apply route 3 (three) times daily. Use as directed to check blood sugar. May dispense any manufacturer covered by patient's insurance and fits patient's device.   metFORMIN  1000 MG tablet Commonly known as: GLUCOPHAGE  Take 1,000 mg by mouth 2 (two) times daily with a meal. Breakfast & supper   metoprolol  succinate 25 MG 24 hr tablet Commonly known as: TOPROL -XL Take 3 tablets (75 mg total) by mouth daily. Take with or immediately following a meal.   nortriptyline  50 MG capsule Commonly known as: PAMELOR  Take 100 mg by mouth at bedtime.   Pen Needles 31G X 5 MM Misc 1 each by Does not apply route 3 (three) times daily. May dispense any manufacturer covered by patient's insurance.        Follow-up Information     Cyrus, [employer], PA-C. Call in 1 day.   Specialty: Family Medicine Contact information: [address] [place] [address] [phone]         Darron Deatrice LABOR, MD. Schedule an appointment as soon as possible for a visit in 1 week(s).   Specialty: Cardiology Why: Recurrent syncope. Contact information: [address] [place] [address] [phone]                Discharge Exam: Fredricka Weights   07/18/24 1421  Weight: 86.7 kg   Morbid obesity alert and oriented times three respiratory clear to auscultation cardiovascular both heart sounds normal  Condition at discharge: fair  The results of significant diagnostics from this hospitalization (including imaging, microbiology, ancillary and laboratory) are listed below for reference.   Imaging Studies: ECHOCARDIOGRAM COMPLETE Result Date: 07/19/2024    ECHOCARDIOGRAM REPORT   Patient Name:   SAMAMTHA TIEGS Date of Exam: 07/19/2024 Medical Rec #:  [id number]      Height:       60.0 in Accession #:    [id number]     Weight:       191.1 lb Date of Birth:  1980/08/13      BSA:          1.831 m Patient Age:    43 years       BP:           165/98 mmHg Patient Gender: F  HR:           101 bpm. Exam Location:  [facility] Procedure: 2D Echo, Cardiac Doppler, Color Doppler, 3D Echo and Strain Analysis            (Both Spectral and Color Flow Doppler were utilized during            procedure). Indications:     Chest pain R07.9                  Syncope R55  History:         Patient has prior history of Echocardiogram examinations, most                  recent 01/14/2024. Risk Factors:Diabetes.  Sonographer:     Christopher Furnace Referring Phys:  [id number] OLADAPO ADEFESO Diagnosing Phys: Deatrice Cage MD  Sonographer Comments: Global longitudinal strain was attempted. IMPRESSIONS  1. Left ventricular ejection fraction, by estimation, is 55 to 60%. The left ventricle has normal function. The left ventricle has no regional wall motion abnormalities. There is moderate left ventricular hypertrophy. Left ventricular diastolic parameters are consistent with Grade I diastolic dysfunction (impaired relaxation).  2. Right ventricular systolic function is normal. The right ventricular size is normal. There is normal pulmonary artery systolic pressure.  3. A small pericardial  effusion is present. The pericardial effusion is posterior to the left ventricle. There is no evidence of cardiac tamponade.  4. The mitral valve is normal in structure. No evidence of mitral valve regurgitation. No evidence of mitral stenosis.  5. The aortic valve is normal in structure. Aortic valve regurgitation is trivial. No aortic stenosis is present.  6. The inferior vena cava is normal in size with greater than 50% respiratory variability, suggesting right atrial pressure of 3 mmHg. FINDINGS  Left Ventricle: Left ventricular ejection fraction, by estimation, is 55 to 60%. The left ventricle has normal function. The left ventricle has no regional wall motion abnormalities. Global longitudinal strain performed but not reported based on interpreter judgement due to suboptimal tracking. The left ventricular internal cavity size was normal in size. There is moderate left ventricular hypertrophy. Left ventricular diastolic parameters are consistent with Grade I diastolic dysfunction (impaired relaxation). Right Ventricle: The right ventricular size is normal. No increase in right ventricular wall thickness. Right ventricular systolic function is normal. There is normal pulmonary artery systolic pressure. The tricuspid regurgitant velocity is 1.49 m/s, and  with an assumed right atrial pressure of 3 mmHg, the estimated right ventricular systolic pressure is 11.9 mmHg. Left Atrium: Left atrial size was normal in size. Right Atrium: Right atrial size was normal in size. Pericardium: A small pericardial effusion is present. The pericardial effusion is posterior to the left ventricle. There is no evidence of cardiac tamponade. Mitral Valve: The mitral valve is normal in structure. No evidence of mitral valve regurgitation. No evidence of mitral valve stenosis. MV peak gradient, 6.2 mmHg. The mean mitral valve gradient is 3.0 mmHg. Tricuspid Valve: The tricuspid valve is normal in structure. Tricuspid valve  regurgitation is not demonstrated. No evidence of tricuspid stenosis. Aortic Valve: The aortic valve is normal in structure. Aortic valve regurgitation is trivial. No aortic stenosis is present. Aortic valve mean gradient measures 3.0 mmHg. Aortic valve peak gradient measures 5.2 mmHg. Aortic valve area, by VTI measures 2.79 cm. Pulmonic Valve: The pulmonic valve was normal in structure. Pulmonic valve regurgitation is trivial. No evidence of pulmonic stenosis. Aorta: The aortic root is normal in  size and structure. Venous: The inferior vena cava is normal in size with greater than 50% respiratory variability, suggesting right atrial pressure of 3 mmHg. IAS/Shunts: No atrial level shunt detected by color flow Doppler.  LEFT VENTRICLE PLAX 2D LVIDd:         3.50 cm   Diastology LVIDs:         2.10 cm   LV e' medial:    4.68 cm/s LV PW:         1.20 cm   LV E/e' medial:  14.6 LV IVS:        1.60 cm   LV e' lateral:   5.87 cm/s LVOT diam:     2.00 cm   LV E/e' lateral: 11.6 LV SV:         49 LV SV Index:   27 LVOT Area:     3.14 cm LV IVRT:       108 msec  RIGHT VENTRICLE RV Basal diam:  2.80 cm RV Mid diam:    2.60 cm RV S prime:     8.81 cm/s TAPSE (M-mode): 1.8 cm LEFT ATRIUM           Index        RIGHT ATRIUM          Index LA diam:      3.00 cm 1.64 cm/m   RA Area:     9.22 cm LA Vol (A2C): 21.2 ml 11.58 ml/m  RA Volume:   15.90 ml 8.68 ml/m LA Vol (A4C): 30.4 ml 16.60 ml/m  AORTIC VALVE AV Area (Vmax):    2.76 cm AV Area (Vmean):   2.42 cm AV Area (VTI):     2.79 cm AV Vmax:           113.50 cm/s AV Vmean:          77.650 cm/s AV VTI:            0.176 m AV Peak Grad:      5.2 mmHg AV Mean Grad:      3.0 mmHg LVOT Vmax:         99.80 cm/s LVOT Vmean:        59.700 cm/s LVOT VTI:          0.157 m LVOT/AV VTI ratio: 0.89  AORTA Ao Root diam: 2.60 cm MITRAL VALVE                TRICUSPID VALVE MV Area (PHT): 12.64 cm    TR Peak grad:   8.9 mmHg MV Area VTI:   3.55 cm     TR Vmax:        149.00 cm/s MV  Peak grad:  6.2 mmHg MV Mean grad:  3.0 mmHg     SHUNTS MV Vmax:       1.24 m/s     Systemic VTI:  0.16 m MV Vmean:      77.6 cm/s    Systemic Diam: 2.00 cm MV Decel Time: 60 msec MV E velocity: 68.10 cm/s MV A velocity: 108.00 cm/s MV E/A ratio:  0.63 Deatrice Cage MD Electronically signed by Deatrice Cage MD Signature Date/Time: 07/19/2024/12:56:10 PM    Final    US  Carotid Bilateral Result Date: 07/19/2024 EXAM: US  CAROTID DUPLEX 07/19/2024 07:49:44 AM TECHNIQUE: Real-time grayscale, color flow and spectral Doppler sonographic images were obtained of the extracranial carotid system using a linear transducer. COMPARISON: None available CLINICAL HISTORY: Syncope. FINDINGS: RIGHT: Common carotid artery: 55  cm/s Internal carotid artery: 81 cm/s External carotid artery: 97 cm/s Right ICA/CCA ratio: 1.0 Plaque: None Vertebral artery: Antegrade LEFT: Common carotid artery: 81 cm/s Internal carotid artery: 92 cm/s External carotid artery: 61 cm/s LEFT ICA/CCA ratio: 1.1 Plaque: None Vertebral artery: Antegrade IMPRESSION: 1. No hemodynamically significant stenosis (greater than 50%) within the extracranial internal carotid arteries. Electronically signed by: Evalene Coho MD 07/19/2024 08:09 AM EST RP Workstation: [id number]   MR Brain W and Wo Contrast Result Date: 07/18/2024 EXAM: MRI BRAIN WITH AND WITHOUT CONTRAST 07/18/2024 07:50:28 PM TECHNIQUE: Multiplanar multisequence MRI of the head/brain was performed with and without the administration of intravenous contrast. COMPARISON: 10/06/2023 CLINICAL HISTORY: Neuro deficit, acute, stroke suspected. FINDINGS: BRAIN AND VENTRICLES: No acute infarct. No acute intracranial hemorrhage. No mass effect or midline shift. No hydrocephalus. The sella is unremarkable. Normal flow voids. Old bilateral cerebellar infarcts. Minimal nonspecific white matter T2-weighted signal hyperintensities, which may be associated with early chronic small vessel disease or migraine  headaches. ORBITS: No significant abnormality. SINUSES: No significant abnormality. BONES AND SOFT TISSUES: Normal bone marrow signal. No soft tissue abnormality. IMPRESSION: 1. No acute intracranial abnormality. 2. Old bilateral cerebellar infarcts. 3. Minimal nonspecific white matter T2-weighted signal hyperintensities, which may be seen with early chronic small vessel disease or migraine headaches. Electronically signed by: Franky Stanford MD 07/18/2024 07:59 PM EST RP Workstation: [id number]   CT Angio Chest Pulmonary Embolism (PE) W or WO Contrast Result Date: 07/18/2024 EXAM: CTA CHEST 07/18/2024 04:12:36 PM TECHNIQUE: CTA of the chest was performed after the administration of 75 mL of iohexol  (OMNIPAQUE ) 350 MG/ML injection. Multiplanar reformatted images are provided for review. MIP images are provided for review. Automated exposure control, iterative reconstruction, and/or weight based adjustment of the mA/kV was utilized to reduce the radiation dose to as low as reasonably achievable. COMPARISON: CT chest 10/06/2023. CLINICAL HISTORY: Pulmonary embolism (PE) suspected, high prob. FINDINGS: PULMONARY ARTERIES: Pulmonary arteries are adequately opacified for evaluation. No acute pulmonary embolus. Main pulmonary artery is normal in caliber. MEDIASTINUM: Mild cardiomegaly is unchanged. The pericardium demonstrates no acute abnormality. There is no acute abnormality of the thoracic aorta. LYMPH NODES: No mediastinal, hilar or axillary lymphadenopathy. LUNGS AND PLEURA: There are minimal patchy ground glass opacities in the bilateral lower lobes. One ground glass opacity is slightly nodular in the superior segment of the right lower lobe measuring 12 mm (image 8/63). No focal consolidation or pulmonary edema. No evidence of pleural effusion or pneumothorax. UPPER ABDOMEN: Limited images of the upper abdomen are unremarkable. SOFT TISSUES AND BONES: No acute bone or soft tissue abnormality. IMPRESSION: 1. No  evidence of pulmonary embolism. 2. Minimal patchy ground glass opacities in the bilateral lower lobes, including a slightly nodular ground glass opacity in the superior segment of the right lower lobe measuring 12 mm; recommend non-contrast chest CT at 6-12 months to confirm persistence, then CT every 2 years until 5 years, as per Fleischner Society Guidelines. Electronically signed by: Greig Pique MD 07/18/2024 05:08 PM EST RP Workstation: [id number]   CT Head Wo Contrast Result Date: 07/18/2024 EXAM: CT HEAD WITHOUT CONTRAST 07/18/2024 04:12:36 PM TECHNIQUE: CT of the head was performed without the administration of intravenous contrast. Automated exposure control, iterative reconstruction, and/or weight based adjustment of the mA/kV was utilized to reduce the radiation dose to as low as reasonably achievable. COMPARISON: 05/13/2024 CLINICAL HISTORY: Syncope/presyncope, cerebrovascular cause suspected. FINDINGS: BRAIN AND VENTRICLES: No acute hemorrhage. No evidence of acute infarct. No hydrocephalus. No extra-axial collection. No  mass effect or midline shift. ORBITS: No acute abnormality. SINUSES: Sequelae of prior sinus infection is present with wall thickening in the maxillary sinuses, right greater than left. No active disease is present. SOFT TISSUES AND SKULL: No acute soft tissue abnormality. No skull fracture. IMPRESSION: 1. No acute intracranial abnormality. Electronically signed by: Lonni Necessary MD 07/18/2024 04:40 PM EST RP Workstation: [id number]   DG Chest 2 View Result Date: 07/18/2024 CLINICAL DATA:  Chest pain. EXAM: CHEST - 2 VIEW COMPARISON:  12/06/2023. FINDINGS: The heart size and mediastinal contours are within normal limits. No overt pulmonary edema, focal consolidation, pleural effusion, or pneumothorax. No acute osseous abnormality. IMPRESSION: No acute cardiopulmonary findings. Electronically Signed   By: Harrietta Sherry M.D.   On: 07/18/2024 15:41     Microbiology: Results for orders placed or performed during the hospital encounter of 08/26/22  Resp panel by RT-PCR (RSV, Flu A&B, Covid) Anterior Nasal Swab     Status: None   Collection Time: 08/26/22  3:22 AM   Specimen: Anterior Nasal Swab  Result Value Ref Range Status   SARS Coronavirus 2 by RT PCR NEGATIVE NEGATIVE Final    Comment: (NOTE) SARS-CoV-2 target nucleic acids are NOT DETECTED.  The SARS-CoV-2 RNA is generally detectable in upper respiratory specimens during the acute phase of infection. The lowest concentration of SARS-CoV-2 viral copies this assay can detect is 138 copies/mL. A negative result does not preclude SARS-Cov-2 infection and should not be used as the sole basis for treatment or other patient management decisions. A negative result may occur with  improper specimen collection/handling, submission of specimen other than nasopharyngeal swab, presence of viral mutation(s) within the areas targeted by this assay, and inadequate number of viral copies(<138 copies/mL). A negative result must be combined with clinical observations, patient history, and epidemiological information. The expected result is Negative.  Fact Sheet for Patients:  bloggercourse.com  Fact Sheet for Healthcare Providers:  seriousbroker.it  This test is no t yet approved or cleared by the [place]  FDA and  has been authorized for detection and/or diagnosis of SARS-CoV-2 by FDA under an Emergency Use Authorization (EUA). This EUA will remain  in effect (meaning this test can be used) for the duration of the COVID-19 declaration under Section 564(b)(1) of the Act, 21 U.S.C.section 360bbb-3(b)(1), unless the authorization is terminated  or revoked sooner.       Influenza A by PCR NEGATIVE NEGATIVE Final   Influenza B by PCR NEGATIVE NEGATIVE Final    Comment: (NOTE) The Xpert Xpress SARS-CoV-2/FLU/RSV plus assay is  intended as an aid in the diagnosis of influenza from Nasopharyngeal swab specimens and should not be used as a sole basis for treatment. Nasal washings and aspirates are unacceptable for Xpert Xpress SARS-CoV-2/FLU/RSV testing.  Fact Sheet for Patients: bloggercourse.com  Fact Sheet for Healthcare Providers: seriousbroker.it  This test is not yet approved or cleared by the [place]  FDA and has been authorized for detection and/or diagnosis of SARS-CoV-2 by FDA under an Emergency Use Authorization (EUA). This EUA will remain in effect (meaning this test can be used) for the duration of the COVID-19 declaration under Section 564(b)(1) of the Act, 21 U.S.C. section 360bbb-3(b)(1), unless the authorization is terminated or revoked.     Resp Syncytial Virus by PCR NEGATIVE NEGATIVE Final    Comment: (NOTE) Fact Sheet for Patients: bloggercourse.com  Fact Sheet for Healthcare Providers: seriousbroker.it  This test is not yet approved or cleared by the [place]  FDA and has  been authorized for detection and/or diagnosis of SARS-CoV-2 by FDA under an Emergency Use Authorization (EUA). This EUA will remain in effect (meaning this test can be used) for the duration of the COVID-19 declaration under Section 564(b)(1) of the Act, 21 U.S.C. section 360bbb-3(b)(1), unless the authorization is terminated or revoked.  Performed at [facility], [address] [place], [place] [address]     Labs: CBC: Recent Labs  Lab 07/18/24 1427 07/19/24 0200  WBC 9.2 8.1  NEUTROABS 6.0  --   HGB 10.1* 9.8*  HCT 31.9* 30.0*  MCV 84.6 83.6  PLT 307 282   Basic Metabolic Panel: Recent Labs  Lab 07/18/24 1427 07/19/24 0200  NA 133* 133*  K 4.7 4.2  CL 96* 97*  CO2 28 26  GLUCOSE 79 313*  BUN 24* 19  CREATININE 1.28* 0.84  CALCIUM  9.5 8.7*  MG 1.9 1.9  PHOS  --  2.9    Liver Function Tests: Recent Labs  Lab 07/18/24 1427 07/19/24 0200  AST 26 14*  ALT 10 7  ALKPHOS 149* 130*  BILITOT 0.3 <0.2  PROT 8.1 6.9  ALBUMIN  3.5 3.1*   CBG: Recent Labs  Lab 07/19/24 0156 07/19/24 0842 07/19/24 1248  GLUCAP 251* 166* 312*    Discharge time spent: greater than 30 minutes.  Signed: Leita Blanch, MD [facility] 07/19/2024 "

## 2024-07-19 NOTE — Consult Note (Signed)
 Reason for Consult:Syncope Requesting Physician: Tobie  CC: Dizziness and syncope  I have been asked by Dr. Tobie to see this patient in consultation for syncope.  HPI: Mary Velasquez is an 44 y.o. 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  Prior to that patient had an episode of syncope.  Patient reports awakening with dizziness that she describes as lightheadedness.  Went to work anyway.  Felt bad at work and sat down.  Was noted by co-workers to be unresponsive.  Patient did not fall out of the chair.  After episode patient began to experience chest pain and with no relief presented for evaluation.  Patient reports recurrent syncopal events for about a year.  Etiology unclear.  Has been worked up by cardiology and has had long term monitoring but there has been an issue with its return and therefore no results are available.   Has had multiple MRI's of the brain.  As of April of last year was noted to have bilateral, chronic small cerebellar infarcts.  Although she does recall her balance being worse for about the last year she has not had any other complaints.    Past Medical History:  Diagnosis Date   Anemia    AVM (arteriovenous malformation)    Diabetes mellitus without complication (HCC)    History of kidney stones    Peripheral neuropathy    Pneumonia    Tachycardia     Past Surgical History:  Procedure Laterality Date   ABDOMINAL SURGERY     BREAST SURGERY     CESAREAN SECTION     x 2   CYSTOSCOPY N/A 08/21/2023   Procedure: CYSTOSCOPY;  Surgeon: Schermerhorn, Debby PARAS, MD;  Location: [facility] ORS;  Service: Gynecology;  Laterality: N/A;   ESOPHAGOGASTRODUODENOSCOPY N/A 07/10/2018   Procedure: ESOPHAGOGASTRODUODENOSCOPY (EGD);  Surgeon: Unk Corinn Skiff, MD;   Location: [facility] ENDOSCOPY;  Service: Gastroenterology;  Laterality: N/A;   ESOPHAGOGASTRODUODENOSCOPY     HYSTERECTOMY ABDOMINAL WITH SALPINGECTOMY Bilateral 08/21/2023   Procedure: HYSTERECTOMY ABDOMINAL WITH SALPINGECTOMY;  Surgeon: Schermerhorn, Debby PARAS, MD;  Location: [facility] ORS;  Service: Gynecology;  Laterality: Bilateral;   RIGHT/LEFT HEART CATH AND CORONARY ANGIOGRAPHY N/A 10/09/2023   Procedure: RIGHT/LEFT HEART CATH AND CORONARY ANGIOGRAPHY;  Surgeon: Anner Alm ORN, MD;  Location: [facility] INVASIVE CV LAB;  Service: Cardiovascular;  Laterality: N/A;   TUBAL LIGATION      Family History  Problem Relation Age of Onset   Heart disease Paternal Aunt    Heart attack Maternal Grandfather    Heart disease Maternal Grandfather    Heart attack Cousin    Heart disease Cousin    CAD Neg Hx    Seizures Neg Hx     Social History:  reports that she has never smoked. She has never used smokeless tobacco. She reports that she does not currently use drugs. She reports that she does not drink alcohol.  Allergies[1]  Medications:  Prior to Admission medications  Medication Sig Start Date End Date Taking? Authorizing Provider  ferrous sulfate  325 (65 FE) MG EC tablet Take 1 tablet (325 mg total) by mouth 2 (two) times daily. 08/24/23 08/23/24 Yes Schermerhorn, Debby PARAS, MD  furosemide  (LASIX ) 40 MG tablet Take 1 tablet (40 mg total) by mouth as needed. 04/29/24  Yes Donette City A, FNP  gabapentin  (NEURONTIN ) 300 MG capsule Take 600 mg by mouth at bedtime. 08/13/21  Yes [provider]  insulin  aspart (NOVOLOG ) 100 UNIT/ML FlexPen Inject 4 Units into the skin 3 (three) times daily with meals. Only take if eating a meal AND Blood Glucose (BG) is 80 or higher. Patient taking differently: Inject 6-8 Units into the skin 3 (three) times daily with meals. Only take if eating a meal AND Blood Glucose (BG) is 80 or higher. 01/16/23  Yes Awanda City, MD  insulin  glargine (LANTUS ) 100 UNIT/ML Solostar Pen  Inject 14 Units into the skin at bedtime. May substitute as needed per insurance. Patient taking differently: Inject 12 Units into the skin at bedtime. May substitute as needed per insurance. 01/16/23  Yes Awanda City, MD  JARDIANCE  10 MG TABS tablet Take 1 tablet (10 mg total) by mouth daily before breakfast. 04/29/24  Yes Donette City A, FNP  metFORMIN  (GLUCOPHAGE ) 1000 MG tablet Take 1,000 mg by mouth 2 (two) times daily with a meal. Breakfast & supper   Yes [provider]  metoprolol  succinate (TOPROL -XL) 25 MG 24 hr tablet Take 3 tablets (75 mg total) by mouth daily. Take with or immediately following a meal. 04/29/24 04/24/25 Yes Hackney, Tina A, FNP  nortriptyline  (PAMELOR ) 50 MG capsule Take 100 mg by mouth at bedtime. 08/13/21  Yes [provider]  Blood Glucose Monitoring Suppl DEVI 1 each by Does not apply route 3 (three) times daily. May dispense any manufacturer covered by patient's insurance. 01/16/23   Awanda City, MD  Glucose Blood (BLOOD GLUCOSE TEST STRIPS) STRP 1 each by Does not apply route 3 (three) times daily. Use as directed to check blood sugar. May dispense any manufacturer covered by patient's insurance and fits patient's device. 01/16/23   Awanda City, MD  Insulin  Pen Needle (PEN NEEDLES) 31G X 5 MM MISC 1 each by Does not apply route 3 (three) times daily. May dispense any manufacturer covered by patient's insurance. 01/16/23   Awanda City, MD  Lancet Device MISC 1 each by Does not apply route 3 (three) times daily. May dispense any manufacturer covered by patient's insurance. 01/16/23   Awanda City, MD  Lancets MISC 1 each by Does not apply route 3 (three) times daily. Use as directed to check blood sugar. May dispense any manufacturer covered by patient's insurance and fits patient's device. 01/16/23   Awanda City, MD     ROS: History obtained from the patient  General ROS: negative for - chills, fatigue, fever, night sweats, weight gain or weight loss Psychological  ROS: negative for - behavioral disorder, hallucinations, memory difficulties, mood swings or suicidal ideation Ophthalmic ROS: negative for - blurry vision, double vision, eye pain or loss of vision ENT ROS: negative for - epistaxis, nasal discharge, oral lesions, sore throat, tinnitus or vertigo Allergy and Immunology ROS: negative for - hives or itchy/watery eyes Hematological and Lymphatic ROS: negative for - bleeding problems, bruising or swollen lymph nodes Endocrine ROS: negative for - galactorrhea, hair pattern changes, polydipsia/polyuria or temperature intolerance Respiratory ROS: negative for - cough, hemoptysis, shortness of breath or wheezing Cardiovascular ROS: as noted in HPI Gastrointestinal ROS: negative for - abdominal pain, diarrhea, hematemesis, nausea/vomiting or stool incontinence Genito-Urinary ROS: negative for - dysuria, hematuria, incontinence or urinary frequency/urgency Musculoskeletal ROS: negative for - joint swelling or muscular weakness Neurological ROS: as noted in HPI Dermatological ROS: negative for rash and skin lesion changes   Physical Examination:  Blood pressure (!) 165/98, pulse (!) 101, temperature 98.3 F (36.8 C), temperature source Oral, resp. rate 13, height 5' (1.524 m), weight 86.7 kg, SpO2 95%.  HEENT-  Normocephalic, no lesions, without obvious abnormality.  Normal external eye and conjunctiva.  Normal external ears. Normal external nose, mucus membranes and septum. Cardiovascular- Single S1, S2 Lungs- CTA Abdomen- soft, non-tender; bowel sounds normal; no masses,  no organomegaly Extremities- mild bilateral edema Musculoskeletal-no joint tenderness, deformity or swelling Skin-warm and dry, no hyperpigmentation, vitiligo, or suspicious lesions  Neurological Examination   Mental Status: Alert, oriented, thought content appropriate.  Speech fluent without evidence of aphasia.  Able to follow 3 step commands without difficulty. Cranial  Nerves: II: Visual fields grossly normal, pupils equal, round, reactive to light and accommodation III,IV, VI: ptosis not present, extra-ocular motions intact bilaterally V,VII: smile symmetric, facial light touch sensation normal bilaterally VIII: hearing normal bilaterally XI: bilateral shoulder shrug XII: midline tongue extension Motor: Right : Upper extremity   5/5    Left:     Upper extremity   5/5  Lower extremity   5/5     Lower extremity   5/5 Tone and bulk:normal tone throughout; no atrophy noted Sensory: Pinprick and light touch intact throughout, bilaterally Deep Tendon Reflexes: 2+ in the upper extremities and absent in the lower extremities Plantars: Right: mute   Left: mute Cerebellar: normal finger-to-nose and normal heel-to-shin testing bilaterally.  Mild BUE tremor Gait: not tested due to safety concerns      Laboratory Studies:   Basic Metabolic Panel: Recent Labs  Lab 07/18/24 1427 07/19/24 0200  NA 133* 133*  K 4.7 4.2  CL 96* 97*  CO2 28 26  GLUCOSE 79 313*  BUN 24* 19  CREATININE 1.28* 0.84  CALCIUM  9.5 8.7*  MG 1.9 1.9  PHOS  --  2.9    Liver Function Tests: Recent Labs  Lab 07/18/24 1427 07/19/24 0200  AST 26 14*  ALT 10 7  ALKPHOS 149* 130*  BILITOT 0.3 <0.2  PROT 8.1 6.9  ALBUMIN  3.5 3.1*   No results for input(s): LIPASE, AMYLASE in the last 168 hours. No results for input(s): AMMONIA in the last 168 hours.  CBC: Recent Labs  Lab 07/18/24 1427 07/19/24 0200  WBC 9.2 8.1  NEUTROABS 6.0  --   HGB 10.1* 9.8*  HCT 31.9* 30.0*  MCV 84.6 83.6  PLT 307 282    Cardiac Enzymes: No results for input(s): CKTOTAL, CKMB, CKMBINDEX, TROPONINI in the last 168 hours.  BNP: Invalid input(s): POCBNP  CBG: Recent Labs  Lab 07/19/24 0156 07/19/24 0842  GLUCAP 251* 166*    Microbiology: Results for orders placed or performed during the hospital encounter of 08/26/22  Resp panel by RT-PCR (RSV, Flu A&B, Covid)  Anterior Nasal Swab     Status: None   Collection Time: 08/26/22  3:22 AM   Specimen: Anterior Nasal Swab  Result Value Ref Range Status   SARS Coronavirus 2 by RT PCR NEGATIVE NEGATIVE Final    Comment: (NOTE) SARS-CoV-2 target nucleic acids are NOT DETECTED.  The SARS-CoV-2 RNA is generally detectable in upper respiratory specimens during the acute phase of infection. The lowest concentration of SARS-CoV-2 viral copies this assay can detect is 138 copies/mL. A negative result does not preclude SARS-Cov-2 infection and should not be used as the sole basis for treatment or other patient management decisions. A negative result may occur with  improper specimen collection/handling, submission of specimen other than nasopharyngeal  swab, presence of viral mutation(s) within the areas targeted by this assay, and inadequate number of viral copies(<138 copies/mL). A negative result must be combined with clinical observations, patient history, and epidemiological information. The expected result is Negative.  Fact Sheet for Patients:  bloggercourse.com  Fact Sheet for Healthcare Providers:  seriousbroker.it  This test is no t yet approved or cleared by the [place]  FDA and  has been authorized for detection and/or diagnosis of SARS-CoV-2 by FDA under an Emergency Use Authorization (EUA). This EUA will remain  in effect (meaning this test can be used) for the duration of the COVID-19 declaration under Section 564(b)(1) of the Act, 21 U.S.C.section 360bbb-3(b)(1), unless the authorization is terminated  or revoked sooner.       Influenza A by PCR NEGATIVE NEGATIVE Final   Influenza B by PCR NEGATIVE NEGATIVE Final    Comment: (NOTE) The Xpert Xpress SARS-CoV-2/FLU/RSV plus assay is intended as an aid in the diagnosis of influenza from Nasopharyngeal swab specimens and should not be used as a sole basis for treatment. Nasal washings  and aspirates are unacceptable for Xpert Xpress SARS-CoV-2/FLU/RSV testing.  Fact Sheet for Patients: bloggercourse.com  Fact Sheet for Healthcare Providers: seriousbroker.it  This test is not yet approved or cleared by the [place]  FDA and has been authorized for detection and/or diagnosis of SARS-CoV-2 by FDA under an Emergency Use Authorization (EUA). This EUA will remain in effect (meaning this test can be used) for the duration of the COVID-19 declaration under Section 564(b)(1) of the Act, 21 U.S.C. section 360bbb-3(b)(1), unless the authorization is terminated or revoked.     Resp Syncytial Virus by PCR NEGATIVE NEGATIVE Final    Comment: (NOTE) Fact Sheet for Patients: bloggercourse.com  Fact Sheet for Healthcare Providers: seriousbroker.it  This test is not yet approved or cleared by the [place]  FDA and has been authorized for detection and/or diagnosis of SARS-CoV-2 by FDA under an Emergency Use Authorization (EUA). This EUA will remain in effect (meaning this test can be used) for the duration of the COVID-19 declaration under Section 564(b)(1) of the Act, 21 U.S.C. section 360bbb-3(b)(1), unless the authorization is terminated or revoked.  Performed at [facility], [address] [place], [place] [address]     Coagulation Studies: No results for input(s): LABPROT, INR in the last 72 hours.  Urinalysis: No results for input(s): COLORURINE, LABSPEC, PHURINE, GLUCOSEU, HGBUR, BILIRUBINUR, KETONESUR, PROTEINUR, UROBILINOGEN, NITRITE, LEUKOCYTESUR in the last 168 hours.  Invalid input(s): APPERANCEUR  Lipid Panel:  No results found for: CHOL, TRIG, HDL, CHOLHDL, VLDL, LDLCALC  HgbA1C:  Lab Results  Component Value Date   HGBA1C 14.4 (H) 07/19/2024    Urine Drug Screen:      Component Value  Date/Time   LABOPIA NONE DETECTED 10/08/2023 0017   COCAINSCRNUR NONE DETECTED 10/08/2023 0017   LABBENZ NONE DETECTED 10/08/2023 0017   AMPHETMU NONE DETECTED 10/08/2023 0017   THCU NONE DETECTED 10/08/2023 0017   LABBARB NONE DETECTED 10/08/2023 0017    Alcohol Level: No results for input(s): ETH in the last 168 hours.    Imaging: US  Carotid Bilateral Result Date: 07/19/2024 EXAM: US  CAROTID DUPLEX 07/19/2024 07:49:44 AM TECHNIQUE: Real-time grayscale, color flow and spectral Doppler sonographic images were obtained of the extracranial carotid system using a linear transducer. COMPARISON: None available CLINICAL HISTORY: Syncope. FINDINGS: RIGHT: Common carotid artery: 84 cm/s Internal carotid artery: 81 cm/s External carotid artery: 97 cm/s Right ICA/CCA ratio: 1.0 Plaque: None Vertebral artery: Antegrade  LEFT: Common carotid artery: 81 cm/s Internal carotid artery: 92 cm/s External carotid artery: 61 cm/s LEFT ICA/CCA ratio: 1.1 Plaque: None Vertebral artery: Antegrade IMPRESSION: 1. No hemodynamically significant stenosis (greater than 50%) within the extracranial internal carotid arteries. Electronically signed by: Evalene Coho MD 07/19/2024 08:09 AM EST RP Workstation: [id number]   MR Brain W and Wo Contrast Result Date: 07/18/2024 EXAM: MRI BRAIN WITH AND WITHOUT CONTRAST 07/18/2024 07:50:28 PM TECHNIQUE: Multiplanar multisequence MRI of the head/brain was performed with and without the administration of intravenous contrast. COMPARISON: 10/06/2023 CLINICAL HISTORY: Neuro deficit, acute, stroke suspected. FINDINGS: BRAIN AND VENTRICLES: No acute infarct. No acute intracranial hemorrhage. No mass effect or midline shift. No hydrocephalus. The sella is unremarkable. Normal flow voids. Old bilateral cerebellar infarcts. Minimal nonspecific white matter T2-weighted signal hyperintensities, which may be associated with early chronic small vessel disease or migraine headaches. ORBITS: No  significant abnormality. SINUSES: No significant abnormality. BONES AND SOFT TISSUES: Normal bone marrow signal. No soft tissue abnormality. IMPRESSION: 1. No acute intracranial abnormality. 2. Old bilateral cerebellar infarcts. 3. Minimal nonspecific white matter T2-weighted signal hyperintensities, which may be seen with early chronic small vessel disease or migraine headaches. Electronically signed by: Franky Stanford MD 07/18/2024 07:59 PM EST RP Workstation: [id number]   CT Angio Chest Pulmonary Embolism (PE) W or WO Contrast Result Date: 07/18/2024 EXAM: CTA CHEST 07/18/2024 04:12:36 PM TECHNIQUE: CTA of the chest was performed after the administration of 75 mL of iohexol  (OMNIPAQUE ) 350 MG/ML injection. Multiplanar reformatted images are provided for review. MIP images are provided for review. Automated exposure control, iterative reconstruction, and/or weight based adjustment of the mA/kV was utilized to reduce the radiation dose to as low as reasonably achievable. COMPARISON: CT chest 10/06/2023. CLINICAL HISTORY: Pulmonary embolism (PE) suspected, high prob. FINDINGS: PULMONARY ARTERIES: Pulmonary arteries are adequately opacified for evaluation. No acute pulmonary embolus. Main pulmonary artery is normal in caliber. MEDIASTINUM: Mild cardiomegaly is unchanged. The pericardium demonstrates no acute abnormality. There is no acute abnormality of the thoracic aorta. LYMPH NODES: No mediastinal, hilar or axillary lymphadenopathy. LUNGS AND PLEURA: There are minimal patchy ground glass opacities in the bilateral lower lobes. One ground glass opacity is slightly nodular in the superior segment of the right lower lobe measuring 12 mm (image 8/63). No focal consolidation or pulmonary edema. No evidence of pleural effusion or pneumothorax. UPPER ABDOMEN: Limited images of the upper abdomen are unremarkable. SOFT TISSUES AND BONES: No acute bone or soft tissue abnormality. IMPRESSION: 1. No evidence of pulmonary  embolism. 2. Minimal patchy ground glass opacities in the bilateral lower lobes, including a slightly nodular ground glass opacity in the superior segment of the right lower lobe measuring 12 mm; recommend non-contrast chest CT at 6-12 months to confirm persistence, then CT every 2 years until 5 years, as per Fleischner Society Guidelines. Electronically signed by: Greig Pique MD 07/18/2024 05:08 PM EST RP Workstation: [id number]   CT Head Wo Contrast Result Date: 07/18/2024 EXAM: CT HEAD WITHOUT CONTRAST 07/18/2024 04:12:36 PM TECHNIQUE: CT of the head was performed without the administration of intravenous contrast. Automated exposure control, iterative reconstruction, and/or weight based adjustment of the mA/kV was utilized to reduce the radiation dose to as low as reasonably achievable. COMPARISON: 05/13/2024 CLINICAL HISTORY: Syncope/presyncope, cerebrovascular cause suspected. FINDINGS: BRAIN AND VENTRICLES: No acute hemorrhage. No evidence of acute infarct. No hydrocephalus. No extra-axial collection. No mass effect or midline shift. ORBITS: No acute abnormality. SINUSES: Sequelae of prior sinus infection is present with wall thickening  in the maxillary sinuses, right greater than left. No active disease is present. SOFT TISSUES AND SKULL: No acute soft tissue abnormality. No skull fracture. IMPRESSION: 1. No acute intracranial abnormality. Electronically signed by: Lonni Necessary MD 07/18/2024 04:40 PM EST RP Workstation: [id number]   DG Chest 2 View Result Date: 07/18/2024 CLINICAL DATA:  Chest pain. EXAM: CHEST - 2 VIEW COMPARISON:  12/06/2023. FINDINGS: The heart size and mediastinal contours are within normal limits. No overt pulmonary edema, focal consolidation, pleural effusion, or pneumothorax. No acute osseous abnormality. IMPRESSION: No acute cardiopulmonary findings. Electronically Signed   By: Harrietta Sherry M.D.   On: 07/18/2024 15:41     Assessment/Plan:  44 y.o. 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  Prior to that patient had an episode of syncope.  Patient reports awakening with dizziness that she describes as lightheadedness.  At work became unresponsive.  Patient reports recurrent syncopal events for about a year.  Etiology unclear.  Has been worked up by cardiology and has had long term monitoring but there has been an issue with its return and therefore no results are available.   Has had multiple MRI's of the brain (all of these have been reviewed personally).  As of April of last year was noted to have bilateral, chronic small cerebellar infarcts, concern for cardioembolic etiology.  Although she does recall her balance being worse for about the last year she has not had any other complaints.  Do not feel these are the etiology for her syncopal events.  Vascular imaging reviewed personally and basilar appears patent.  Carotid dopplers show no carotid hemodynamically significant stenosis.   LDL 122 (05/19/24), A1c 14.4  Recommendations: ASA 81mg  daily High intensity statin initiation Agree with cardiac monitoring Blood sugar management with target A1c<7.0 Blood pressure management with target BP<140/80  Case discussed with Dr. Tobie Sonny Hock, MD Neurology  07/19/2024, 10:56 AM          [1]  Allergies Allergen Reactions   Oxycodone Anaphylaxis and Swelling    Throat swelling   Oxycontin [Oxycodone Hcl] Anaphylaxis and Swelling    Throat swelling

## 2024-07-19 NOTE — Discharge Instructions (Addendum)
 Keep log of your sugars and exact dose of insulin  taken and discuss management with adjusting insulin  with your Endocrinology  [facility] does NOT have prescription on file for Mounjaro .  Per telephone note on 06/28/24 from [facility] pharmacist Jaun Bash, RPh-CPP, Attempted x 2 to reach patient to inform her Mounjaro  is covered for $0 and provide counseling before initiation of the medication.   Please get in touch with [facility] clinic to discuss getting educated about it and getting it prescribed if interested in using it.

## 2024-07-19 NOTE — Consult Note (Signed)
 "  Cardiology Consultation   Patient ID: Mary Velasquez MRN: [id number]; DOB: [birth year]  Admit date: 07/18/2024 Date of Consult: 07/19/2024  PCP:  Cyrus Selinda Moose, PA-C   [facility] Providers Cardiologist:  Lonni Hanson, MD  Electrophysiologist:  OLE ONEIDA HOLTS, MD (Inactive)       Patient Profile: Mary Velasquez is a 43 y.o. female with a hx of HFimpEF, recurrent syncope, inappropriate sinus tachycardia, T2DM, hysterectomy complicated by post-op anemia, brain AVM, obesity, anemia, and peripheral neuropathy who is being seen 07/19/2024 for the evaluation of chest pain at the request of Dr. Tobie.  History of Present Illness: Mary Velasquez establish care with Dr. Darliss 09/2022 for elevated blood pressure and tachycardia.  She had ongoing elevated heart rate for 1 year prior and positive orthostatics with BP dropping to 80 systolic on standing.  She was started on ivabradine  for inappropriate sinus tachycardia.  Echo 10/2022 showed EF 60 to 65% without RWMA, mild LVH, G1 DD, and small pericardial effusion present.  She had an unwitnessed episode of syncope 09/2023 with lightheadedness and dizziness.  Noted several falls with possible syncope.  She had second syncopal episode/12/2023 which was preceded by lightheadedness.  Echo showed EF 30 to 35% with global hypokinesis.  R/LHC showed normal coronary arteries, mild to moderate pulmonary hypertension with severely elevated LV pressure.  She was started on GDMT.  Cardiac MRI showed EF 35% with no LGE or scar and no evidence of infiltrative or inflammatory disease.  GDMT was limited in the outpatient setting by hypotension.  Repeat echo 12/2023 revealed EF improved 50 to 55% with no WMA, mild LVH, and mild to moderate MR.  ZIO monitor has been attempted several times to assess for arrhythmia genic cause of syncope.  However, has yet to be completed.  Patient reports that she had been feeling well until yesterday when she was at  work.  She started feeling lightheaded and dizzy with movement and endorses at least 1 episode of syncope.  This was witnessed by her coworker who believes she was unconscious for a couple of minutes before coming to.  She reports feeling groggy after awakening.  She denies preceding chest pain.  However, later in the day, she endorses an episode of chest discomfort which started in the center of her chest and radiated to the right side.  She describes this as a throbbing discomfort.  This prompted her to call EMS.  She received aspirin  and sublingual nitroglycerin with improvement in chest discomfort.  In the ED, BP 114/68, HR 111 bpm with otherwise normal vital signs.  Pertinent labs include hemoglobin 10.1, hematocrit 31.9, BUN 24, creatinine 1.28, sodium 133.  Troponin negative x 2.  EKG reveals sinus tachycardia, HR 110 bpm.  Chest x-ray was without acute cardiopulmonary findings.  CTA of the head was without acute intracranial abnormality.  CTA of the chest was negative for PE with an incidental finding of slightly nodular ground glass opacity in the superior segment of the right lower lobe measuring 12 mm with recommendation for repeat chest CT in 6 to 12 months.  MRI of the brain was negative for acute intracranial abnormality but did reveal old bilateral cerebral infarcts.  Carotid Dopplers were negative for hemodynamically significant stenosis.  Patient was given 1.5 L of IV fluids.  Continued to be tachycardic.  Found to have ataxic gait with ambulation.  Neurology consult pending.  Cardiology was asked to consult for further evaluation of chest pain.  At time  of cardiology consult, patient denies recurrence of chest pain.  She endorses ongoing lightheadedness/dizziness, worsened with sitting upright and standing.  She endorses chronic feelings of heart racing, unchanged from baseline.  She denies shortness of breath and orthopnea.  She reports compliance with medications at home.  Has only needed 1 dose  of as needed Lasix  in the past week.   Past Medical History:  Diagnosis Date   Anemia    AVM (arteriovenous malformation)    Diabetes mellitus without complication (HCC)    History of kidney stones    Peripheral neuropathy    Pneumonia    Tachycardia     Past Surgical History:  Procedure Laterality Date   ABDOMINAL SURGERY     BREAST SURGERY     CESAREAN SECTION     x 2   CYSTOSCOPY N/A 08/21/2023   Procedure: CYSTOSCOPY;  Surgeon: Schermerhorn, Debby PARAS, MD;  Location: [facility] ORS;  Service: Gynecology;  Laterality: N/A;   ESOPHAGOGASTRODUODENOSCOPY N/A 07/10/2018   Procedure: ESOPHAGOGASTRODUODENOSCOPY (EGD);  Surgeon: Unk Corinn Skiff, MD;  Location: [facility] ENDOSCOPY;  Service: Gastroenterology;  Laterality: N/A;   ESOPHAGOGASTRODUODENOSCOPY     HYSTERECTOMY ABDOMINAL WITH SALPINGECTOMY Bilateral 08/21/2023   Procedure: HYSTERECTOMY ABDOMINAL WITH SALPINGECTOMY;  Surgeon: Schermerhorn, Debby PARAS, MD;  Location: [facility] ORS;  Service: Gynecology;  Laterality: Bilateral;   RIGHT/LEFT HEART CATH AND CORONARY ANGIOGRAPHY N/A 10/09/2023   Procedure: RIGHT/LEFT HEART CATH AND CORONARY ANGIOGRAPHY;  Surgeon: Anner Alm ORN, MD;  Location: [facility] INVASIVE CV LAB;  Service: Cardiovascular;  Laterality: N/A;   TUBAL LIGATION         Scheduled Meds:  enoxaparin  (LOVENOX ) injection  40 mg Subcutaneous Q24H   ferrous sulfate   325 mg Oral BID WC   gabapentin   600 mg Oral QHS   insulin  aspart  0-15 Units Subcutaneous TID WC   insulin  aspart  0-5 Units Subcutaneous QHS   Continuous Infusions:  PRN Meds: acetaminophen  **OR** acetaminophen , ondansetron  **OR** ondansetron  (ZOFRAN ) IV  Allergies:   Allergies[1]  Social History:   Social History   Socioeconomic History   Marital status: Single    Spouse name: Not on file   Number of children: 2   Years of education: Not on file   Highest education level: Some college, no degree  Occupational History   Not on file  Tobacco Use    Smoking status: Never   Smokeless tobacco: Never  Vaping Use   Vaping status: Never Used  Substance and Sexual Activity   Alcohol use: No   Drug use: Not Currently   Sexual activity: Not on file  Other Topics Concern   Not on file  Social History Narrative   Lives alone   Social Drivers of Health   Tobacco Use: Low Risk (07/18/2024)   Patient History    Smoking Tobacco Use: Never    Smokeless Tobacco Use: Never    Passive Exposure: Not on file  Financial Resource Strain: Low Risk  (05/19/2024)   Received from [facility]   Overall Financial Resource Strain (CARDIA)    Difficulty of Paying Living Expenses: Not hard at all  Food Insecurity: No Food Insecurity (05/19/2024)   Received from [facility]   Epic    Within the past 12 months, you worried that your food would run out before you got the money to buy more.: Never true    Within the past 12 months, the food you bought just didn't last and you didn't have money  to get more.: Never true  Transportation Needs: No Transportation Needs (05/19/2024)   Received from [facility] - Transportation    In the past 12 months, has lack of transportation kept you from medical appointments or from getting medications?: No    Lack of Transportation (Non-Medical): No  Physical Activity: Not on file  Stress: Not on file  Social Connections: Not on file  Intimate Partner Violence: Not At Risk (12/07/2023)   Humiliation, Afraid, Rape, and Kick questionnaire    Fear of Current or Ex-Partner: No    Emotionally Abused: No    Physically Abused: No    Sexually Abused: No  Depression (PHQ2-9): Not on file  Alcohol Screen: Not on file  Housing: Low Risk  (05/19/2024)   Received from [facility]   Epic    In the last 12 months, was there a time when you were not able to pay the mortgage or rent on time?: No    In the past 12 months, how many times have you moved  where you were living?: 0    At any time in the past 12 months, were you homeless or living in a shelter (including now)?: No  Utilities: Not At Risk (05/19/2024)   Received from [facility]   Epic    In the past 12 months has the electric, gas, oil, or water  company threatened to shut off services in your home?: No  Health Literacy: Not on file    Family History:    Family History  Problem Relation Age of Onset   Heart disease Paternal Aunt    Heart attack Maternal Grandfather    Heart disease Maternal Grandfather    Heart attack Cousin    Heart disease Cousin    CAD Neg Hx    Seizures Neg Hx      ROS:  Please see the history of present illness.   Physical Exam/Data: Vitals:   07/18/24 1730 07/18/24 2021 07/19/24 0008 07/19/24 0345  BP: (!) 154/98 (!) 154/124 (!) 157/110 (!) 153/106  Pulse: 98 (!) 108 (!) 112 (!) 106  Resp: 12 16 20 19   Temp:  98.3 F (36.8 C) 98.4 F (36.9 C) 98.2 F (36.8 C)  TempSrc:  Oral  Oral  SpO2: 97% 99% 97% 96%  Weight:      Height:       No intake or output data in the 24 hours ending 07/19/24 0810    07/18/2024    2:21 PM 06/21/2024    1:46 PM 05/13/2024   10:02 AM  Last 3 Weights  Weight (lbs) 191 lb 1.6 oz 192 lb 6.4 oz 175 lb  Weight (kg) 86.682 kg 87.272 kg 79.379 kg     Body mass index is 37.32 kg/m.  General:  Well nourished, well developed, in no acute distress HEENT: normal Neck: no JVD Vascular: No carotid bruits; Distal pulses 2+ bilaterally Cardiac:  normal S1, S2; RRR; no murmur  Lungs:  clear to auscultation bilaterally, no wheezing, rhonchi or rales  Abd: soft, nontender, no hepatomegaly  Ext: no edema Skin: warm and dry  Psych:  Normal affect   EKG:  The EKG was personally reviewed and demonstrates: Sinus tachycardia, rate 110 bpm Telemetry:  Telemetry was personally reviewed and demonstrates: Sinus tachycardia  Relevant CV Studies:  12/2023 2D echo 1. Left ventricular ejection fraction,  by estimation, is 50 to 55%. The  left ventricle has low normal function.  The left ventricle has no regional  wall motion abnormalities. There is mild left ventricular hypertrophy. The  average left ventricular global   longitudinal strain is -11.4 %. The global longitudinal strain is  abnormal.   2. Right ventricular systolic function is normal. The right ventricular  size is normal.   3. The mitral valve is normal in structure. Mild to moderate mitral valve  regurgitation. No evidence of mitral stenosis.   4. The aortic valve is normal in structure. Aortic valve regurgitation is  mild. No aortic stenosis is present.   5. The inferior vena cava is normal in size with greater than 50%  respiratory variability, suggesting right atrial pressure of 3 mmHg.   09/2023 Cardiac MRI 1. Normal LV size, moderately reduced LV systolic function. LVEF 35%. 2.  No LGE or scar. 3.  No evidence for infiltrative or inflammatory disease. 4.  Mildly reduced RV systolic function. 5.  No significant valvular abnormalities. 6.  Findings suggest non ischemic cardiomyopathy.  09/2023 R/LHC Angiographically normal coronary arteries with a right dominant system  Mild to moderate WHO Class II Pulm Hypertension: RAP mean 10 mmHg, RV P-EDP 42/6-21 mmHg PAP-mean 43/21-32 millimercury; PCWP mean more consistent with 28 mmHg. LV P-EDP 136/2-36 mmHg; AoP-MAP 134/88-108 mmHg. AO sat 88%, PA sat 57% on room air. Fick cardiac output-index 5.08-2.89. PVR 1 89D/S; index through 3 32D/SI  Laboratory Data: High Sensitivity Troponin:  No results for input(s): TROPONINIHS in the last 720 hours.  Recent Labs  Lab 07/18/24 1427 07/18/24 1625  TRNPT 18 <15      Chemistry Recent Labs  Lab 07/18/24 1427 07/19/24 0200  NA 133* 133*  K 4.7 4.2  CL 96* 97*  CO2 28 26  GLUCOSE 79 313*  BUN 24* 19  CREATININE 1.28* 0.84  CALCIUM  9.5 8.7*  MG 1.9 1.9  GFRNONAA 53* >60  ANIONGAP 9 10    Recent Labs  Lab  07/18/24 1427 07/19/24 0200  PROT 8.1 6.9  ALBUMIN  3.5 3.1*  AST 26 14*  ALT 10 7  ALKPHOS 149* 130*  BILITOT 0.3 <0.2   Lipids No results for input(s): CHOL, TRIG, HDL, LABVLDL, LDLCALC, CHOLHDL in the last 168 hours.  Hematology Recent Labs  Lab 07/18/24 1427 07/19/24 0200  WBC 9.2 8.1  RBC 3.77* 3.59*  HGB 10.1* 9.8*  HCT 31.9* 30.0*  MCV 84.6 83.6  MCH 26.8 27.3  MCHC 31.7 32.7  RDW 13.1 13.1  PLT 307 282   Thyroid  No results for input(s): TSH, FREET4 in the last 168 hours.  BNPNo results for input(s): BNP, PROBNP in the last 168 hours.  DDimer No results for input(s): DDIMER in the last 168 hours.  Radiology/Studies:  MR Brain W and Wo Contrast Result Date: 07/18/2024 IMPRESSION: 1. No acute intracranial abnormality. 2. Old bilateral cerebellar infarcts. 3. Minimal nonspecific white matter T2-weighted signal hyperintensities, which may be seen with early chronic small vessel disease or migraine headaches. Electronically signed by: Franky Stanford MD 07/18/2024 07:59 PM EST RP Workstation: [id number]   CT Angio Chest Pulmonary Embolism (PE) W or WO Contrast Result Date: 07/18/2024 IMPRESSION: 1. No evidence of pulmonary embolism. 2. Minimal patchy ground glass opacities in the bilateral lower lobes, including a slightly nodular ground glass opacity in the superior segment of the right lower lobe measuring 12 mm; recommend non-contrast chest CT at 6-12 months to confirm persistence, then CT every 2 years until 5 years, as per Fleischner Society Guidelines. Electronically signed by: Greig Pique  MD 07/18/2024 05:08 PM EST RP Workstation: [id number]   CT Head Wo Contrast Result Date: 07/18/2024 IMPRESSION: 1. No acute intracranial abnormality. Electronically signed by: Lonni Necessary MD 07/18/2024 04:40 PM EST RP Workstation: [id number]   DG Chest 2 View Result Date: 07/18/2024 IMPRESSION: No acute cardiopulmonary findings. Electronically Signed   By:  Harrietta Sherry M.D.   On: 07/18/2024 15:41   Assessment and Plan:  Chest pain - Cardiac catheterization 09/2023 was without angiographic evidence of CAD - Most recent echo 12/2023 with EF 50 to 55% and no RWMA - Patient endorses 1 episode of chest discomfort yesterday which came on at rest, relieved with aspirin  and sublingual nitroglycerin - EKG without acute ischemic changes - Troponin negative x 2 - Recommend updating echo with further recommendations pending results.  Do not suspect further ischemic evaluation would be indicated if echo is unchanged from prior.  HFimpEF NICM - Cardiac MRI 09/2023 with EF 35%, mild RV dysfunction, no LGE - Echo 12/2023 with EF 50 to 55%, improved from prior 09/2023 EF 30 to 35% - Volume status difficult to assess due to body habitus, although does not appear grossly volume overloaded - Received IV fluids in the ED with improvement in renal function - Recommend net even fluid balance - Continue to monitor kidney function, strict I/Os, and daily weights  - Will resume PTA metoprolol  succinate 75 mg daily and Jardiance  10 mg daily. Lasix  20 mg daily as needed.  - GDMT has been limited in the past due to hypotension  Hx syncope - Felt to be likely secondary to orthostatic hypotension - Carotid Dopplers this admission without significant stenosis - ZIO monitor is often ordered in the past but never completed.  No significant arrhythmia on telemetry. - Arrhythmia cannot be ruled out without ZIO results.  Recommend ZIO prior to discharge.  Inappropriate sinus tachycardia - Longstanding history of inappropriate sinus tachycardia dating back to at least 2024 - Continue metoprolol  succinate as above  Ataxia Chronic cerebellar infarcts - Noted on MRI of the brain this admission - Pending neurology consult   For questions or updates, please contact  [facility] Please consult [email or web address] for contact info under      Signed, Lesley LITTIE Maffucci,  PA-C  07/19/2024 8:10 AM     [1]  Allergies Allergen Reactions   Oxycodone Anaphylaxis and Swelling    Throat swelling   Oxycontin [Oxycodone Hcl] Anaphylaxis and Swelling    Throat swelling    "

## 2024-07-19 NOTE — ED Notes (Signed)
 Fall risk bundle in place.

## 2024-07-19 NOTE — Progress Notes (Signed)
*  PRELIMINARY RESULTS* Echocardiogram 2D Echocardiogram has been performed.  Mary Velasquez 07/19/2024, 11:59 AM

## 2024-07-19 NOTE — Inpatient Diabetes Management (Addendum)
 Inpatient Diabetes Program Recommendations  AACE/ADA: New Consensus Statement on Inpatient Glycemic Control  Target Ranges:  Prepandial:   less than 140 mg/dL      Peak postprandial:   less than 180 mg/dL (1-2 hours)      Critically ill patients:  140 - 180 mg/dL    Latest Reference Range & Units 07/19/24 01:56 07/19/24 08:42  Glucose-Capillary 70 - 99 mg/dL 748 (H) 833 (H)    Latest Reference Range & Units 07/09/18 03:15 01/15/23 17:46 10/09/23 18:40 07/19/24 01:30  Hemoglobin A1C 4.8 - 5.6 % 13.3 (H) 15.4 (H) 10.4 (H) 14.4 (H)    Latest Reference Range & Units 07/18/24 14:27 07/19/24 02:00  Glucose 70 - 99 mg/dL 79 686 (H)    Review of Glycemic Control  Diabetes history: DM2 Outpatient Diabetes medications: Lantus  10 units at bedtime, Novolog  6-8 units TID with meals (has taken up to 16 units if CBG high), Metformin  1000 mg BID, Jardiance  10 mg QAM Current orders for Inpatient glycemic control: Novolog  0-15 units TID with meals, Novolog  0-5 units at bedtime, Jardiance  10 mg daily  Inpatient Diabetes Program Recommendations:    HbgA1C:  A1C 14.4% on 07/19/24 indicating an average glucose of 367 mg/dl over the past 2-3 months.   Outpatient DM: At time of discharge, please provide Novolog  correction scale for patient to use for correction (in addition to meal coverage she is already taking) as an outpatient.   NOTE: Patient admitted with chest pain, syncope, ataxia, and AKI. Per chart review, patient seen PCP on 05/19/24 and A1C was >15.5% at that time. Patient was asked to follow up with Endocrinology. Last Endocrinology visit was 11/17/23 with KYM Cohn, PA.   Addendum 07/19/24@12 :10-Spoke with patient at bedside in ED about diabetes and home regimen for diabetes control. Patient reports being followed by Endocrinology for diabetes management and currently taking Lantus  10 units at bedtime, Novolog  6-8 units TID with meals (has taken up to 16 units if CBG high), Metformin   1000 mg BID, and Jardiance  10 mg QAM as an outpatient for diabetes control. Patient reports taking DM medications as prescribed and she has all needed DM medications and supplies.   Patient does not have a correction scale to use when glucose is high, she guesses at the dose to take extra.  Patient reports checking glucose 2-3 times per day and that it is usually in the 100's mg/dl in the morning and goes up higher during the day after eating.  Patient reports her last A1C was over 15%.   Discussed A1C results (14.4% on 07/19/24) and explained that current A1C indicates an average glucose of 367 mg/dl over the past 2-3 months. Discussed glucose and A1C goals. Discussed importance of checking CBGs and maintaining good CBG control to prevent long-term and short-term complications.  Stressed to the patient the importance of improving glycemic control to prevent further complications from uncontrolled diabetes. Noted telephone note from [facility] pharmacist Jaun Bash, RPh-CPP on 06/28/24 regarding Mounjaro  (Attempted x 2 to reach patient to inform her Mounjaro  is covered for $0 and provide counseling before initiation of the medication.)  Asked patient if she was aware that the Mounjaro  was covered. Patient states she never heard back from the Heart Failure clinic regarding Mounjaro . Informed patient that per the note, Mounjaro  is covered and copay is $0. Patient states that if Mounjaro  is prescribed it would be at the [facility]. Informed patient I would check to see if [facility] has  Rx for Mounjaro .  Patient states that she has an appointment with Endocrinology in February 2026. Encouraged patient to follow up with [facility] regarding Mounjaro  if she does not have Rx for [facility].   Patient states she has used CGM sensors in past and does not like them; she prefers to do finger sticks. Encouraged patient to check glucose 3-4 times a day and keep a log  of insulin  taken so Endocrinology can use the information to assist with improving DM control.   Patient verbalized understanding of information discussed and reports no further questions at this time related to diabetes.  Thanks, Earnie Gainer, RN, MSN, CDCES Diabetes Coordinator Inpatient Diabetes Program [phone] (Team Pager from 8am to 5pm)

## 2024-07-20 ENCOUNTER — Telehealth: Payer: Self-pay

## 2024-07-20 MED ORDER — MOUNJARO 2.5 MG/0.5ML ~~LOC~~ SOAJ: 2.5000 mg | SUBCUTANEOUS | 1 refills | Status: DC

## 2024-07-20 NOTE — Telephone Encounter (Signed)
 Pt had previously discussed starting mounjaro  at her last appt. Per PharmD medications were not sent due to inability to reach pt. Pt called and relayed contraceptive information per pharmd note. Pt states she has had a hysterectomy. Orders placed to preferred pharmacy.

## 2024-07-22 ENCOUNTER — Telehealth: Payer: Self-pay | Admitting: Family

## 2024-07-22 ENCOUNTER — Other Ambulatory Visit: Payer: Self-pay

## 2024-07-22 MED ORDER — MOUNJARO 2.5 MG/0.5ML ~~LOC~~ SOAJ
2.5000 mg | SUBCUTANEOUS | 1 refills | Status: AC
  Filled 2024-07-22: qty 2, 28d supply, fill #0

## 2024-07-22 NOTE — Telephone Encounter (Signed)
 Patient request Mounjaro  be sent to [facility] pharmacy, [facility] is charging too much for her meds. Thanks

## 2024-07-22 NOTE — Telephone Encounter (Signed)
 Medications switched to preferred pharmacy

## 2024-07-22 NOTE — Addendum Note (Signed)
 Addended by: SHARL GRATE A on: 07/22/2024 03:20 PM   Modules accepted: Orders

## 2024-07-27 ENCOUNTER — Encounter: Payer: Self-pay | Admitting: Internal Medicine

## 2024-07-27 ENCOUNTER — Ambulatory Visit: Admitting: Internal Medicine

## 2024-07-27 VITALS — BP 120/80 | HR 108 | Temp 97.9°F | Ht 60.0 in | Wt 190.8 lb

## 2024-07-27 DIAGNOSIS — R911 Solitary pulmonary nodule: Secondary | ICD-10-CM

## 2024-07-27 DIAGNOSIS — E669 Obesity, unspecified: Secondary | ICD-10-CM | POA: Diagnosis not present

## 2024-07-27 DIAGNOSIS — R0602 Shortness of breath: Secondary | ICD-10-CM

## 2024-07-27 DIAGNOSIS — R918 Other nonspecific abnormal finding of lung field: Secondary | ICD-10-CM

## 2024-07-27 DIAGNOSIS — Z6837 Body mass index (BMI) 37.0-37.9, adult: Secondary | ICD-10-CM | POA: Diagnosis not present

## 2024-07-27 DIAGNOSIS — I428 Other cardiomyopathies: Secondary | ICD-10-CM

## 2024-07-27 DIAGNOSIS — R1112 Projectile vomiting: Secondary | ICD-10-CM

## 2024-07-27 LAB — NITRIC OXIDE: Nitric Oxide: 12

## 2024-07-27 NOTE — Patient Instructions (Addendum)
 Repeat CT chest in 8 weeks Recommend follow-up with heart failure clinic Consider GI consultation for GI symptoms Recommend pumping function testing to assess lung function

## 2024-07-27 NOTE — Progress Notes (Unsigned)
 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 subsequently followed up by chest pain Patient was seen in the ER subsequently obtain a CT of her chest which showed bilateral infiltrates along with a nodule opacity in the right lower lobe  After 1 day in the ER patient was discharged patient possibly had hypoglycemia also had episodic nausea vomiting with some coughing spells Patient without any significant respiratory compromise no shortness of breath or wheezing  No exacerbation at this time No evidence of heart failure at this time No evidence or signs of infection at this time No respiratory distress No fevers, chills, nausea, vomiting, diarrhea No evidence of lower extremity edema No evidence hemoptysis  Patient is a non-smoker Patient is nonalcoholic No known drug abuse   Cardiac history reviewed in detail Chronic HFimpEF: Nonischemic cardiomyopathy.  Echo in 4/25 with LVEF 30-35%, mild LVH, GLS of -6.6%, RV function mildly reduced with moderately elevate PA systolic pressure, moderate MR, mild AR, moderate TR.  LHC in 4/25 showed no significant CAD. CMRI in 4/25 showed LV EF 35%, mild RV dysfunction, no LGE. Echo 7/25 showed significant recovery of LV function with EF up to 50-55% with mild LVH, normal RV.   - genetics testing was indeterminate - continue lasix  40 mg daily; PRN weight gain guidelines   PAST MEDICAL HISTORY   Past Medical History:  Diagnosis Date   Anemia    AVM (arteriovenous malformation)    Diabetes mellitus  without complication (HCC)    History of kidney stones    Peripheral neuropathy    Pneumonia    Tachycardia      SURGICAL HISTORY   Past Surgical History:  Procedure Laterality Date   ABDOMINAL SURGERY     BREAST SURGERY     CESAREAN SECTION     x 2   CYSTOSCOPY N/A 08/21/2023   Procedure: CYSTOSCOPY;  Surgeon: Schermerhorn, Debby PARAS, MD;  Location: [facility] ORS;  Service: Gynecology;  Laterality: N/A;   ESOPHAGOGASTRODUODENOSCOPY N/A 07/10/2018   Procedure: ESOPHAGOGASTRODUODENOSCOPY (EGD);  Surgeon: Unk Corinn Skiff, MD;  Location: [facility] ENDOSCOPY;  Service: Gastroenterology;  Laterality: N/A;   ESOPHAGOGASTRODUODENOSCOPY     HYSTERECTOMY ABDOMINAL WITH SALPINGECTOMY Bilateral 08/21/2023   Procedure: HYSTERECTOMY ABDOMINAL WITH SALPINGECTOMY;  Surgeon: Schermerhorn, Debby PARAS, MD;  Location: [facility] ORS;  Service: Gynecology;  Laterality: Bilateral;   RIGHT/LEFT HEART CATH AND CORONARY ANGIOGRAPHY N/A 10/09/2023   Procedure: RIGHT/LEFT HEART CATH AND CORONARY ANGIOGRAPHY;  Surgeon: Anner Alm ORN, MD;  Location: [facility] INVASIVE CV LAB;  Service: Cardiovascular;  Laterality: N/A;   TUBAL LIGATION       FAMILY HISTORY   Family History  Problem Relation Age of Onset   Heart disease Paternal Aunt    Heart attack Maternal Grandfather    Heart disease Maternal Grandfather    Heart attack Cousin    Heart disease Cousin    CAD Neg Hx    Seizures Neg Hx      SOCIAL HISTORY   Social History[1]   MEDICATIONS  Home Medication:  Current Outpatient Rx   Order #: [id number] Class: Normal   Order #: [id number] Class: Normal   Order #: [id number] Class: Normal   Order #: [id number] Class: Historical Med   Order #: [id number] Class: Normal   Order #: [id number] Class: Historical Med   Order #: [id number] Class: Normal   Order #: [id number] Class: Normal   Order #: [id number] Class: Historical Med   Order #: [id number] Class: Normal   Order #: [id number] Class: Normal   Order #: [id number] Class:  Normal   Order #: [id number] Class: Normal   Order #: [id number] Class: Historical Med   Order #: [id number] Class: Normal   Order #: [id number] Class: Historical Med   Order #: [id number] Class: Normal   Order #: [id number] Class: Historical Med    Current Medication: Current Medications[2]    ALLERGIES   Oxycodone and Oxycontin [oxycodone hcl]   BP 120/80   Pulse (!) 108   Temp 97.9 F (36.6 C)   Ht 5' (1.524 m)   Wt 190 lb 12.8 oz (86.5 kg)   LMP  (LMP Unknown) Comment: UPT neg 08/21/23  SpO2 100%   BMI 37.26 kg/m    Review of Systems: Gen:  Denies  fever, sweats, chills weight loss  HEENT: Denies blurred vision, double vision, ear pain, eye pain, hearing loss, nose bleeds, sore throat Cardiac:  No dizziness, chest pain or heaviness, chest tightness,edema, No JVD Resp:   No cough, -sputum production, -shortness of breath,+wheezing, -hemoptysis,  Other:  All other systems negative   Physical Examination:   General Appearance: No distress  EYES PERRLA, EOM intact.   NECK Supple, No JVD Pulmonary: normal breath sounds, No wheezing.  CardiovascularNormal S1,S2.  No m/r/g.   Abdomen: Benign, Soft, non-tender. Neurology UE/LE 5/5 strength, no focal deficits Ext pulses intact, cap refill intact ALL OTHER ROS ARE NEGATIVE      IMAGING    ECHOCARDIOGRAM COMPLETE Result Date: 07/19/2024    ECHOCARDIOGRAM REPORT   Patient Name:   Mary Velasquez Date of Exam: 07/19/2024 Medical Rec #:  [id number]      Height:       60.0 in Accession #:    [id number]     Weight:       191.1 lb Date of Birth:  06/17/1981      BSA:          1.831 m Patient Age:    43 years       BP:           165/98 mmHg Patient Gender: F              HR:           101 bpm. Exam Location:  [facility] Procedure: 2D Echo, Cardiac Doppler, Color Doppler, 3D Echo and Strain Analysis            (Both Spectral and Color Flow Doppler were utilized during            procedure). Indications:     Chest pain R07.9                   Syncope R55  History:         Patient has prior history of Echocardiogram examinations, most                  recent 01/14/2024. Risk Factors:Diabetes.  Sonographer:     Christopher Furnace Referring Phys:  [id number] OLADAPO ADEFESO Diagnosing Phys: Deatrice Cage MD  Sonographer Comments: Global longitudinal strain was attempted. IMPRESSIONS  1. Left  ventricular ejection fraction, by estimation, is 55 to 60%. The left ventricle has normal function. The left ventricle has no regional wall motion abnormalities. There is moderate left ventricular hypertrophy. Left ventricular diastolic parameters are consistent with Grade I diastolic dysfunction (impaired relaxation).  2. Right ventricular systolic function is normal. The right ventricular size is normal. There is normal pulmonary artery systolic pressure.  3. A small pericardial effusion is present. The pericardial effusion is posterior to the left ventricle. There is no evidence of cardiac tamponade.  4. The mitral valve is normal in structure. No evidence of mitral valve regurgitation. No evidence of mitral stenosis.  5. The aortic valve is normal in structure. Aortic valve regurgitation is trivial. No aortic stenosis is present.  6. The inferior vena cava is normal in size with greater than 50% respiratory variability, suggesting right atrial pressure of 3 mmHg. FINDINGS  Left Ventricle: Left ventricular ejection fraction, by estimation, is 55 to 60%. The left ventricle has normal function. The left ventricle has no regional wall motion abnormalities. Global longitudinal strain performed but not reported based on interpreter judgement due to suboptimal tracking. The left ventricular internal cavity size was normal in size. There is moderate left ventricular hypertrophy. Left ventricular diastolic parameters are consistent with Grade I diastolic dysfunction (impaired relaxation). Right Ventricle: The right ventricular size is normal. No increase in right ventricular wall  thickness. Right ventricular systolic function is normal. There is normal pulmonary artery systolic pressure. The tricuspid regurgitant velocity is 1.49 m/s, and  with an assumed right atrial pressure of 3 mmHg, the estimated right ventricular systolic pressure is 11.9 mmHg. Left Atrium: Left atrial size was normal in size. Right Atrium: Right atrial size was normal in size. Pericardium: A small pericardial effusion is present. The pericardial effusion is posterior to the left ventricle. There is no evidence of cardiac tamponade. Mitral Valve: The mitral valve is normal in structure. No evidence of mitral valve regurgitation. No evidence of mitral valve stenosis. MV peak gradient, 6.2 mmHg. The mean mitral valve gradient is 3.0 mmHg. Tricuspid Valve: The tricuspid valve is normal in structure. Tricuspid valve regurgitation is not demonstrated. No evidence of tricuspid stenosis. Aortic Valve: The aortic valve is normal in structure. Aortic valve regurgitation is trivial. No aortic stenosis is present. Aortic valve mean gradient measures 3.0 mmHg. Aortic valve peak gradient measures 5.2 mmHg. Aortic valve area, by VTI measures 2.79 cm. Pulmonic Valve: The pulmonic valve was normal in structure. Pulmonic valve regurgitation is trivial. No evidence of pulmonic stenosis. Aorta: The aortic root is normal in size and structure. Venous: The inferior vena cava is normal in size with greater than 50% respiratory variability, suggesting right atrial pressure of 3 mmHg. IAS/Shunts: No atrial level shunt detected by color flow Doppler.  LEFT VENTRICLE PLAX 2D LVIDd:         3.50 cm   Diastology LVIDs:         2.10 cm   LV e' medial:    4.68 cm/s LV PW:         1.20 cm   LV E/e' medial:  14.6 LV IVS:        1.60 cm   LV e' lateral:   5.87 cm/s LVOT diam:     2.00 cm   LV E/e' lateral: 11.6 LV SV:         49 LV SV Index:   27 LVOT Area:     3.14 cm LV IVRT:  108 msec  RIGHT VENTRICLE RV Basal diam:  2.80 cm RV Mid diam:     2.60 cm RV S prime:     8.81 cm/s TAPSE (M-mode): 1.8 cm LEFT ATRIUM           Index        RIGHT ATRIUM          Index LA diam:      3.00 cm 1.64 cm/m   RA Area:     9.22 cm LA Vol (A2C): 21.2 ml 11.58 ml/m  RA Volume:   15.90 ml 8.68 ml/m LA Vol (A4C): 30.4 ml 16.60 ml/m  AORTIC VALVE AV Area (Vmax):    2.76 cm AV Area (Vmean):   2.42 cm AV Area (VTI):     2.79 cm AV Vmax:           113.50 cm/s AV Vmean:          77.650 cm/s AV VTI:            0.176 m AV Peak Grad:      5.2 mmHg AV Mean Grad:      3.0 mmHg LVOT Vmax:         99.80 cm/s LVOT Vmean:        59.700 cm/s LVOT VTI:          0.157 m LVOT/AV VTI ratio: 0.89  AORTA Ao Root diam: 2.60 cm MITRAL VALVE                TRICUSPID VALVE MV Area (PHT): 12.64 cm    TR Peak grad:   8.9 mmHg MV Area VTI:   3.55 cm     TR Vmax:        149.00 cm/s MV Peak grad:  6.2 mmHg MV Mean grad:  3.0 mmHg     SHUNTS MV Vmax:       1.24 m/s     Systemic VTI:  0.16 m MV Vmean:      77.6 cm/s    Systemic Diam: 2.00 cm MV Decel Time: 60 msec MV E velocity: 68.10 cm/s MV A velocity: 108.00 cm/s MV E/A ratio:  0.63 Deatrice Cage MD Electronically signed by Deatrice Cage MD Signature Date/Time: 07/19/2024/12:56:10 PM    Final    US  Carotid Bilateral Result Date: 07/19/2024 EXAM: US  CAROTID DUPLEX 07/19/2024 07:49:44 AM TECHNIQUE: Real-time grayscale, color flow and spectral Doppler sonographic images were obtained of the extracranial carotid system using a linear transducer. COMPARISON: None available CLINICAL HISTORY: Syncope. FINDINGS: RIGHT: Common carotid artery: 84 cm/s Internal carotid artery: 81 cm/s External carotid artery: 97 cm/s Right ICA/CCA ratio: 1.0 Plaque: None Vertebral artery: Antegrade LEFT: Common carotid artery: 81 cm/s Internal carotid artery: 92 cm/s External carotid artery: 61 cm/s LEFT ICA/CCA ratio: 1.1 Plaque: None Vertebral artery: Antegrade IMPRESSION: 1. No hemodynamically significant stenosis (greater than 50%) within the extracranial  internal carotid arteries. Electronically signed by: Evalene Coho MD 07/19/2024 08:09 AM EST RP Workstation: [id number]   MR Brain W and Wo Contrast Result Date: 07/18/2024 EXAM: MRI BRAIN WITH AND WITHOUT CONTRAST 07/18/2024 07:50:28 PM TECHNIQUE: Multiplanar multisequence MRI of the head/brain was performed with and without the administration of intravenous contrast. COMPARISON: 10/06/2023 CLINICAL HISTORY: Neuro deficit, acute, stroke suspected. FINDINGS: BRAIN AND VENTRICLES: No acute infarct. No acute intracranial hemorrhage. No mass effect or midline shift. No hydrocephalus. The sella is unremarkable. Normal flow voids. Old bilateral cerebellar infarcts. Minimal nonspecific white matter T2-weighted signal hyperintensities, which may be  associated with early chronic small vessel disease or migraine headaches. ORBITS: No significant abnormality. SINUSES: No significant abnormality. BONES AND SOFT TISSUES: Normal bone marrow signal. No soft tissue abnormality. IMPRESSION: 1. No acute intracranial abnormality. 2. Old bilateral cerebellar infarcts. 3. Minimal nonspecific white matter T2-weighted signal hyperintensities, which may be seen with early chronic small vessel disease or migraine headaches. Electronically signed by: Franky Stanford MD 07/18/2024 07:59 PM EST RP Workstation: [id number]   CT Angio Chest Pulmonary Embolism (PE) W or WO Contrast Result Date: 07/18/2024 EXAM: CTA CHEST 07/18/2024 04:12:36 PM TECHNIQUE: CTA of the chest was performed after the administration of 75 mL of iohexol  (OMNIPAQUE ) 350 MG/ML injection. Multiplanar reformatted images are provided for review. MIP images are provided for review. Automated exposure control, iterative reconstruction, and/or weight based adjustment of the mA/kV was utilized to reduce the radiation dose to as low as reasonably achievable. COMPARISON: CT chest 10/06/2023. CLINICAL HISTORY: Pulmonary embolism (PE) suspected, high prob. FINDINGS: PULMONARY  ARTERIES: Pulmonary arteries are adequately opacified for evaluation. No acute pulmonary embolus. Main pulmonary artery is normal in caliber. MEDIASTINUM: Mild cardiomegaly is unchanged. The pericardium demonstrates no acute abnormality. There is no acute abnormality of the thoracic aorta. LYMPH NODES: No mediastinal, hilar or axillary lymphadenopathy. LUNGS AND PLEURA: There are minimal patchy ground glass opacities in the bilateral lower lobes. One ground glass opacity is slightly nodular in the superior segment of the right lower lobe measuring 12 mm (image 8/63). No focal consolidation or pulmonary edema. No evidence of pleural effusion or pneumothorax. UPPER ABDOMEN: Limited images of the upper abdomen are unremarkable. SOFT TISSUES AND BONES: No acute bone or soft tissue abnormality. IMPRESSION: 1. No evidence of pulmonary embolism. 2. Minimal patchy ground glass opacities in the bilateral lower lobes, including a slightly nodular ground glass opacity in the superior segment of the right lower lobe measuring 12 mm; recommend non-contrast chest CT at 6-12 months to confirm persistence, then CT every 2 years until 5 years, as per Fleischner Society Guidelines. Electronically signed by: Greig Pique MD 07/18/2024 05:08 PM EST RP Workstation: [id number]   CT Head Wo Contrast Result Date: 07/18/2024 EXAM: CT HEAD WITHOUT CONTRAST 07/18/2024 04:12:36 PM TECHNIQUE: CT of the head was performed without the administration of intravenous contrast. Automated exposure control, iterative reconstruction, and/or weight based adjustment of the mA/kV was utilized to reduce the radiation dose to as low as reasonably achievable. COMPARISON: 05/13/2024 CLINICAL HISTORY: Syncope/presyncope, cerebrovascular cause suspected. FINDINGS: BRAIN AND VENTRICLES: No acute hemorrhage. No evidence of acute infarct. No hydrocephalus. No extra-axial collection. No mass effect or midline shift. ORBITS: No acute abnormality. SINUSES: Sequelae  of prior sinus infection is present with wall thickening in the maxillary sinuses, right greater than left. No active disease is present. SOFT TISSUES AND SKULL: No acute soft tissue abnormality. No skull fracture. IMPRESSION: 1. No acute intracranial abnormality. Electronically signed by: Lonni Necessary MD 07/18/2024 04:40 PM EST RP Workstation: [id number]   DG Chest 2 View Result Date: 07/18/2024 CLINICAL DATA:  Chest pain. EXAM: CHEST - 2 VIEW COMPARISON:  12/06/2023. FINDINGS: The heart size and mediastinal contours are within normal limits. No overt pulmonary edema, focal consolidation, pleural effusion, or pneumothorax. No acute osseous abnormality. IMPRESSION: No acute cardiopulmonary findings. Electronically Signed   By: Harrietta Sherry M.D.   On: 07/18/2024 15:41      ASSESSMENT/PLAN   44 year old pleasant African-American female seen today for abnormal CT chest with 1.2 cm ground glass nodular opacity in the right lower lobe  incidentally found on a CT of the chest after being assessed in the ER for syncope and chest pain.  Patient also has a history of nonischemic cardiomyopathy   Assessment of nodular opacity Recommend repeat CT chest in 8 weeks for further evaluation  Recommend follow-up with cardiology heart failure clinic Continue Lasix  as prescribed based on weight changes  Assessment of sleep apnea next office visit  Obesity -recommend significant weight loss -recommend changing diet  Deconditioned state -Recommend increased daily activity and exercise     CURRENT MEDICATIONS REVIEWED AT LENGTH WITH PATIENT TODAY   Patient  satisfied with Plan of action and management. All questions answered   Follow up 6 weeks   I spent a total of 60 minutes dedicated to the care of this patient on the date of this encounter to include pre-visit review of records, face-to-face time with the patient discussing conditions above, post visit ordering of testing, clinical  documentation with the electronic health record, making appropriate referrals as documented, and communicating necessary information to the patient's healthcare team.    The Patient requires high complexity decision making for assessment and support, frequent evaluation and titration of therapies, application of advanced monitoring technologies and extensive interpretation of multiple databases.  Patient satisfied with Plan of action and management. All questions answered    Nickolas Alm Cellar, M.D.  [facility]  Medical Director [facility]                 [1]  Social History Tobacco Use   Smoking status: Never   Smokeless tobacco: Never  Vaping Use   Vaping status: Never Used  Substance Use Topics   Alcohol use: No   Drug use: Not Currently  [2]  Current Outpatient Medications:    Blood Glucose Monitoring Suppl DEVI, 1 each by Does not apply route 3 (three) times daily. May dispense any manufacturer covered by patient's insurance., Disp: 1 each, Rfl: 0   ferrous sulfate  325 (65 FE) MG EC tablet, Take 1 tablet (325 mg total) by mouth 2 (two) times daily., Disp: 60 tablet, Rfl: 3   furosemide  (LASIX ) 40 MG tablet, Take 1 tablet (40 mg total) by mouth as needed., Disp: 45 tablet, Rfl: 3   gabapentin  (NEURONTIN ) 300 MG capsule, Take 600 mg by mouth at bedtime., Disp: , Rfl:    Glucose Blood (BLOOD GLUCOSE TEST STRIPS) STRP, 1 each by Does not apply route 3 (three) times daily. Use as directed to check blood sugar. May dispense any manufacturer covered by patient's insurance and fits patient's device., Disp: 100 strip, Rfl: 0   ibuprofen  (ADVIL ) 800 MG tablet, Take 800 mg by mouth every 8 (eight) hours as needed., Disp: , Rfl:    insulin  aspart (NOVOLOG ) 100 UNIT/ML FlexPen, Inject 4 Units into the skin 3 (three) times daily with meals. Only take if eating a meal AND Blood Glucose (BG) is 80 or higher. (Patient taking differently: Inject  6-8 Units into the skin 3 (three) times daily with meals. Only take if eating a meal AND Blood Glucose (BG) is 80 or higher.), Disp: 3 mL, Rfl: 2   insulin  glargine (LANTUS ) 100 UNIT/ML Solostar Pen, Inject 14 Units into the skin at bedtime. May substitute as needed per insurance. (Patient taking differently: Inject 12 Units into the skin at bedtime. May substitute as needed per insurance.), Disp: 4.2 mL, Rfl: 2   insulin  glargine-yfgn (SEMGLEE ) 100 UNIT/ML Pen, Inject into the skin at bedtime., Disp: ,  Rfl:    Insulin  Pen Needle (PEN NEEDLES) 31G X 5 MM MISC, 1 each by Does not apply route 3 (three) times daily. May dispense any manufacturer covered by patient's insurance., Disp: 100 each, Rfl: 0   JARDIANCE  10 MG TABS tablet, Take 1 tablet (10 mg total) by mouth daily before breakfast., Disp: 30 tablet, Rfl: 11   Lancet Device MISC, 1 each by Does not apply route 3 (three) times daily. May dispense any manufacturer covered by patient's insurance., Disp: 1 each, Rfl: 0   Lancets MISC, 1 each by Does not apply route 3 (three) times daily. Use as directed to check blood sugar. May dispense any manufacturer covered by patient's insurance and fits patient's device., Disp: 100 each, Rfl: 0   metFORMIN  (GLUCOPHAGE ) 1000 MG tablet, Take 1,000 mg by mouth 2 (two) times daily with a meal. Breakfast & supper, Disp: , Rfl:    metoprolol  succinate (TOPROL -XL) 25 MG 24 hr tablet, Take 3 tablets (75 mg total) by mouth daily. Take with or immediately following a meal., Disp: 270 tablet, Rfl: 3   nortriptyline  (PAMELOR ) 50 MG capsule, Take 100 mg by mouth at bedtime., Disp: , Rfl:    tirzepatide  (MOUNJARO ) 2.5 MG/0.5ML Pen, Inject 2.5 mg into the skin once a week., Disp: 2 mL, Rfl: 1   Vitamin D , Ergocalciferol , (DRISDOL ) 1.25 MG (50000 UNIT) CAPS capsule, Take 50,000 Units by mouth every 7 (seven) days., Disp: , Rfl:   "

## 2024-08-24 ENCOUNTER — Other Ambulatory Visit

## 2024-09-14 ENCOUNTER — Ambulatory Visit: Admitting: Internal Medicine

## 2024-09-14 ENCOUNTER — Encounter

## 2024-09-20 ENCOUNTER — Ambulatory Visit: Admitting: Family
